# Patient Record
Sex: Male | Born: 1935
Health system: Southern US, Community
[De-identification: ages and names within clinical notes are randomized; demographics above are authoritative.]

## PROBLEM LIST (undated history)

## (undated) ENCOUNTER — Emergency Department (HOSPITAL_BASED_OUTPATIENT_CLINIC_OR_DEPARTMENT_OTHER)

## (undated) DIAGNOSIS — I10 Essential (primary) hypertension: Secondary | ICD-10-CM

## (undated) DIAGNOSIS — I700 Atherosclerosis of aorta: Secondary | ICD-10-CM

## (undated) DIAGNOSIS — E785 Hyperlipidemia, unspecified: Secondary | ICD-10-CM

## (undated) DIAGNOSIS — Z8673 Personal history of transient ischemic attack (TIA), and cerebral infarction without residual deficits: Secondary | ICD-10-CM

## (undated) DIAGNOSIS — H269 Unspecified cataract: Secondary | ICD-10-CM

## (undated) DIAGNOSIS — D510 Vitamin B12 deficiency anemia due to intrinsic factor deficiency: Secondary | ICD-10-CM

## (undated) DIAGNOSIS — I739 Peripheral vascular disease, unspecified: Secondary | ICD-10-CM

## (undated) DIAGNOSIS — Z87442 Personal history of urinary calculi: Secondary | ICD-10-CM

## (undated) DIAGNOSIS — N400 Enlarged prostate without lower urinary tract symptoms: Secondary | ICD-10-CM

## (undated) DIAGNOSIS — I2510 Atherosclerotic heart disease of native coronary artery without angina pectoris: Secondary | ICD-10-CM

## (undated) HISTORY — DX: Hyperlipidemia, unspecified: E78.5

## (undated) HISTORY — PX: CATARACT EXTRACTION W/ INTRAOCULAR LENS IMPLANT: SHX1309

## (undated) HISTORY — DX: Atherosclerosis of aorta: I70.0

## (undated) HISTORY — DX: Unspecified cataract: H26.9

## (undated) HISTORY — DX: Personal history of transient ischemic attack (TIA), and cerebral infarction without residual deficits: Z86.73

## (undated) HISTORY — PX: MELANOMA EXCISION: SHX5266

## (undated) HISTORY — DX: Essential (primary) hypertension: I10

## (undated) HISTORY — PX: CHOLECYSTECTOMY: SHX55

## (undated) HISTORY — DX: Atherosclerotic heart disease of native coronary artery without angina pectoris: I25.10

## (undated) HISTORY — DX: Peripheral vascular disease, unspecified: I73.9

## (undated) HISTORY — DX: Vitamin B12 deficiency anemia due to intrinsic factor deficiency: D51.0

## (undated) HISTORY — PX: CAROTID ARTERY ANGIOPLASTY: SHX1300

## (undated) HISTORY — DX: Benign prostatic hyperplasia without lower urinary tract symptoms: N40.0

---

## 1992-08-23 DIAGNOSIS — H269 Unspecified cataract: Secondary | ICD-10-CM | POA: Insufficient documentation

## 1992-08-23 HISTORY — DX: Unspecified cataract: H26.9

## 2009-08-23 DIAGNOSIS — Z8673 Personal history of transient ischemic attack (TIA), and cerebral infarction without residual deficits: Secondary | ICD-10-CM

## 2009-08-23 HISTORY — DX: Personal history of transient ischemic attack (TIA), and cerebral infarction without residual deficits: Z86.73

## 2010-02-09 ENCOUNTER — Ambulatory Visit: Payer: Self-pay | Admitting: Surgery

## 2010-02-16 ENCOUNTER — Ambulatory Visit: Payer: Self-pay | Admitting: Surgery

## 2010-02-16 ENCOUNTER — Encounter: Admission: RE | Admit: 2010-02-16 | Discharge: 2010-02-16 | Payer: Self-pay | Admitting: Surgery

## 2010-02-20 DIAGNOSIS — I779 Disorder of arteries and arterioles, unspecified: Secondary | ICD-10-CM

## 2010-02-20 HISTORY — DX: Disorder of arteries and arterioles, unspecified: I77.9

## 2010-03-06 ENCOUNTER — Ambulatory Visit: Payer: Self-pay | Admitting: Surgery

## 2010-03-06 ENCOUNTER — Inpatient Hospital Stay (HOSPITAL_COMMUNITY): Admission: RE | Admit: 2010-03-06 | Discharge: 2010-03-07 | Payer: Self-pay | Admitting: Surgery

## 2010-03-06 ENCOUNTER — Encounter: Payer: Self-pay | Admitting: Surgery

## 2010-03-06 HISTORY — PX: CAROTID ENDARTERECTOMY: SUR193

## 2010-03-23 ENCOUNTER — Ambulatory Visit: Payer: Self-pay | Admitting: Surgery

## 2010-03-30 ENCOUNTER — Encounter: Admission: RE | Admit: 2010-03-30 | Discharge: 2010-03-30 | Payer: Self-pay | Admitting: Surgery

## 2010-03-30 ENCOUNTER — Ambulatory Visit: Payer: Self-pay | Admitting: Surgery

## 2010-04-13 ENCOUNTER — Ambulatory Visit: Payer: Self-pay | Admitting: Surgery

## 2010-10-12 ENCOUNTER — Ambulatory Visit: Payer: Self-pay | Admitting: Surgery

## 2010-10-12 ENCOUNTER — Other Ambulatory Visit: Payer: Self-pay

## 2010-10-19 ENCOUNTER — Other Ambulatory Visit: Payer: Self-pay | Admitting: Surgery

## 2010-10-19 ENCOUNTER — Other Ambulatory Visit (INDEPENDENT_AMBULATORY_CARE_PROVIDER_SITE_OTHER): Payer: Medicare Other

## 2010-10-19 ENCOUNTER — Ambulatory Visit (INDEPENDENT_AMBULATORY_CARE_PROVIDER_SITE_OTHER): Payer: Medicare Other | Admitting: Surgery

## 2010-10-19 DIAGNOSIS — Z48812 Encounter for surgical aftercare following surgery on the circulatory system: Secondary | ICD-10-CM

## 2010-10-19 DIAGNOSIS — I6529 Occlusion and stenosis of unspecified carotid artery: Secondary | ICD-10-CM

## 2010-10-19 DIAGNOSIS — R52 Pain, unspecified: Secondary | ICD-10-CM

## 2010-10-20 ENCOUNTER — Other Ambulatory Visit (HOSPITAL_COMMUNITY): Payer: Medicare Other

## 2010-10-20 ENCOUNTER — Inpatient Hospital Stay (HOSPITAL_COMMUNITY): Admission: RE | Admit: 2010-10-20 | Payer: Medicare Other | Source: Ambulatory Visit

## 2010-10-20 NOTE — Assessment & Plan Note (Signed)
OFFICE VISIT  SAGAN, MASELLI DOB:  1933/09/04                                       10/19/2010 [id number]#:[id number]  REASON FOR VISIT:  Followup carotid.  The patient is a 77 year old gentleman who underwent left carotid endarterectomy for symptomatic stenosis.  His postoperative course was uncomplicated.  He comes back in today for followup.  He states that for the past couple of weeks he has been having some numbness in his right hand as well as some pain in his forearm.  The episodes last for a short duration and go away spontaneously.  There are no aggravating factors. They happen almost on a daily occurrence now.  PHYSICAL EXAM:  Vital signs:  Heart rate is 83, blood pressure 145/76 in the right, 125/71 in the left, O2 sat 97%.  General:  Well-appearing, in no distress.  HEENT:  Within normal limits.  Left carotid incision is well-healed.  Respirations:  Nonlabored.  Neurological:  He is intact.  DIAGNOSTIC STUDIES:  Carotid duplex was performed today which shows normal right and left carotid arteries.  ASSESSMENT AND PLAN:  Status post left carotid endarterectomy for symptomatic disease.  The patient's post duplex is widely patent without stenosis.  I am a little troubled by the symptoms he is complaining of in his right arm.  While these are not classic for TIAs it certainly raises a question that could be a potential explanation.  With his left carotid artery widely patent I am going to order a CT scan of his head and neck to make sure there are no potential causative lesions.  I had to do this several months ago and it was negative.  Should this return negative again I would recommend that he see his primary care physician to discuss possible cervical disk disease as the etiology as he says he does have occasional neck pain and this may fit the description of his complaints.  I will call him with the results of the scan.  He is going to be placed  on our carotid surveillance ultrasound.    Jorge Ny, MD Electronically Signed  VWB/MEDQ  D:  10/19/2010  T:  10/20/2010  Job:  [id number]  cc:   Dr Luna Kitchens

## 2010-10-21 ENCOUNTER — Other Ambulatory Visit: Payer: Self-pay | Admitting: Surgery

## 2010-10-21 DIAGNOSIS — I6529 Occlusion and stenosis of unspecified carotid artery: Secondary | ICD-10-CM

## 2010-10-26 ENCOUNTER — Inpatient Hospital Stay: Admission: RE | Admit: 2010-10-26 | Payer: Medicare Other | Source: Ambulatory Visit

## 2010-10-27 NOTE — Procedures (Unsigned)
CAROTID DUPLEX EXAM  INDICATION:  Followup left CEA.  HISTORY: Diabetes:  No. Cardiac:  No. Hypertension:  Yes. Smoking:  No. Previous Surgery:  Left CEA 03/06/2010. CV History:  TIA. Amaurosis Fugax No, Paresthesias No, Hemiparesis No                                      RIGHT             LEFT Brachial systolic pressure:         152               152 Brachial Doppler waveforms:         WNL               WNL Vertebral direction of flow:        Antegrade         Antegrade DUPLEX VELOCITIES (cm/sec) CCA peak systolic                   78                94 ECA peak systolic                   96                101 ICA peak systolic                   53                95 ICA end diastolic                   16                28 PLAQUE MORPHOLOGY:                  Heterogeneous     N/A PLAQUE AMOUNT:                      Minimal           N/A PLAQUE LOCATION:                    ICA               N/A  IMPRESSION: 1. 1% to 39% right internal carotid artery plaquing. 2. Widely patent left carotid endarterectomy with normal post surgical     changes observed. 3. Bilateral vertebral arteries are within normal limits.  ___________________________________________ V. Charlena Cross, MD  LT/MEDQ  D:  10/19/2010  T:  10/19/2010  Job:  [id number]

## 2010-11-07 LAB — CBC
Hemoglobin: 11.4 g/dL — ABNORMAL LOW (ref 13.0–17.0)
MCH: 32 pg (ref 26.0–34.0)
MCHC: 34.3 g/dL (ref 30.0–36.0)
RBC: 3.54 MIL/uL — ABNORMAL LOW (ref 4.22–5.81)
RDW: 12.7 % (ref 11.5–15.5)

## 2010-11-07 LAB — BASIC METABOLIC PANEL
BUN: 10 mg/dL (ref 6–23)
CO2: 29 mEq/L (ref 19–32)
Calcium: 8.3 mg/dL — ABNORMAL LOW (ref 8.4–10.5)
Chloride: 101 mEq/L (ref 96–112)
GFR calc Af Amer: >60 mL/min (ref >60)
Glucose, Bld: 142 mg/dL — ABNORMAL HIGH (ref 70–99)

## 2010-11-08 LAB — CBC
HCT: 42.8 % (ref 39.0–52.0)
MCHC: 34.5 g/dL (ref 30.0–36.0)
MCV: 92.9 fL (ref 78.0–100.0)
RBC: 4.61 MIL/uL (ref 4.22–5.81)
RDW: 13.3 % (ref 11.5–15.5)
WBC: 9.3 10*3/uL (ref 4.0–10.5)

## 2010-11-08 LAB — URINALYSIS, ROUTINE W REFLEX MICROSCOPIC
Glucose, UA: NEGATIVE mg/dL
Nitrite: NEGATIVE
Protein, ur: NEGATIVE mg/dL
Specific Gravity, Urine: 1.007 (ref 1.005–1.030)
Urobilinogen, UA: 0.2 mg/dL (ref 0.0–1.0)

## 2010-11-08 LAB — SURGICAL PCR SCREEN: MRSA, PCR: NEGATIVE

## 2010-11-08 LAB — COMPREHENSIVE METABOLIC PANEL
ALT: 20 U/L (ref 0–53)
Albumin: 4.3 g/dL (ref 3.5–5.2)
CO2: 30 mEq/L (ref 19–32)
Calcium: 9.1 mg/dL (ref 8.4–10.5)
Chloride: 98 mEq/L (ref 96–112)
Creatinine, Ser: 0.91 mg/dL (ref 0.4–1.5)
GFR calc non Af Amer: >60 mL/min (ref >60)
Total Bilirubin: 0.7 mg/dL (ref 0.3–1.2)

## 2010-11-08 LAB — APTT: aPTT: 28 seconds (ref 24–37)

## 2010-11-08 LAB — PROTIME-INR: INR: 0.88 (ref 0.00–1.49)

## 2010-11-08 LAB — ABO/RH: ABO/RH(D): O POS

## 2011-01-05 NOTE — Assessment & Plan Note (Signed)
OFFICE VISIT   Joseph Mcmillan, Joseph Mcmillan  DOB:  1934-06-02                                       02/09/2010  EAVWU#:[id number]   HISTORY:  This is a 77 year old gentleman I am seeing at the request Dr.  Welton Flakes for evaluation of TIA/carotid stenosis.  The patient's history  dates back to 2007 where he had an  episode of right arm weakness.  This  resolved spontaneously.  More recently, however, in May 2011, he  presented to emergency department with acute onset of right hand  numbness and right-sided facial weakness.  A CT scan performed which was  within normal limits.  His symptoms resolved spontaneously but then  recurred.  He had a carotid duplex performed which revealed 60-70% left  carotid stenosis.  There was less than 50% right carotid stenosis.  He  was sent to me for further evaluation.  He denies having any symptoms  since he has been discharged.  No amaurosis, no numbness or weakness.  No slurring of speech.   REVIEW OF SYSTEMS:  Only positive for mini stroke.  All other review  systems are negative.  No chest pain or shortness of breath.   PAST MEDICAL HISTORY:  Hypercholesterolemia, hypertension and benign  prostatic hypertrophy.   FAMILY HISTORY:  Negative for cardiovascular disease at an early age.   DATA REVIEWED:  He has 2 children.  Retired.  Does not smoke or drink.   MEDICATIONS:  Include aspirin 81 mg per day, Diovan HCT 160/12.5 once  daily, Plavix 75 mg per day, simvastatin daily, fish oil 1000 mg daily.   PHYSICAL EXAMINATION:  Heart rate 59, blood pressure 146/71, temperature  is 99.0.  GENERAL:  He is well-appearing, in no distress.  HEENT:  Within normal limits.  LUNGS:  Clear bilaterally.  CARDIOVASCULAR:  Regular rate and rhythm.  No murmur.  No carotid bruit.  ABDOMEN:  Soft, nontender.  MUSCULOSKELETAL:  Without major deformity.  NEURO:  He has no focal deficits.  Cranial nerves II-XII are grossly  intact.  SKIN:  Without rash.   ASSESSMENT/PLAN:  Moderate left carotid stenosis in the setting of left  brain stroke.   PLAN:  The patient has undergone a thorough stroke workup.  The only  thing positive that I can identify this time his carotid stenosis in the  50-70% range.  He certainly falls in the category where I would  recommend carotid endarterectomy.  However, I would also like to fully  evaluate his aortic arch to make sure this is not an aortic plaque that  is embolizing.  I plan on getting a CT angiogram of the chest and neck  to evaluate this and having the patient come back see me next week for  further discussions and plans for operative intervention.     Jorge Ny, MD  Electronically Signed   VWB/MEDQ  D:  02/09/2010  T:  02/10/2010  Job:  [id number]

## 2011-01-05 NOTE — Assessment & Plan Note (Signed)
OFFICE VISIT   EDI, GORNIAK  DOB:  1934/01/13                                       02/16/2010  AVWUJ#:[id number]   Patient comes back in today for further discussions of carotid stenosis.  Briefly he has had a history of right arm weakness back in 2007; however  more recently, he had an episode of right hand numbness and right facial  weakness with slurring of his speech in May 2011.  A head CT was  negative, but carotid Dopplers revealed 60% to 70% left carotid  stenosis.  Diagnosis having had a left brain stroke.  With his Doppler  findings I wanted to get further information about his stenosis;  therefore, I ordered a CT angiogram.  He comes back in today to discuss  the results.  He has not had any symptoms since his visit.   Review of systems are negative as documented in the encounter form  except with a history of mini stroke in May.  He is an avid exercise  person.   PAST MEDICAL HISTORY:  Hypercholesterolemia, hypertension, BPH.   FAMILY HISTORY:  Negative for cardiovascular disease at an early age.   SOCIAL HISTORY:  Negative for tobacco or alcohol.   PHYSICAL EXAMINATION:  Vital signs:  Heart rate 80, blood pressure  116/74, O2 sats 98%.  General:  He is well-appearing in no distress.  HEENT:  Within normal limits.  Lungs are clear bilaterally.  Cardiovascular:  Regular rate and rhythm.  Abdomen:  Soft.  Skin without  rash.  Neuro:  Nonfocal.   DIAGNOSTIC STUDIES:  I have independently reviewed his CT scan.  This  reveals 70% to 75% stenosis in the proximal left internal carotid  artery.  There is mild to minimal plaque in the aortic arch.  The right  side has a stenosis of approximately 30%.   ASSESSMENT:  Symptomatic left carotid stenosis.   PLAN:  I spent an excess of 35 minutes discussing the risks and pro's  and con's of proceeding with the operation.  We discussed the risk of  stroke, nerve injury, cardiopulmonary risks.  The  patient, despite  lengthy conversation, is yet to make a decision.  I have tentatively put  him on the OR schedule for Friday, July 8th, for left carotid  endarterectomy.  He will continue his aspirin and Plavix.  I am going to  get a stress test in [place] for preoperative clearance.     Jorge Ny, MD  Electronically Signed   VWB/MEDQ  D:  02/16/2010  T:  02/17/2010  Job:  [id number]   cc:   Dr. Welton Flakes

## 2011-01-05 NOTE — Assessment & Plan Note (Signed)
OFFICE VISIT   SHAW, Joseph Mcmillan  DOB:  [birth year]                                       04/13/2010  AVWUJ#:[id number]   The patient comes back in today.  He said that he had some drainage from  the distal aspect of his left carotid incision.  It had developed some  redness as well.  He comes in today.  I was able to express a mild  amount of serosanguineous fluid.  The wound was opened approximately 2  mm.  It did not tunnel anywhere.  I evacuated a small amount of old  hematoma.  I placed a piece of gauze into the wound and I am going to  give him a 10 day course of antibiotics.  He will come back to see me in  2 weeks.     Jorge Ny, MD  Electronically Signed   VWB/MEDQ  D:  04/13/2010  T:  04/14/2010  Job:  [phone]

## 2011-01-05 NOTE — Assessment & Plan Note (Signed)
OFFICE VISIT   FRANKEY, BOTTING  DOB:  1934-06-30                                       03/30/2010  EAVWU#:[id number]   This is a 77 year old gentleman status post left carotid endarterectomy  for symptomatic stenosis.  Operative findings include an 80% stenosis  with significant ulcerated plaque.  Patient's postoperative course was  uncomplicated.  He comes back today for followup.   When I saw him last week, he had 1 episode of trouble getting his words  out, which lasted for 1 minute.  He had no other neurologic symptoms.  I  was concerned that this could represent a TIA; therefore, I ordered a  CTA of the head and neck to exclude intracranial disease and aortic arch  disease.  He has not had any further symptoms.  His CTA was negative for  any concerning lesions.  At this point, I am recommending he stay on his  aspirin and Plavix.  I will see him back in 6 months for followup.     Jorge Ny, MD  Electronically Signed   VWB/MEDQ  D:  03/30/2010  T:  03/31/2010  Job:  [id number]   cc:   Dr. Luna Kitchens

## 2011-01-05 NOTE — Assessment & Plan Note (Signed)
OFFICE VISIT   CRUZE, ZINGARO  DOB:  1934/01/01                                       03/23/2010  WJXBJ#:[id number]   The patient comes back today.  He underwent left carotid endarterectomy  on 07/15 for symptomatic stenosis.  Operative findings included an  approximately 80% stenosis with significant ulcerated plaque.  The  patient's postoperative course was relatively uncomplicated, comes back  in today for followup.  He does tell me that he had 1 episode which last  approximately 1 minute where he had trouble getting his words out.  He  has had no other neurologic symptoms.   On physical exam he is neurologically intact.  His incision is well-  healed.   Based on the patient's symptomatology, I feel we need to proceed with  further diagnostic testing.  I am ordering a CT angiogram of his head  and neck mainly to look for intracranial disease.  I am going to have  this done this coming Monday and I will see him afterward.  He will  continue his Plavix.     Jorge Ny, MD  Electronically Signed   VWB/MEDQ  D:  03/23/2010  T:  03/24/2010  Job:  [id number]

## 2011-08-24 DIAGNOSIS — H268 Other specified cataract: Secondary | ICD-10-CM

## 2011-08-24 DIAGNOSIS — Z961 Presence of intraocular lens: Secondary | ICD-10-CM | POA: Insufficient documentation

## 2011-08-24 DIAGNOSIS — H53019 Deprivation amblyopia, unspecified eye: Secondary | ICD-10-CM | POA: Insufficient documentation

## 2011-08-24 DIAGNOSIS — H26491 Other secondary cataract, right eye: Secondary | ICD-10-CM | POA: Insufficient documentation

## 2011-08-24 DIAGNOSIS — H43819 Vitreous degeneration, unspecified eye: Secondary | ICD-10-CM | POA: Insufficient documentation

## 2011-08-24 DIAGNOSIS — H02839 Dermatochalasis of unspecified eye, unspecified eyelid: Secondary | ICD-10-CM | POA: Insufficient documentation

## 2011-08-24 HISTORY — DX: Deprivation amblyopia, unspecified eye: H53.019

## 2011-08-24 HISTORY — DX: Vitreous degeneration, unspecified eye: H43.819

## 2011-08-24 HISTORY — DX: Other secondary cataract, right eye: H26.491

## 2011-08-24 HISTORY — DX: Presence of intraocular lens: Z96.1

## 2011-08-24 HISTORY — DX: Dermatochalasis of unspecified eye, unspecified eyelid: H02.839

## 2011-08-24 HISTORY — DX: Other specified cataract: H26.8

## 2011-10-21 ENCOUNTER — Encounter: Payer: Self-pay | Admitting: Surgery

## 2011-10-22 ENCOUNTER — Encounter: Payer: Self-pay | Admitting: Surgery

## 2011-10-25 ENCOUNTER — Ambulatory Visit (INDEPENDENT_AMBULATORY_CARE_PROVIDER_SITE_OTHER): Payer: Medicare Other | Admitting: Surgery

## 2011-10-25 ENCOUNTER — Other Ambulatory Visit (INDEPENDENT_AMBULATORY_CARE_PROVIDER_SITE_OTHER): Payer: Medicare Other | Admitting: *Deleted

## 2011-10-25 DIAGNOSIS — I6529 Occlusion and stenosis of unspecified carotid artery: Secondary | ICD-10-CM

## 2011-10-25 DIAGNOSIS — Z48812 Encounter for surgical aftercare following surgery on the circulatory system: Secondary | ICD-10-CM

## 2011-11-01 ENCOUNTER — Other Ambulatory Visit: Payer: Self-pay | Admitting: *Deleted

## 2011-11-01 ENCOUNTER — Encounter: Payer: Self-pay | Admitting: Surgery

## 2011-11-01 DIAGNOSIS — Z48812 Encounter for surgical aftercare following surgery on the circulatory system: Secondary | ICD-10-CM

## 2011-11-01 DIAGNOSIS — I6529 Occlusion and stenosis of unspecified carotid artery: Secondary | ICD-10-CM

## 2011-11-01 NOTE — Procedures (Unsigned)
CAROTID DUPLEX EXAM  INDICATION:  Followup left carotid endarterectomy.  HISTORY: Diabetes:  No Cardiac:  No Hypertension:  Yes Smoking:  No Previous Surgery:  Left carotid endarterectomy 03/06/2010 CV History:  TIA, current asymptomatic. Amaurosis Fugax: No. Paresthesias: No. Hemiparesis: No.                                      RIGHT             LEFT Brachial systolic pressure:         120               119 Brachial Doppler waveforms:         Normal            Normal Vertebral direction of flow:        Antegrade         Antegrade DUPLEX VELOCITIES (cm/sec) CCA peak systolic                   68                82 ECA peak systolic                   105               90 ICA peak systolic                   83                61 ICA end diastolic                   36                25 PLAQUE MORPHOLOGY:                  Heterogenous PLAQUE AMOUNT:                      Minimal           None PLAQUE LOCATION:                    ICA  IMPRESSION: 1. Right ICA velocity suggests 1-39% stenosis. 2. Patent left CEA site with no evidence of restenosis of the ICA. 3. Antegrade vertebral arteries bilaterally.  ___________________________________________ V. Charlena Cross, MD  EM/MEDQ  D:  10/25/2011  T:  10/25/2011  Job:  [id number]

## 2011-11-01 NOTE — Procedures (Unsigned)
CAROTID DUPLEX EXAM  INDICATION:  Followup left CEA.  HISTORY: Diabetes:  no Cardiac:  no Hypertension:  yes Smoking:  no Previous Surgery:  Left CEA 03/06/2010 CV History:  History of TIA, currently asymptomatic Amaurosis Fugax No, Paresthesias No, Hemiparesis No                                      RIGHT             LEFT Brachial systolic pressure:         120               119 Brachial Doppler waveforms:         Normal            Normal Vertebral direction of flow:        Antegrade         Antegrade DUPLEX VELOCITIES (cm/sec) CCA peak systolic                   68                82 ECA peak systolic                   105               90 ICA peak systolic                   83                61 ICA end diastolic                   36                25 PLAQUE MORPHOLOGY:                  Heterogenous PLAQUE AMOUNT:                      Minimal           None PLAQUE LOCATION:                    ICA  IMPRESSION:  Right ICA velocity suggests 1% to 39% stenosis.  Patent left carotid endarterectomy site with no evidence of restenosis of the ICA.  Antegrade vertebral arteries bilaterally.  ___________________________________________ V. Charlena Cross, MD  EM/MEDQ  D:  10/29/2011  T:  10/29/2011  Job:  [id number]

## 2012-10-30 ENCOUNTER — Encounter: Payer: Self-pay | Admitting: Surgery

## 2012-10-30 ENCOUNTER — Other Ambulatory Visit (INDEPENDENT_AMBULATORY_CARE_PROVIDER_SITE_OTHER): Payer: Medicare Other | Admitting: *Deleted

## 2012-10-30 ENCOUNTER — Ambulatory Visit (INDEPENDENT_AMBULATORY_CARE_PROVIDER_SITE_OTHER): Payer: Medicare Other | Admitting: Surgery

## 2012-10-30 DIAGNOSIS — I6529 Occlusion and stenosis of unspecified carotid artery: Secondary | ICD-10-CM

## 2012-10-30 DIAGNOSIS — Z48812 Encounter for surgical aftercare following surgery on the circulatory system: Secondary | ICD-10-CM

## 2012-10-30 HISTORY — DX: Occlusion and stenosis of unspecified carotid artery: I65.29

## 2012-10-30 NOTE — Progress Notes (Signed)
[facility]   Patient name: Joseph Mcmillan MRN: [id number] DOB: 1934/05/30 Sex: male     Chief Complaint  Patient presents with  . Carotid    1 year f/u     HISTORY OF PRESENT ILLNESS: The patient is back today for followup. He is status post left carotid endarterectomy on 03/06/2010. This was done in the setting of symptomatic left carotid stenosis. Prior to his surgery, he had had several episodes consistent with a TIA. These included right facial weakness and slurred speech. Since his operation, he has not had any further neurologic issues. He denies numbness or weakness in either extremity. He denies slurred speech. He denies amaurosis fugax. He continues to work part-time.  Past Medical History  Diagnosis Date  . Hypertension   . Hyperlipidemia   . Stroke     History of TIA  . CAD (coronary artery disease)   . BPH (benign prostatic hypertrophy)     Past Surgical History  Procedure Laterality Date  . Carotid endarterectomy  03-06-2010    Left CEA    History   Social History  . Marital Status: Divorced    Spouse Name: N/A    Number of Children: N/A  . Years of Education: N/A   Occupational History  . Not on file.   Social History Main Topics  . Smoking status: Never Smoker   . Smokeless tobacco: Never Used  . Alcohol Use: No  . Drug Use: No  . Sexually Active: Not on file   Other Topics Concern  . Not on file   Social History Narrative  . No narrative on file    Family History  Problem Relation Age of Onset  . Cancer Mother     Allergies as of 10/30/2012 - Review Complete 10/30/2012  Allergen Reaction Noted  . Codeine  10/21/2011    Current Outpatient Prescriptions on File Prior to Visit  Medication Sig Dispense Refill  . aspirin 81 MG tablet Take 81 mg by mouth daily.      . clopidogrel (PLAVIX) 75 MG tablet Take 75 mg by mouth daily.      . simvastatin (ZOCOR) 40 MG tablet Take 40 mg by mouth every evening.      .  valsartan-hydrochlorothiazide (DIOVAN-HCT) 160-12.5 MG per tablet Take 1 tablet by mouth daily.      . fish oil-omega-3 fatty acids 1000 MG capsule Take 1 g by mouth daily.       No current facility-administered medications on file prior to visit.     REVIEW OF SYSTEMS: Please see history of present illness, otherwise all systems are negative  PHYSICAL EXAMINATION:   Vital signs are BP 143/79  Pulse 72  Ht 5\' 11"  (1.803 m)  Wt 156 lb (70.761 kg)  BMI 21.77 kg/m2  SpO2 100% General: The patient appears their stated age. HEENT:  No gross abnormalities Pulmonary:  Non labored breathing Abdomen: Soft and non-tender. No pulsatile mass appreciated Musculoskeletal: There are no major deformities. Neurologic: No focal weakness or paresthesias are detected, Skin: There are no ulcer or rashes noted. Psychiatric: The patient has normal affect. Cardiovascular: There is a regular rate and rhythm without significant murmur appreciated. No carotid bruit   Diagnostic Studies  duplex ultrasound was ordered and reviewed. This shows less than 40% stenosis of the right carotid artery a widely patent left carotid endarterectomy site without stenosis  Assessment: Status post left carotid endarterectomy for symptomatic stenosis Plan: The patient is doing  very well at this time. He remained asymptomatic. Ultrasound shows a widely patent endarterectomy site. He will continue with routine ultrasound surveillance. His next study will be in one year  V. Charlena Cross, M.D. [facility] [employer] Office: [phone] Pager:  [phone]

## 2012-11-21 ENCOUNTER — Other Ambulatory Visit: Payer: Self-pay | Admitting: *Deleted

## 2013-10-29 ENCOUNTER — Other Ambulatory Visit (HOSPITAL_COMMUNITY): Payer: Medicare Other

## 2013-10-29 ENCOUNTER — Ambulatory Visit: Payer: Medicare Other | Admitting: Family

## 2013-10-30 ENCOUNTER — Other Ambulatory Visit: Payer: Medicare Other

## 2013-10-30 ENCOUNTER — Ambulatory Visit: Payer: Medicare Other | Admitting: Neurosurgery

## 2014-04-17 ENCOUNTER — Encounter: Payer: Self-pay | Admitting: Gastroenterology

## 2014-04-17 HISTORY — PX: COLONOSCOPY: SHX174

## 2014-09-24 DIAGNOSIS — L853 Xerosis cutis: Secondary | ICD-10-CM | POA: Diagnosis not present

## 2014-09-24 DIAGNOSIS — B079 Viral wart, unspecified: Secondary | ICD-10-CM | POA: Diagnosis not present

## 2014-09-24 DIAGNOSIS — L570 Actinic keratosis: Secondary | ICD-10-CM | POA: Diagnosis not present

## 2014-09-24 DIAGNOSIS — L578 Other skin changes due to chronic exposure to nonionizing radiation: Secondary | ICD-10-CM | POA: Diagnosis not present

## 2014-09-24 DIAGNOSIS — C4339 Malignant melanoma of other parts of face: Secondary | ICD-10-CM | POA: Diagnosis not present

## 2014-10-11 DIAGNOSIS — I10 Essential (primary) hypertension: Secondary | ICD-10-CM | POA: Diagnosis not present

## 2014-10-11 DIAGNOSIS — E119 Type 2 diabetes mellitus without complications: Secondary | ICD-10-CM | POA: Diagnosis not present

## 2015-04-03 DIAGNOSIS — M5413 Radiculopathy, cervicothoracic region: Secondary | ICD-10-CM | POA: Diagnosis not present

## 2015-04-03 DIAGNOSIS — M9902 Segmental and somatic dysfunction of thoracic region: Secondary | ICD-10-CM | POA: Diagnosis not present

## 2015-04-03 DIAGNOSIS — M9901 Segmental and somatic dysfunction of cervical region: Secondary | ICD-10-CM | POA: Diagnosis not present

## 2015-04-03 DIAGNOSIS — M9903 Segmental and somatic dysfunction of lumbar region: Secondary | ICD-10-CM | POA: Diagnosis not present

## 2015-04-08 DIAGNOSIS — M9901 Segmental and somatic dysfunction of cervical region: Secondary | ICD-10-CM | POA: Diagnosis not present

## 2015-04-08 DIAGNOSIS — M9902 Segmental and somatic dysfunction of thoracic region: Secondary | ICD-10-CM | POA: Diagnosis not present

## 2015-04-08 DIAGNOSIS — M9903 Segmental and somatic dysfunction of lumbar region: Secondary | ICD-10-CM | POA: Diagnosis not present

## 2015-04-08 DIAGNOSIS — M5413 Radiculopathy, cervicothoracic region: Secondary | ICD-10-CM | POA: Diagnosis not present

## 2015-04-16 DIAGNOSIS — R5383 Other fatigue: Secondary | ICD-10-CM | POA: Diagnosis not present

## 2015-04-22 DIAGNOSIS — M9903 Segmental and somatic dysfunction of lumbar region: Secondary | ICD-10-CM | POA: Diagnosis not present

## 2015-04-22 DIAGNOSIS — M9902 Segmental and somatic dysfunction of thoracic region: Secondary | ICD-10-CM | POA: Diagnosis not present

## 2015-04-22 DIAGNOSIS — M9901 Segmental and somatic dysfunction of cervical region: Secondary | ICD-10-CM | POA: Diagnosis not present

## 2015-04-22 DIAGNOSIS — M5413 Radiculopathy, cervicothoracic region: Secondary | ICD-10-CM | POA: Diagnosis not present

## 2015-05-20 DIAGNOSIS — M5413 Radiculopathy, cervicothoracic region: Secondary | ICD-10-CM | POA: Diagnosis not present

## 2015-05-20 DIAGNOSIS — M9902 Segmental and somatic dysfunction of thoracic region: Secondary | ICD-10-CM | POA: Diagnosis not present

## 2015-05-20 DIAGNOSIS — M9903 Segmental and somatic dysfunction of lumbar region: Secondary | ICD-10-CM | POA: Diagnosis not present

## 2015-05-20 DIAGNOSIS — M9901 Segmental and somatic dysfunction of cervical region: Secondary | ICD-10-CM | POA: Diagnosis not present

## 2015-05-26 DIAGNOSIS — B079 Viral wart, unspecified: Secondary | ICD-10-CM | POA: Diagnosis not present

## 2015-05-26 DIAGNOSIS — L570 Actinic keratosis: Secondary | ICD-10-CM | POA: Diagnosis not present

## 2015-05-26 DIAGNOSIS — L578 Other skin changes due to chronic exposure to nonionizing radiation: Secondary | ICD-10-CM | POA: Diagnosis not present

## 2015-05-26 DIAGNOSIS — L821 Other seborrheic keratosis: Secondary | ICD-10-CM | POA: Diagnosis not present

## 2015-05-28 DIAGNOSIS — Z23 Encounter for immunization: Secondary | ICD-10-CM | POA: Diagnosis not present

## 2015-09-17 DIAGNOSIS — R202 Paresthesia of skin: Secondary | ICD-10-CM | POA: Diagnosis not present

## 2015-09-17 DIAGNOSIS — R112 Nausea with vomiting, unspecified: Secondary | ICD-10-CM | POA: Diagnosis not present

## 2015-10-10 DIAGNOSIS — H527 Unspecified disorder of refraction: Secondary | ICD-10-CM | POA: Diagnosis not present

## 2015-10-10 DIAGNOSIS — Z961 Presence of intraocular lens: Secondary | ICD-10-CM | POA: Diagnosis not present

## 2015-10-10 DIAGNOSIS — H02836 Dermatochalasis of left eye, unspecified eyelid: Secondary | ICD-10-CM | POA: Diagnosis not present

## 2015-10-10 DIAGNOSIS — Z885 Allergy status to narcotic agent status: Secondary | ICD-10-CM | POA: Diagnosis not present

## 2015-10-10 DIAGNOSIS — Z7982 Long term (current) use of aspirin: Secondary | ICD-10-CM | POA: Diagnosis not present

## 2015-10-10 DIAGNOSIS — H5442 Blindness, left eye, normal vision right eye: Secondary | ICD-10-CM | POA: Diagnosis not present

## 2015-10-10 DIAGNOSIS — H509 Unspecified strabismus: Secondary | ICD-10-CM | POA: Diagnosis not present

## 2015-10-10 DIAGNOSIS — H43811 Vitreous degeneration, right eye: Secondary | ICD-10-CM | POA: Diagnosis not present

## 2015-10-10 DIAGNOSIS — H269 Unspecified cataract: Secondary | ICD-10-CM | POA: Diagnosis not present

## 2015-10-10 DIAGNOSIS — H26491 Other secondary cataract, right eye: Secondary | ICD-10-CM | POA: Diagnosis not present

## 2015-10-10 DIAGNOSIS — H53012 Deprivation amblyopia, left eye: Secondary | ICD-10-CM | POA: Diagnosis not present

## 2015-10-10 DIAGNOSIS — H02833 Dermatochalasis of right eye, unspecified eyelid: Secondary | ICD-10-CM | POA: Diagnosis not present

## 2015-10-10 DIAGNOSIS — H43813 Vitreous degeneration, bilateral: Secondary | ICD-10-CM | POA: Diagnosis not present

## 2015-10-10 DIAGNOSIS — Z79899 Other long term (current) drug therapy: Secondary | ICD-10-CM | POA: Diagnosis not present

## 2015-10-10 DIAGNOSIS — I10 Essential (primary) hypertension: Secondary | ICD-10-CM | POA: Diagnosis not present

## 2015-11-06 DIAGNOSIS — Z79899 Other long term (current) drug therapy: Secondary | ICD-10-CM | POA: Diagnosis not present

## 2015-11-06 DIAGNOSIS — E782 Mixed hyperlipidemia: Secondary | ICD-10-CM | POA: Diagnosis not present

## 2015-11-06 DIAGNOSIS — E1165 Type 2 diabetes mellitus with hyperglycemia: Secondary | ICD-10-CM | POA: Diagnosis not present

## 2015-11-14 DIAGNOSIS — E1165 Type 2 diabetes mellitus with hyperglycemia: Secondary | ICD-10-CM | POA: Diagnosis not present

## 2015-11-14 DIAGNOSIS — I10 Essential (primary) hypertension: Secondary | ICD-10-CM | POA: Diagnosis not present

## 2015-11-14 DIAGNOSIS — Z0000 Encounter for general adult medical examination without abnormal findings: Secondary | ICD-10-CM | POA: Diagnosis not present

## 2015-11-14 DIAGNOSIS — Z8673 Personal history of transient ischemic attack (TIA), and cerebral infarction without residual deficits: Secondary | ICD-10-CM | POA: Diagnosis not present

## 2015-11-14 DIAGNOSIS — E782 Mixed hyperlipidemia: Secondary | ICD-10-CM | POA: Diagnosis not present

## 2015-12-09 DIAGNOSIS — L570 Actinic keratosis: Secondary | ICD-10-CM | POA: Diagnosis not present

## 2016-06-07 DIAGNOSIS — Z23 Encounter for immunization: Secondary | ICD-10-CM | POA: Diagnosis not present

## 2016-06-09 DIAGNOSIS — L821 Other seborrheic keratosis: Secondary | ICD-10-CM | POA: Diagnosis not present

## 2016-06-09 DIAGNOSIS — C4339 Malignant melanoma of other parts of face: Secondary | ICD-10-CM | POA: Diagnosis not present

## 2016-06-09 DIAGNOSIS — L570 Actinic keratosis: Secondary | ICD-10-CM | POA: Diagnosis not present

## 2016-06-09 DIAGNOSIS — L578 Other skin changes due to chronic exposure to nonionizing radiation: Secondary | ICD-10-CM | POA: Diagnosis not present

## 2016-06-15 DIAGNOSIS — I739 Peripheral vascular disease, unspecified: Secondary | ICD-10-CM | POA: Diagnosis not present

## 2016-06-15 DIAGNOSIS — H02836 Dermatochalasis of left eye, unspecified eyelid: Secondary | ICD-10-CM | POA: Diagnosis not present

## 2016-06-15 DIAGNOSIS — H269 Unspecified cataract: Secondary | ICD-10-CM | POA: Diagnosis not present

## 2016-06-15 DIAGNOSIS — H02833 Dermatochalasis of right eye, unspecified eyelid: Secondary | ICD-10-CM | POA: Diagnosis not present

## 2016-06-15 DIAGNOSIS — H53012 Deprivation amblyopia, left eye: Secondary | ICD-10-CM | POA: Diagnosis not present

## 2016-06-15 DIAGNOSIS — Z961 Presence of intraocular lens: Secondary | ICD-10-CM | POA: Diagnosis not present

## 2016-06-15 DIAGNOSIS — H26491 Other secondary cataract, right eye: Secondary | ICD-10-CM | POA: Diagnosis not present

## 2016-06-15 DIAGNOSIS — H43813 Vitreous degeneration, bilateral: Secondary | ICD-10-CM | POA: Diagnosis not present

## 2016-06-15 DIAGNOSIS — H527 Unspecified disorder of refraction: Secondary | ICD-10-CM | POA: Diagnosis not present

## 2016-11-10 DIAGNOSIS — D539 Nutritional anemia, unspecified: Secondary | ICD-10-CM | POA: Diagnosis not present

## 2016-11-10 DIAGNOSIS — Z85820 Personal history of malignant melanoma of skin: Secondary | ICD-10-CM | POA: Diagnosis not present

## 2016-11-10 DIAGNOSIS — I10 Essential (primary) hypertension: Secondary | ICD-10-CM | POA: Diagnosis not present

## 2016-11-10 DIAGNOSIS — R5383 Other fatigue: Secondary | ICD-10-CM | POA: Diagnosis not present

## 2016-11-10 DIAGNOSIS — E119 Type 2 diabetes mellitus without complications: Secondary | ICD-10-CM | POA: Diagnosis not present

## 2016-11-10 DIAGNOSIS — L570 Actinic keratosis: Secondary | ICD-10-CM | POA: Diagnosis not present

## 2016-11-10 DIAGNOSIS — L578 Other skin changes due to chronic exposure to nonionizing radiation: Secondary | ICD-10-CM | POA: Diagnosis not present

## 2016-11-10 DIAGNOSIS — Z8673 Personal history of transient ischemic attack (TIA), and cerebral infarction without residual deficits: Secondary | ICD-10-CM | POA: Diagnosis not present

## 2016-11-10 DIAGNOSIS — C4441 Basal cell carcinoma of skin of scalp and neck: Secondary | ICD-10-CM | POA: Diagnosis not present

## 2016-11-10 DIAGNOSIS — L821 Other seborrheic keratosis: Secondary | ICD-10-CM | POA: Diagnosis not present

## 2016-11-22 DIAGNOSIS — D539 Nutritional anemia, unspecified: Secondary | ICD-10-CM | POA: Diagnosis not present

## 2016-11-22 DIAGNOSIS — Z131 Encounter for screening for diabetes mellitus: Secondary | ICD-10-CM | POA: Diagnosis not present

## 2016-11-22 DIAGNOSIS — E782 Mixed hyperlipidemia: Secondary | ICD-10-CM | POA: Diagnosis not present

## 2016-11-22 DIAGNOSIS — Z79899 Other long term (current) drug therapy: Secondary | ICD-10-CM | POA: Diagnosis not present

## 2016-12-01 DIAGNOSIS — Z6823 Body mass index (BMI) 23.0-23.9, adult: Secondary | ICD-10-CM | POA: Diagnosis not present

## 2016-12-01 DIAGNOSIS — Z9181 History of falling: Secondary | ICD-10-CM | POA: Diagnosis not present

## 2016-12-01 DIAGNOSIS — E538 Deficiency of other specified B group vitamins: Secondary | ICD-10-CM | POA: Diagnosis not present

## 2016-12-01 DIAGNOSIS — Z1389 Encounter for screening for other disorder: Secondary | ICD-10-CM | POA: Diagnosis not present

## 2016-12-08 DIAGNOSIS — D510 Vitamin B12 deficiency anemia due to intrinsic factor deficiency: Secondary | ICD-10-CM | POA: Diagnosis not present

## 2016-12-15 DIAGNOSIS — D510 Vitamin B12 deficiency anemia due to intrinsic factor deficiency: Secondary | ICD-10-CM | POA: Diagnosis not present

## 2016-12-24 DIAGNOSIS — D510 Vitamin B12 deficiency anemia due to intrinsic factor deficiency: Secondary | ICD-10-CM | POA: Diagnosis not present

## 2017-01-19 DIAGNOSIS — Z85820 Personal history of malignant melanoma of skin: Secondary | ICD-10-CM | POA: Diagnosis not present

## 2017-01-19 DIAGNOSIS — B079 Viral wart, unspecified: Secondary | ICD-10-CM | POA: Diagnosis not present

## 2017-01-19 DIAGNOSIS — L570 Actinic keratosis: Secondary | ICD-10-CM | POA: Diagnosis not present

## 2017-01-19 DIAGNOSIS — C4441 Basal cell carcinoma of skin of scalp and neck: Secondary | ICD-10-CM | POA: Diagnosis not present

## 2017-01-19 DIAGNOSIS — L578 Other skin changes due to chronic exposure to nonionizing radiation: Secondary | ICD-10-CM | POA: Diagnosis not present

## 2017-01-25 DIAGNOSIS — E538 Deficiency of other specified B group vitamins: Secondary | ICD-10-CM | POA: Diagnosis not present

## 2017-03-01 DIAGNOSIS — H53012 Deprivation amblyopia, left eye: Secondary | ICD-10-CM | POA: Diagnosis not present

## 2017-03-01 DIAGNOSIS — H26491 Other secondary cataract, right eye: Secondary | ICD-10-CM | POA: Diagnosis not present

## 2017-03-01 DIAGNOSIS — H02833 Dermatochalasis of right eye, unspecified eyelid: Secondary | ICD-10-CM | POA: Diagnosis not present

## 2017-03-01 DIAGNOSIS — H524 Presbyopia: Secondary | ICD-10-CM

## 2017-03-01 DIAGNOSIS — H269 Unspecified cataract: Secondary | ICD-10-CM | POA: Diagnosis not present

## 2017-03-01 HISTORY — DX: Presbyopia: H52.4

## 2017-03-09 DIAGNOSIS — E538 Deficiency of other specified B group vitamins: Secondary | ICD-10-CM | POA: Diagnosis not present

## 2017-03-23 DIAGNOSIS — Z6823 Body mass index (BMI) 23.0-23.9, adult: Secondary | ICD-10-CM | POA: Diagnosis not present

## 2017-03-23 DIAGNOSIS — E538 Deficiency of other specified B group vitamins: Secondary | ICD-10-CM | POA: Diagnosis not present

## 2017-03-23 DIAGNOSIS — R5383 Other fatigue: Secondary | ICD-10-CM | POA: Diagnosis not present

## 2017-03-23 DIAGNOSIS — E119 Type 2 diabetes mellitus without complications: Secondary | ICD-10-CM | POA: Diagnosis not present

## 2017-03-23 DIAGNOSIS — I10 Essential (primary) hypertension: Secondary | ICD-10-CM | POA: Diagnosis not present

## 2017-04-19 DIAGNOSIS — L820 Inflamed seborrheic keratosis: Secondary | ICD-10-CM | POA: Diagnosis not present

## 2017-04-19 DIAGNOSIS — L578 Other skin changes due to chronic exposure to nonionizing radiation: Secondary | ICD-10-CM | POA: Diagnosis not present

## 2017-04-19 DIAGNOSIS — L570 Actinic keratosis: Secondary | ICD-10-CM | POA: Diagnosis not present

## 2017-04-19 DIAGNOSIS — L821 Other seborrheic keratosis: Secondary | ICD-10-CM | POA: Diagnosis not present

## 2017-04-19 DIAGNOSIS — C4441 Basal cell carcinoma of skin of scalp and neck: Secondary | ICD-10-CM | POA: Diagnosis not present

## 2017-05-23 DIAGNOSIS — H01001 Unspecified blepharitis right upper eyelid: Secondary | ICD-10-CM | POA: Diagnosis not present

## 2017-05-23 DIAGNOSIS — H01004 Unspecified blepharitis left upper eyelid: Secondary | ICD-10-CM | POA: Diagnosis not present

## 2017-05-23 DIAGNOSIS — H16223 Keratoconjunctivitis sicca, not specified as Sjogren's, bilateral: Secondary | ICD-10-CM | POA: Diagnosis not present

## 2017-05-23 DIAGNOSIS — H11823 Conjunctivochalasis, bilateral: Secondary | ICD-10-CM | POA: Diagnosis not present

## 2017-05-25 DIAGNOSIS — Z23 Encounter for immunization: Secondary | ICD-10-CM | POA: Diagnosis not present

## 2017-05-25 DIAGNOSIS — R0609 Other forms of dyspnea: Secondary | ICD-10-CM | POA: Diagnosis not present

## 2017-05-25 DIAGNOSIS — I10 Essential (primary) hypertension: Secondary | ICD-10-CM | POA: Diagnosis not present

## 2017-05-25 DIAGNOSIS — Z6823 Body mass index (BMI) 23.0-23.9, adult: Secondary | ICD-10-CM | POA: Diagnosis not present

## 2017-05-25 DIAGNOSIS — D510 Vitamin B12 deficiency anemia due to intrinsic factor deficiency: Secondary | ICD-10-CM | POA: Diagnosis not present

## 2017-06-16 DIAGNOSIS — W57XXXA Bitten or stung by nonvenomous insect and other nonvenomous arthropods, initial encounter: Secondary | ICD-10-CM | POA: Diagnosis not present

## 2017-06-16 DIAGNOSIS — J0100 Acute maxillary sinusitis, unspecified: Secondary | ICD-10-CM | POA: Diagnosis not present

## 2017-06-16 DIAGNOSIS — Z6823 Body mass index (BMI) 23.0-23.9, adult: Secondary | ICD-10-CM | POA: Diagnosis not present

## 2017-07-04 DIAGNOSIS — I10 Essential (primary) hypertension: Secondary | ICD-10-CM | POA: Diagnosis not present

## 2017-07-04 DIAGNOSIS — R0609 Other forms of dyspnea: Secondary | ICD-10-CM | POA: Diagnosis not present

## 2017-07-04 DIAGNOSIS — Z6824 Body mass index (BMI) 24.0-24.9, adult: Secondary | ICD-10-CM | POA: Diagnosis not present

## 2017-07-21 DIAGNOSIS — Z85820 Personal history of malignant melanoma of skin: Secondary | ICD-10-CM | POA: Diagnosis not present

## 2017-07-21 DIAGNOSIS — L570 Actinic keratosis: Secondary | ICD-10-CM | POA: Diagnosis not present

## 2017-07-21 DIAGNOSIS — C4441 Basal cell carcinoma of skin of scalp and neck: Secondary | ICD-10-CM | POA: Diagnosis not present

## 2017-07-22 ENCOUNTER — Other Ambulatory Visit: Payer: Self-pay

## 2017-07-26 ENCOUNTER — Encounter: Payer: Self-pay | Admitting: Cardiology

## 2017-07-26 ENCOUNTER — Ambulatory Visit: Payer: Medicare Other | Admitting: Cardiology

## 2017-07-26 VITALS — BP 136/62 | HR 69 | Ht 70.0 in | Wt 158.0 lb

## 2017-07-26 DIAGNOSIS — Z01818 Encounter for other preprocedural examination: Secondary | ICD-10-CM | POA: Diagnosis not present

## 2017-07-26 DIAGNOSIS — Z9889 Other specified postprocedural states: Secondary | ICD-10-CM | POA: Diagnosis not present

## 2017-07-26 DIAGNOSIS — R29898 Other symptoms and signs involving the musculoskeletal system: Secondary | ICD-10-CM | POA: Diagnosis not present

## 2017-07-26 DIAGNOSIS — I10 Essential (primary) hypertension: Secondary | ICD-10-CM | POA: Diagnosis not present

## 2017-07-26 DIAGNOSIS — T733XXA Exhaustion due to excessive exertion, initial encounter: Secondary | ICD-10-CM | POA: Diagnosis not present

## 2017-07-26 DIAGNOSIS — E785 Hyperlipidemia, unspecified: Secondary | ICD-10-CM | POA: Diagnosis not present

## 2017-07-26 DIAGNOSIS — I6522 Occlusion and stenosis of left carotid artery: Secondary | ICD-10-CM | POA: Diagnosis not present

## 2017-07-26 DIAGNOSIS — I2510 Atherosclerotic heart disease of native coronary artery without angina pectoris: Secondary | ICD-10-CM | POA: Diagnosis not present

## 2017-07-26 HISTORY — DX: Hyperlipidemia, unspecified: E78.5

## 2017-07-26 HISTORY — DX: Other symptoms and signs involving the musculoskeletal system: R29.898

## 2017-07-26 HISTORY — DX: Exhaustion due to excessive exertion, initial encounter: T73.3XXA

## 2017-07-26 HISTORY — DX: Essential (primary) hypertension: I10

## 2017-07-26 HISTORY — DX: Other specified postprocedural states: Z98.890

## 2017-07-26 HISTORY — DX: Occlusion and stenosis of left carotid artery: I65.22

## 2017-07-26 MED ORDER — METOPROLOL TARTRATE 50 MG PO TABS: 50.0000 mg | ORAL_TABLET | Freq: Once | ORAL | 0 refills | Status: DC

## 2017-07-26 NOTE — Progress Notes (Signed)
PCP: Mateo Flow, MD  Clinic Note: Chief Complaint  Patient presents with  . New Patient (Initial Visit)    Fatigue  . Carotid  . Extremity Weakness    HPI: Joseph Mcmillan is a 83 y.o. male who is being seen today for the evaluation of worsening fatigue/exertional fatigue at the request of Mateo Flow, MD   He has a history of left carotid artery disease status post carotid endarterectomy in July 2011.  This was done in the setting of symptomatic left carotid stenosis --notably sinceseveral episodes of TIA with right-sided facial weakness and slurred speech.  Last seen by Dr. Trula Slade in March 2014. His chart indicates that he has coronary artery disease, however that I cannot find any documentation of this - just coronary calcification on CT chest.    KHALEB BROZ was last seen on November 12 by Dr. Humphrey Rolls --he noted fatigue and weakness is gradually been getting worse over the last month or so.  --He was referred for cardiology evaluation thinking that this could be ischemic related.  He was given a prescription for nitroglycerin when she has not used.  Recent Hospitalizations: None  Studies Personally Reviewed - (if available, images/films reviewed: From Epic Chart or Care Everywhere)  April 2014: Last carotid Doppler showed less than 40% right carotid stenosis.  No stenosis of the left posterior carotid endarterectomy  Interval History: [name] indicates that when he comes in today he is feeling notably better than he did when he was seeing his PCP.  He still does not feel quite right now.  He describes the symptoms  noted back when he was seeing his PCP as follows:It usually is more exertional, and he knows.  He he says his driveway is about 1/4 mile and he usually is able to walk back and forth, but now he gets part of the way back in his legs just feel as though they can give out on him he then starts getting a little bit short of breath.  He has not noted any chest tightness or  pressure however.  He does get short of breath.  He tells me that his legs ache a little bit and walking but do not necessarily hurt.  He does tell me he has been feeling a little bit better since he saw his PCP.  Apparently he has been treated for low vitamin B12, and notes that his energy level has improved.  He does in general says he has not felt like himself though. He had one episode just after seeing his PCP where he felt like he suddenly felt flushed and hot.  He felt like he was going to pass out but never actually did.  He denies feeling any sensation of chest tightness and pressure but has an uneasy sensation when he feels the fatigue.  He denies any PND, orthopnea or edema. With exception of the one near syncopal episode, he said any syncope or near syncope.  No TIA or amaurosis fugax. He denies any rapid irregular heartbeats or palpitations.  No bleeding issues of melena, hematochezia or hematuria or epistaxi  ROS: A comprehensive was performed. Review of Systems  Constitutional: Positive for malaise/fatigue (As noted in HPI).  HENT: Negative for congestion and sinus pain.   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  Respiratory: Negative for cough and wheezing.   Gastrointestinal: Positive for nausea and  vomiting. Negative for constipation.       He has several episodes maybe once or twice a week for he will all of a sudden get nauseated and vomit.  This lasts a for short while, and then he feels better after he vomits.  Musculoskeletal: Positive for joint pain (Arthritis pains).  Neurological: Negative for loss of consciousness (One particular episode where he felt like he had near syncope).  Psychiatric/Behavioral: Negative for memory loss. The patient is not nervous/anxious and does not have insomnia.   All other systems reviewed and are negative.  I have reviewed and (if needed) personally updated the patient's problem  list, medications, allergies, past medical and surgical history, social and family history.   Past Medical History:  Diagnosis Date  . Aortic calcification (HCC)    Seen on CT scan  . BPH (benign prostatic hypertrophy)   . CAD (coronary artery disease)    Coronary artery calcification noted on CT scan  . Cataract, right eye 1994   Extracted with lens implant  . History of TIAs 2011   Status post left carotid endarterectomy  . Hyperlipidemia   . Hypertension   . Left-sided carotid artery disease ([place]) 02/2010   Left carotid endarterectomy  . Pernicious anemia     Past Surgical History:  Procedure Laterality Date  . CAROTID ARTERY ANGIOPLASTY Left   . CAROTID ENDARTERECTOMY  03-06-2010   Left CEA  . CATARACT EXTRACTION W/ INTRAOCULAR LENS IMPLANT Right   . CHOLECYSTECTOMY    . MELANOMA EXCISION      No outpatient medications have been marked as taking for the 07/26/17 encounter (Office Visit) with Leonie Man, MD.    Allergies  Allergen Reactions  . Codeine     Social History   Socioeconomic History  . Marital status: Divorced    Spouse name: None  . Number of children: None  . Years of education: None  . Highest education level: None  Social Needs  . Financial resource strain: None  . Food insecurity - worry: None  . Food insecurity - inability: None  . Transportation needs - medical: None  . Transportation needs - non-medical: None  Occupational History  . Occupation: Retired  Tobacco Use  . Smoking status: Never Smoker  . Smokeless tobacco: Never Used  Substance and Sexual Activity  . Alcohol use: No  . Drug use: No  . Sexual activity: None  Other Topics Concern  . None  Social History Narrative  . None    family history includes Cancer in his mother; Stroke in his father.  Wt Readings from Last 3 Encounters:  07/26/17 158 lb (71.7 kg)  10/30/12 156 lb (70.8 kg)    PHYSICAL EXAM BP 136/62   Pulse 69   Ht 5\' 10"  (1.778 m)   Wt 158 lb  (71.7 kg)   BMI 22.67 kg/m  Physical Exam  Constitutional: He appears well-developed and well-nourished. No distress.  Appears healthy for stated age.  Well-groomed  HENT:  Head: Normocephalic.  Mouth/Throat: No oropharyngeal exudate.  Eyes: Conjunctivae and EOM are normal. No scleral icterus.  Neck: Normal range of motion. Neck supple. No hepatojugular reflux and no JVD present. Carotid bruit is not present. No tracheal deviation present.  Cardiovascular: Normal rate, regular rhythm and normal heart sounds.  No extrasystoles are present. PMI is not displaced. Exam reveals decreased pulses (Both posterior tibial and dorsalis pedis pulses somewhat diminished). Exam reveals no gallop.  No murmur heard. Abdominal: Soft. Bowel sounds  are normal. He exhibits no distension. There is no tenderness. There is no rebound and no guarding.  Lymphadenopathy:    He has no cervical adenopathy.  Skin: Skin is warm and dry. No rash noted. No erythema.  Psychiatric: He has a normal mood and affect. His behavior is normal. Judgment and thought content normal.  Nursing note and vitals reviewed.    Adult ECG Report  Rate: 69;  Rhythm: normal sinus rhythm and Normal axis, intervals and durations;   Narrative Interpretation: Normal EKG   Other studies Reviewed: Additional studies/ records that were reviewed today include:  Recent Labs: Labs available from October with CBC: W8.6H/H 12.5/34.6, platelet 256.   ASSESSMENT / PLAN: Exertional fatigue and some mom with coronary artery calcification: Check coronary CT angiogram-possible FFR.   History of occlusive left carotid artery disease, and moderate right carotid disease: We will follow carotid Dopplers. Leg weakness with exertion, cannot exclude PAD/claudication: Lower extremity ABI/arterial Dopplers.  Continue blood pressure control.  Also continue statin.  Need to adjust LDL goal to less than 70.  If I do not have labs from PCP for follow-up visit, I  will order repeat labs  Problem List Items Addressed This Visit    Carotid artery stenosis, symptomatic, left - s/p CEA (Chronic)    Due for follow-up carotid Dopplers.  He is on  Aspirin and Plavix along with high-dose statin. Also has noted pressure on ARB and HCTZ.      Relevant Orders   VAS US CAROTID   Coronary artery calcification seen on CAT scan (Chronic)    Coronary calcification on CT scan in a gentleman who is now presenting with exertional fatigue.  He has clear-cut evidence of atherosclerotic disease systemically. Plan: Coronary CT angiogram with possible CT FFR. Risk factor modification for atherosclerotic disease with statin.  I do not have labs available. -Should target LDL less than 70. Continue medical blood pressure control. Already on aspirin plus Plavix.      Relevant Orders   EKG 12-Lead (Completed)   Basic metabolic panel   CT CORONARY MORPH W/CTA COR W/SCORE W/CA W/CM &/OR WO/CM   CT CORONARY FRACTIONAL FLOW RESERVE DATA PREP   CT CORONARY FRACTIONAL FLOW RESERVE FLUID ANALYSIS   Essential hypertension (Chronic)    Well-controlled on current meds      Fatigue due to excessive exertion - Primary    Unusual type of symptoms to consider cardiac ischemia.  However he does have carotid disease as well as aortic and coronary artery calcification noted on CT scan. Plan: Coronary CT angiogram with possible CT FFR. He is not showing any signs of heart failure, therefore we will hold off on checking an echocardiogram.      Relevant Orders   EKG 12-Lead (Completed)   CT CORONARY MORPH W/CTA COR W/SCORE W/CA W/CM &/OR WO/CM   CT CORONARY FRACTIONAL FLOW RESERVE DATA PREP   CT CORONARY FRACTIONAL FLOW RESERVE FLUID ANALYSIS   VAS [place] ABI WITH/WO TBI   VAS [place] LOWER EXTREMITY ARTERIAL DUPLEX   History of left-sided carotid endarterectomy (Chronic)    Has not had a carotid Doppler since 2015 demonstrating close to 50% right carotid and no notable left carotid  disease. ->  Has not had follow-up since then.  Will order carotid Doppler      Relevant Orders   VAS US CAROTID   Hyperlipidemia with target LDL less than 70 (Chronic)    On moderate dose simvastatin.  Tolerating relatively well.  I  do not have recent labs.  But with his atherosclerotic disease, target LDL should be at least less than 70 if not closer to 50.      Relevant Orders   CT CORONARY MORPH W/CTA COR W/SCORE W/CA W/CM &/OR WO/CM   CT CORONARY FRACTIONAL FLOW RESERVE DATA PREP   CT CORONARY FRACTIONAL FLOW RESERVE FLUID ANALYSIS   VAS US CAROTID   VAS [place] ABI WITH/WO TBI   VAS [place] LOWER EXTREMITY ARTERIAL DUPLEX   Weakness of both lower extremities    This occurs with walking.  Hard to tell what it truly is.  Does not sound like claudication, however with carotid disease and other evidence of atherosclerotic disease, will check ABIs and lower from the arterial Dopplers to determine if this could be potentially peripheral vascular disease related.  He does have diminished pedal pulses but notes that his feet were cold because of the weather being cold outside.      Relevant Orders   VAS [place] ABI WITH/WO TBI   VAS [place] LOWER EXTREMITY ARTERIAL DUPLEX    Other Visit Diagnoses    Pre-op testing       Relevant Orders   Basic metabolic panel      Current medicines are reviewed at length with the patient today. (+/- concerns) n/a The following changes have been made: n/a  Patient Instructions  NO MEDICATION CHANGES   SCHEDULE AT [facility] has requested that you have a carotid duplex. This test is an ultrasound of the carotid arteries in your neck. It looks at blood flow through these arteries that supply the brain with blood. Allow one hour for this exam. There are no restrictions or special instructions. AND Your physician has requested that you have an ankle brachial index (ABI). During this test an ultrasound and blood pressure cuff are used  to evaluate the arteries that supply the arms and legs with blood. Allow thirty minutes for this exam. There are no restrictions or special instructions. AND Your physician has requested that you have a lower extremity arterial duplex. This test is an ultrasound of the arteries in the legs. It looks at arterial blood flow in the legs. Allow one hour for Lower  Arterial scans. There are no restrictions or special instructions   SCHEDULE AT HOSPITAL  Your physician has requested that you have cardiac CT. Cardiac computed tomography (CT) is a painless test that uses an x-ray machine to take clear, detailed pictures of your heart. For further information please visit [facility]. Please follow instruction sheet as given. PLEASE HAVE LAB WORK DONE AT LEAST ONE WEEK PRIOR TO THE TEST AND PICK UP LOPRESSOR FROM THE PHARMACY.     Your physician recommends that you schedule a follow-up appointment in 2 [place].   Studies Ordered:   Orders Placed This Encounter  Procedures  . CT CORONARY MORPH W/CTA COR W/SCORE W/CA W/CM &/OR WO/CM  . CT CORONARY FRACTIONAL FLOW RESERVE DATA PREP  . CT CORONARY FRACTIONAL FLOW RESERVE FLUID ANALYSIS  . Basic metabolic panel  . EKG 12-Lead      Glenetta Hew, M.D., M.S. Interventional Cardiologist   Pager # [phone] Phone # [phone] [address] [place], [place] [address]

## 2017-07-26 NOTE — Patient Instructions (Addendum)
NO MEDICATION CHANGES   SCHEDULE AT [facility] Your physician has requested that you have a carotid duplex. This test is an ultrasound of the carotid arteries in your neck. It looks at blood flow through these arteries that supply the brain with blood. Allow one hour for this exam. There are no restrictions or special instructions. AND Your physician has requested that you have an ankle brachial index (ABI). During this test an ultrasound and blood pressure cuff are used to evaluate the arteries that supply the arms and legs with blood. Allow thirty minutes for this exam. There are no restrictions or special instructions. AND Your physician has requested that you have a lower extremity arterial duplex. This test is an ultrasound of the arteries in the legs. It looks at arterial blood flow in the legs. Allow one hour for Lower  Arterial scans. There are no restrictions or special instructions   SCHEDULE AT HOSPITAL  Your physician has requested that you have cardiac CT. Cardiac computed tomography (CT) is a painless test that uses an x-ray machine to take clear, detailed pictures of your heart. For further information please visit [facility]. Please follow instruction sheet as given. PLEASE HAVE LAB WORK DONE AT LEAST ONE WEEK PRIOR TO THE TEST AND PICK UP LOPRESSOR FROM THE PHARMACY.      Your physician recommends that you schedule a follow-up appointment in 2 [place].        INSTRUCTION FOR CORONARY CTA   Please arrive at the [facility] main entrance of [facility] at (30-45 minutes prior to test start time)  [facility] [place], [place] [address] [phone]  Proceed to the [facility] Radiology Department (First Floor).  Please follow these instructions carefully (unless otherwise directed):  Hold all erectile dysfunction medications at least 48 hours prior to test.  On the Night Before the  Test: . Drink plenty of water. . Do not consume any caffeinated/decaffeinated beverages or chocolate 12 hours prior to your test. . Do not take any antihistamines 12 hours prior to your test.   On the Day of the Test: . Drink plenty of water. Do not drink any water within one hour of the test. . Do not eat any food 4 hours prior to the test. . You may take your regular medications prior to the test. . IF NOT ON A BETA BLOCKER - Take 50 mg of lopressor (metoprolol) one hour before the test.  After the Test: . Drink plenty of water. . After receiving IV contrast, you may experience a mild flushed feeling. This is normal. . On occasion, you may experience a mild rash up to 24 hours after the test. This is not dangerous. If this occurs, you can take Benadryl 25 mg and increase your fluid intake. . If you experience trouble breathing, this can be serious. If it is severe call 911 IMMEDIATELY. If it is mild, please call our office.

## 2017-07-28 ENCOUNTER — Encounter: Payer: Self-pay | Admitting: Cardiology

## 2017-07-28 NOTE — Assessment & Plan Note (Signed)
On moderate dose simvastatin.  Tolerating relatively well.  I do not have recent labs.  But with his atherosclerotic disease, target LDL should be at least less than 70 if not closer to 50.

## 2017-07-28 NOTE — Assessment & Plan Note (Signed)
Well-controlled on current meds 

## 2017-07-28 NOTE — Assessment & Plan Note (Signed)
Coronary calcification on CT scan in a gentleman who is now presenting with exertional fatigue.  He has clear-cut evidence of atherosclerotic disease systemically. Plan: Coronary CT angiogram with possible CT FFR. Risk factor modification for atherosclerotic disease with statin.  I do not have labs available. -Should target LDL less than 70. Continue medical blood pressure control. Already on aspirin plus Plavix.

## 2017-07-28 NOTE — Assessment & Plan Note (Signed)
This occurs with walking.  Hard to tell what it truly is.  Does not sound like claudication, however with carotid disease and other evidence of atherosclerotic disease, will check ABIs and lower from the arterial Dopplers to determine if this could be potentially peripheral vascular disease related.  He does have diminished pedal pulses but notes that his feet were cold because of the weather being cold outside.

## 2017-07-28 NOTE — Assessment & Plan Note (Signed)
Unusual type of symptoms to consider cardiac ischemia.  However he does have carotid disease as well as aortic and coronary artery calcification noted on CT scan. Plan: Coronary CT angiogram with possible CT FFR. He is not showing any signs of heart failure, therefore we will hold off on checking an echocardiogram.

## 2017-07-28 NOTE — Assessment & Plan Note (Signed)
Has not had a carotid Doppler since 2015 demonstrating close to 50% right carotid and no notable left carotid disease. ->  Has not had follow-up since then.  Will order carotid Doppler

## 2017-07-28 NOTE — Assessment & Plan Note (Signed)
Due for follow-up carotid Dopplers.  He is on  Aspirin and Plavix along with high-dose statin. Also has noted pressure on ARB and HCTZ.

## 2017-08-15 ENCOUNTER — Other Ambulatory Visit: Payer: Self-pay | Admitting: Cardiology

## 2017-08-15 DIAGNOSIS — I739 Peripheral vascular disease, unspecified: Secondary | ICD-10-CM

## 2017-08-25 ENCOUNTER — Ambulatory Visit (HOSPITAL_COMMUNITY)
Admission: RE | Admit: 2017-08-25 | Discharge: 2017-08-25 | Disposition: A | Discharge status: Home / Self Care | Payer: Medicare Other | Source: Ambulatory Visit | Attending: Cardiovascular Disease | Admitting: Cardiovascular Disease

## 2017-08-25 ENCOUNTER — Ambulatory Visit (HOSPITAL_BASED_OUTPATIENT_CLINIC_OR_DEPARTMENT_OTHER)
Admission: RE | Admit: 2017-08-25 | Discharge: 2017-08-25 | Disposition: A | Discharge status: Home / Self Care | Payer: Medicare Other | Source: Ambulatory Visit | Attending: Cardiology | Admitting: Cardiology

## 2017-08-25 DIAGNOSIS — R29898 Other symptoms and signs involving the musculoskeletal system: Secondary | ICD-10-CM | POA: Diagnosis not present

## 2017-08-25 DIAGNOSIS — I739 Peripheral vascular disease, unspecified: Secondary | ICD-10-CM

## 2017-08-25 DIAGNOSIS — E785 Hyperlipidemia, unspecified: Secondary | ICD-10-CM | POA: Diagnosis not present

## 2017-08-25 DIAGNOSIS — I6522 Occlusion and stenosis of left carotid artery: Secondary | ICD-10-CM

## 2017-08-25 DIAGNOSIS — Z9889 Other specified postprocedural states: Secondary | ICD-10-CM | POA: Diagnosis not present

## 2017-08-25 DIAGNOSIS — T733XXA Exhaustion due to excessive exertion, initial encounter: Secondary | ICD-10-CM | POA: Diagnosis not present

## 2017-08-25 DIAGNOSIS — X58XXXA Exposure to other specified factors, initial encounter: Secondary | ICD-10-CM | POA: Insufficient documentation

## 2017-09-09 ENCOUNTER — Telehealth: Payer: Self-pay | Admitting: *Deleted

## 2017-09-09 DIAGNOSIS — Z9889 Other specified postprocedural states: Secondary | ICD-10-CM

## 2017-09-09 DIAGNOSIS — I6522 Occlusion and stenosis of left carotid artery: Secondary | ICD-10-CM

## 2017-09-09 NOTE — Telephone Encounter (Signed)
Spoke to patient results of  Lower extrem. Dopplers, carotid. Patient verbalized understanding  Patient aware f/u of carotid in 2 years. Patient states he wants to cancel coronary CTA FOR 09/13/17. Patient states he is not having in chest discomfort or symptoms at present time  And he doe not see the need to have test done at present time also due the possible cost to him for having test completed.  Per patient cancelled test as well as follow up appointment 09/2016. - done  Reschedule a 6 month follow up patient is in agreement

## 2017-09-09 NOTE — Telephone Encounter (Signed)
-----   Message from Leonie Man, MD sent at 08/28/2017 10:42 PM EST ----- Carotid Duplex Study:  Bilateral Vertebral & Subclavian Arteries are normal.  Normal L Carotid with only minimal disease in R carotid. -- f/u 2 yrs.  Glenetta Hew, MD  Pls fwd to PCP>: Mateo Flow, MD

## 2017-09-09 NOTE — Telephone Encounter (Signed)
-----   Message from Leonie Man, MD sent at 08/28/2017 10:40 PM EST ----- Normal bilateral ABIs (roughly 1.0).  TBI's are somewhat reduced at 0.48 and 0.60. --Follow depending on symptoms.   Glenetta Hew, MD   pls fwd to PCP: Mateo Flow, MD

## 2017-09-13 ENCOUNTER — Ambulatory Visit (HOSPITAL_COMMUNITY): Payer: Medicare Other

## 2017-09-19 DIAGNOSIS — E119 Type 2 diabetes mellitus without complications: Secondary | ICD-10-CM | POA: Diagnosis not present

## 2017-09-19 DIAGNOSIS — M791 Myalgia, unspecified site: Secondary | ICD-10-CM | POA: Diagnosis not present

## 2017-09-19 DIAGNOSIS — R531 Weakness: Secondary | ICD-10-CM | POA: Diagnosis not present

## 2017-09-19 DIAGNOSIS — Z6823 Body mass index (BMI) 23.0-23.9, adult: Secondary | ICD-10-CM | POA: Diagnosis not present

## 2017-09-29 ENCOUNTER — Ambulatory Visit: Payer: Medicare Other | Admitting: Cardiology

## 2017-10-17 DIAGNOSIS — I10 Essential (primary) hypertension: Secondary | ICD-10-CM | POA: Diagnosis not present

## 2017-10-17 DIAGNOSIS — E119 Type 2 diabetes mellitus without complications: Secondary | ICD-10-CM | POA: Diagnosis not present

## 2017-10-17 DIAGNOSIS — E538 Deficiency of other specified B group vitamins: Secondary | ICD-10-CM | POA: Diagnosis not present

## 2017-10-17 DIAGNOSIS — Z6822 Body mass index (BMI) 22.0-22.9, adult: Secondary | ICD-10-CM | POA: Diagnosis not present

## 2017-10-31 ENCOUNTER — Encounter: Payer: Self-pay | Admitting: Gastroenterology

## 2017-11-16 DIAGNOSIS — D1801 Hemangioma of skin and subcutaneous tissue: Secondary | ICD-10-CM | POA: Diagnosis not present

## 2017-11-16 DIAGNOSIS — L570 Actinic keratosis: Secondary | ICD-10-CM | POA: Diagnosis not present

## 2017-11-16 DIAGNOSIS — L578 Other skin changes due to chronic exposure to nonionizing radiation: Secondary | ICD-10-CM | POA: Diagnosis not present

## 2017-11-16 DIAGNOSIS — C4441 Basal cell carcinoma of skin of scalp and neck: Secondary | ICD-10-CM | POA: Diagnosis not present

## 2017-11-16 DIAGNOSIS — L821 Other seborrheic keratosis: Secondary | ICD-10-CM | POA: Diagnosis not present

## 2017-11-25 DIAGNOSIS — I10 Essential (primary) hypertension: Secondary | ICD-10-CM | POA: Diagnosis not present

## 2017-11-25 DIAGNOSIS — E871 Hypo-osmolality and hyponatremia: Secondary | ICD-10-CM | POA: Diagnosis not present

## 2017-11-25 DIAGNOSIS — S20212A Contusion of left front wall of thorax, initial encounter: Secondary | ICD-10-CM | POA: Diagnosis not present

## 2017-11-25 DIAGNOSIS — R112 Nausea with vomiting, unspecified: Secondary | ICD-10-CM | POA: Diagnosis not present

## 2017-11-25 DIAGNOSIS — E538 Deficiency of other specified B group vitamins: Secondary | ICD-10-CM | POA: Diagnosis not present

## 2017-11-29 DIAGNOSIS — H16223 Keratoconjunctivitis sicca, not specified as Sjogren's, bilateral: Secondary | ICD-10-CM | POA: Insufficient documentation

## 2017-11-29 DIAGNOSIS — H0100B Unspecified blepharitis left eye, upper and lower eyelids: Secondary | ICD-10-CM | POA: Diagnosis not present

## 2017-11-29 DIAGNOSIS — H04123 Dry eye syndrome of bilateral lacrimal glands: Secondary | ICD-10-CM | POA: Diagnosis not present

## 2017-11-29 DIAGNOSIS — H0100A Unspecified blepharitis right eye, upper and lower eyelids: Secondary | ICD-10-CM | POA: Insufficient documentation

## 2017-11-29 HISTORY — DX: Keratoconjunctivitis sicca, not specified as Sjogren's, bilateral: H16.223

## 2017-11-29 HISTORY — DX: Unspecified blepharitis right eye, upper and lower eyelids: H01.00A

## 2017-12-08 ENCOUNTER — Encounter: Payer: Self-pay | Admitting: Gastroenterology

## 2017-12-08 ENCOUNTER — Ambulatory Visit (INDEPENDENT_AMBULATORY_CARE_PROVIDER_SITE_OTHER): Payer: Medicare Other | Admitting: Gastroenterology

## 2017-12-08 VITALS — BP 160/74 | HR 78 | Wt 155.4 lb

## 2017-12-08 DIAGNOSIS — K5909 Other constipation: Secondary | ICD-10-CM | POA: Diagnosis not present

## 2017-12-08 MED ORDER — LINACLOTIDE 72 MCG PO CAPS: 72.0000 ug | ORAL_CAPSULE | Freq: Every day | ORAL | 0 refills | Status: DC

## 2017-12-08 NOTE — Patient Instructions (Signed)
If you are age 82 or older, your body mass index should be between 23-30. Your Body mass index is 22.29 kg/m. If this is out of the aforementioned range listed, please consider follow up with your Primary Care Provider.  If you are age 27 or younger, your body mass index should be between 19-25. Your Body mass index is 22.29 kg/m. If this is out of the aformentioned range listed, please consider follow up with your Primary Care Provider.   Please purchase the following medications over the counter and take as directed: Benefiber 1 tablespoon daily in coffee.  We have given you samples of the following medication to take: Linzess 72 mcg take one capsule on Monday, Wednesday and Friday.    Please call Dr. Steve Rattler nurse Leeanne Rio) in 2 weeks and let her know how you are.   Thank you,  Dr. Jackquline Denmark

## 2017-12-08 NOTE — Progress Notes (Signed)
Chief Complaint: Constipation  Referring Provider: Dr Bertram Millard       ASSESSMENT AND PLAN;  Assessment:  chronic constipation, negative colonoscopy 03/2014 except for moderate sigmoid diverticulosis.  Plan: Benefiber 1 TBS po qd. Stop alleve Linzess 72 mcg qd (samples MWF) If any further N/V, proceed with CAT followed by EGD. Increase water intake. Call in 2 weeks. Follow-up if still with problems.     HPI:   Joseph Mcmillan is a very pleasant white male with history of long-standing constipation which got worse lately over the last 3 months.  He has been having bowel movements at the frequency of once in 3 days which has been hard and associated with abdominal bloating.  He had an episode of nausea/vomiting which is better now.  He denies having any melena or hematochezia.  There is been no weight loss or loss of appetite.  He does admit that he has been using more Aleve.  He has not been drinking enough water.  His most recent colonoscopy was on 04/17/2014 when he had moderate sigmoid diverticulosis.  It was otherwise unremarkable.  We decided to repeat colonoscopy only if he starts having any new problems.  He denies having any diarrhea.  No upper GI symptoms currently including nausea/vomiting, heartburn, regurgitation.   Past Medical History:  Diagnosis Date  . Aortic calcification (HCC)    Seen on CT scan  . BPH (benign prostatic hypertrophy)   . CAD (coronary artery disease)    Coronary artery calcification noted on CT scan  . Cataract, right eye 1994   Extracted with lens implant  . History of TIAs 2011   Status post left carotid endarterectomy  . Hyperlipidemia   . Hypertension   . Kidney stones   . Left-sided carotid artery disease ([place]) 02/2010   Left carotid endarterectomy  . Pernicious anemia      Past Surgical History:  Procedure Laterality Date  . CAROTID ARTERY ANGIOPLASTY Left   . CAROTID ENDARTERECTOMY  03-06-2010   Left CEA  . CATARACT EXTRACTION W/  INTRAOCULAR LENS IMPLANT Right   . CHOLECYSTECTOMY    . MELANOMA EXCISION     Family History  Problem Relation Age of Onset  . Cancer Mother   . Stroke Father    Social History   Tobacco Use  . Smoking status: Never Smoker  . Smokeless tobacco: Never Used  Substance Use Topics  . Alcohol use: No  . Drug use: No   Current Outpatient Medications  Medication Sig Dispense Refill  . clopidogrel (PLAVIX) 75 MG tablet Take 75 mg by mouth daily.    . pravastatin (PRAVACHOL) 10 MG tablet Take 10 mg by mouth daily.    . simvastatin (ZOCOR) 40 MG tablet Take 40 mg by mouth every evening.    . vitamin B-12 (CYANOCOBALAMIN) 1000 MCG tablet Take 1,000 mcg by mouth daily.    . metoprolol tartrate (LOPRESSOR) 50 MG tablet Take 1 tablet (50 mg total) by mouth once for 1 dose. TAKE ONE HOUR PRIOR TO  SCHEDULE CARDAIC TEST 1 tablet 0  . sildenafil (VIAGRA) 100 MG tablet Take 100 mg by mouth as needed.     No current facility-administered medications for this visit.    Allergies  Allergen Reactions  . Codeine     Review of Systems:  Constitutional: Denies fever, chills, diaphoresis, appetite change and fatigue.  HEENT: Denies photophobia, eye pain, redness, hearing loss, ear pain, congestion, sore throat, rhinorrhea, sneezing, mouth sores, neck pain, neck  stiffness and tinnitus.   Respiratory: Denies SOB, DOE, cough, chest tightness,  and wheezing.   Cardiovascular: Denies chest pain, palpitations and leg swelling.  Genitourinary: Denies dysuria, urgency, frequency, hematuria, flank pain and difficulty urinating.  Musculoskeletal: Denies myalgias, back pain, joint swelling, arthralgias and gait problem.  Skin: No rash. .  Neurological: Denies dizziness, seizures, syncope, weakness, light-headedness, numbness and headaches.  Hematological: Denies adenopathy. Easy bruising, personal or family bleeding history  Psychiatric/Behavioral: No anxiety or depression     Physical Exam:    BP (!)  160/74   Pulse 78   Wt 155 lb 6 oz (70.5 kg)   BMI 22.29 kg/m  Constitutional:  Well-developed, in no acute distress. Psychiatric: Normal mood and affect. Behavior is normal. HEENT: Pupils normal.  Conjunctivae are normal. No scleral icterus. Neck supple.  Cardiovascular: Normal rate, regular rhythm. No edema Pulmonary/chest: Effort normal and breath sounds normal. No wheezing, rales or rhonchi. Abdominal: Soft, nondistended. Nontender. Bowel sounds active throughout. There are no masses palpable. No hepatomegaly. Rectal:    Neurological: Alert and oriented to person place and time. Skin: Skin is warm and dry. No rashes noted. CMP Latest Ref Rng & Units 03/07/2010 03/05/2010  Glucose 70 - 99 mg/dL 142(H) 90  BUN 6 - 23 mg/dL 10 15  Creatinine 0.4 - 1.5 mg/dL 0.98 0.91  Sodium 135 - 145 mEq/L 136 137  Potassium 3.5 - 5.1 mEq/L 3.9 3.8  Chloride 96 - 112 mEq/L 101 98  CO2 19 - 32 mEq/L 29 30  Calcium 8.4 - 10.5 mg/dL 8.3(L) 9.1  Total Protein 6.0 - 8.3 g/dL - 7.3  Total Bilirubin 0.3 - 1.2 mg/dL - 0.7  Alkaline Phos 39 - 117 U/L - 77  AST 0 - 37 U/L - 26  ALT 0 - 53 U/L - 20     Jackquline Denmark, MD   Mateo Flow, MD

## 2017-12-23 ENCOUNTER — Telehealth: Payer: Self-pay | Admitting: Gastroenterology

## 2017-12-23 NOTE — Telephone Encounter (Signed)
[email or web address]

## 2017-12-23 NOTE — Telephone Encounter (Signed)
Patient calling to check in per ov note on 4.18.19. Patient states he is doing better but still does not feel that his bowels are moving the way they should. Pt also states medication linzess is seeming to help. Pt wants to know what next steps are.

## 2017-12-26 ENCOUNTER — Telehealth: Payer: Self-pay

## 2017-12-26 NOTE — Telephone Encounter (Signed)
[email or web address]

## 2017-12-27 ENCOUNTER — Telehealth: Payer: Self-pay

## 2017-12-27 NOTE — Telephone Encounter (Signed)
I spoke with him today and he still reports that he does not feel that he is having complete BM's.  He does not want to use the Benefiber as it has too much sugar.  He is going to try Miralax and see if this gives him a more complete BM.  He is on his third box of Linzess samples and is wondering about a prescription.  Please advise.  Thank you.

## 2017-12-27 NOTE — Telephone Encounter (Signed)
[email or web address]

## 2017-12-27 NOTE — Telephone Encounter (Signed)
Patient states he is returning phone call to [place] call back # [phone].

## 2017-12-28 NOTE — Telephone Encounter (Signed)
Sure let us try that Also please call in Linzess 72 mcg p.o. once a day, 30 with 6 refills Given cards and coupons

## 2017-12-29 ENCOUNTER — Telehealth: Payer: Self-pay

## 2017-12-29 ENCOUNTER — Other Ambulatory Visit: Payer: Self-pay

## 2017-12-29 DIAGNOSIS — K5909 Other constipation: Secondary | ICD-10-CM

## 2017-12-29 MED ORDER — LINACLOTIDE 72 MCG PO CAPS: 72.0000 ug | ORAL_CAPSULE | Freq: Every day | ORAL | 6 refills | Status: DC

## 2017-12-29 NOTE — Telephone Encounter (Signed)
Patient notified by phone, prescription sent to his pharmacy.

## 2018-01-31 DIAGNOSIS — L700 Acne vulgaris: Secondary | ICD-10-CM | POA: Diagnosis not present

## 2018-01-31 DIAGNOSIS — L570 Actinic keratosis: Secondary | ICD-10-CM | POA: Diagnosis not present

## 2018-01-31 DIAGNOSIS — L578 Other skin changes due to chronic exposure to nonionizing radiation: Secondary | ICD-10-CM | POA: Diagnosis not present

## 2018-01-31 DIAGNOSIS — L821 Other seborrheic keratosis: Secondary | ICD-10-CM | POA: Diagnosis not present

## 2018-01-31 DIAGNOSIS — L820 Inflamed seborrheic keratosis: Secondary | ICD-10-CM | POA: Diagnosis not present

## 2018-02-17 ENCOUNTER — Telehealth: Payer: Self-pay | Admitting: Gastroenterology

## 2018-02-17 DIAGNOSIS — W57XXXS Bitten or stung by nonvenomous insect and other nonvenomous arthropods, sequela: Secondary | ICD-10-CM | POA: Diagnosis not present

## 2018-02-17 DIAGNOSIS — Z6822 Body mass index (BMI) 22.0-22.9, adult: Secondary | ICD-10-CM | POA: Diagnosis not present

## 2018-02-17 DIAGNOSIS — W57XXXA Bitten or stung by nonvenomous insect and other nonvenomous arthropods, initial encounter: Secondary | ICD-10-CM | POA: Diagnosis not present

## 2018-02-17 DIAGNOSIS — L989 Disorder of the skin and subcutaneous tissue, unspecified: Secondary | ICD-10-CM | POA: Diagnosis not present

## 2018-02-20 NOTE — Telephone Encounter (Signed)
Left message for patient to call back  

## 2018-03-02 DIAGNOSIS — H16223 Keratoconjunctivitis sicca, not specified as Sjogren's, bilateral: Secondary | ICD-10-CM | POA: Diagnosis not present

## 2018-03-02 DIAGNOSIS — H0100B Unspecified blepharitis left eye, upper and lower eyelids: Secondary | ICD-10-CM | POA: Diagnosis not present

## 2018-03-02 DIAGNOSIS — H0100A Unspecified blepharitis right eye, upper and lower eyelids: Secondary | ICD-10-CM | POA: Diagnosis not present

## 2018-03-02 DIAGNOSIS — H268 Other specified cataract: Secondary | ICD-10-CM | POA: Diagnosis not present

## 2018-03-02 DIAGNOSIS — H26491 Other secondary cataract, right eye: Secondary | ICD-10-CM | POA: Diagnosis not present

## 2018-03-15 DIAGNOSIS — Z7689 Persons encountering health services in other specified circumstances: Secondary | ICD-10-CM | POA: Diagnosis not present

## 2018-03-15 DIAGNOSIS — W57XXXA Bitten or stung by nonvenomous insect and other nonvenomous arthropods, initial encounter: Secondary | ICD-10-CM | POA: Diagnosis not present

## 2018-03-15 DIAGNOSIS — Z0000 Encounter for general adult medical examination without abnormal findings: Secondary | ICD-10-CM | POA: Diagnosis not present

## 2018-03-15 DIAGNOSIS — R21 Rash and other nonspecific skin eruption: Secondary | ICD-10-CM | POA: Diagnosis not present

## 2018-03-15 DIAGNOSIS — Z1331 Encounter for screening for depression: Secondary | ICD-10-CM | POA: Diagnosis not present

## 2018-03-15 DIAGNOSIS — Z9181 History of falling: Secondary | ICD-10-CM | POA: Diagnosis not present

## 2018-03-27 DIAGNOSIS — I209 Angina pectoris, unspecified: Secondary | ICD-10-CM | POA: Diagnosis not present

## 2018-03-27 DIAGNOSIS — Z6822 Body mass index (BMI) 22.0-22.9, adult: Secondary | ICD-10-CM | POA: Diagnosis not present

## 2018-03-28 ENCOUNTER — Ambulatory Visit: Payer: Medicare Other | Admitting: Cardiology

## 2018-03-28 ENCOUNTER — Encounter: Payer: Self-pay | Admitting: Cardiology

## 2018-03-28 VITALS — BP 142/76 | HR 82 | Ht 70.0 in | Wt 150.6 lb

## 2018-03-28 DIAGNOSIS — I6522 Occlusion and stenosis of left carotid artery: Secondary | ICD-10-CM | POA: Diagnosis not present

## 2018-03-28 DIAGNOSIS — I2510 Atherosclerotic heart disease of native coronary artery without angina pectoris: Secondary | ICD-10-CM | POA: Diagnosis not present

## 2018-03-28 DIAGNOSIS — I209 Angina pectoris, unspecified: Secondary | ICD-10-CM | POA: Diagnosis not present

## 2018-03-28 DIAGNOSIS — I25119 Atherosclerotic heart disease of native coronary artery with unspecified angina pectoris: Secondary | ICD-10-CM

## 2018-03-28 DIAGNOSIS — Z01810 Encounter for preprocedural cardiovascular examination: Secondary | ICD-10-CM

## 2018-03-28 DIAGNOSIS — R918 Other nonspecific abnormal finding of lung field: Secondary | ICD-10-CM | POA: Diagnosis not present

## 2018-03-28 DIAGNOSIS — Z9889 Other specified postprocedural states: Secondary | ICD-10-CM

## 2018-03-28 DIAGNOSIS — E785 Hyperlipidemia, unspecified: Secondary | ICD-10-CM

## 2018-03-28 NOTE — Progress Notes (Signed)
Cardiology Office Note:    Date:  03/28/2018   ID:  Joseph Mcmillan, DOB 07-16-1934, MRN [id number]  PCP:  Mateo Flow, MD  Cardiologist:  Jenean Lindau, MD   Referring MD: Mateo Flow, MD    ASSESSMENT:    1. Angina pectoris ([place])   2. Coronary artery calcification seen on CAT scan   3. Carotid artery stenosis, symptomatic, left - s/p CEA   4. History of left-sided carotid endarterectomy   5. Hyperlipidemia with target LDL less than 70    PLAN:    In order of problems listed above:  1. Secondary prevention stressed with the patient.  Importance of compliance with diet and medication stressed and he vocalized understanding.  His blood pressure is stable.  Diet was explained for dyslipidemia. 2. His symptoms are very concerning and typical for angina pectoris.  In view of the patient's symptoms, I discussed with the patient options for evaluation. Invasive and noninvasive options were given to the patient. I discussed stress testing and coronary angiography and left heart catheterization at length. Benefits, pros and cons of each approach were discussed at length. Patient had multiple questions which were answered to the patient's satisfaction. Patient opted for invasive evaluation and we will set up for coronary angiography and left heart catheterization. Further recommendations will be made based on the findings with coronary angiography. In the interim if the patient has any significant symptoms in hospital to the nearest emergency room. 3. Sublingual nitroglycerin prescription was sent, its protocol and 911 protocol explained and the patient vocalized understanding questions were answered to the patient's satisfaction   Medication Adjustments/Labs and Tests Ordered: Current medicines are reviewed at length with the patient today.  Concerns regarding medicines are outlined above.  No orders of the defined types were placed in this encounter.  No orders of the defined types were  placed in this encounter.    History of Present Illness:    Joseph Mcmillan is a 84 y.o. male who is being seen today for the evaluation of angina pectoris at the request of Mateo Flow, MD.  Patient is a pleasant 84 year old male.  He has past medical history of atherosclerotic vascular disease and post carotid endarterectomy in the remote past.  Patient mentions to me that he has been experiencing chest tightness when he tries to exert himself and it goes to the left arm.  No orthopnea or PND.  This has been affecting his quality of life and effort tolerance.  He does use nitroglycerin once with complete relief of the symptoms.  At the time of my evaluation, the patient is alert awake oriented and in no distress.  He does give a history of dyslipidemia.  Past Medical History:  Diagnosis Date  . Aortic calcification (HCC)    Seen on CT scan  . BPH (benign prostatic hypertrophy)   . CAD (coronary artery disease)    Coronary artery calcification noted on CT scan  . Cataract, right eye 1994   Extracted with lens implant  . History of TIAs 2011   Status post left carotid endarterectomy  . Hyperlipidemia   . Hypertension   . Kidney stones   . Left-sided carotid artery disease ([place]) 02/2010   Left carotid endarterectomy  . Pernicious anemia     Past Surgical History:  Procedure Laterality Date  . CAROTID ARTERY ANGIOPLASTY Left   . CAROTID ENDARTERECTOMY  03-06-2010   Left CEA  . CATARACT EXTRACTION W/ INTRAOCULAR LENS  IMPLANT Right   . CHOLECYSTECTOMY    . MELANOMA EXCISION      Current Medications: Current Meds  Medication Sig  . clopidogrel (PLAVIX) 75 MG tablet Take 75 mg by mouth daily.  Marland Kitchen linaclotide (LINZESS) 72 MCG capsule Take 1 capsule (72 mcg total) by mouth daily before breakfast.  . pravastatin (PRAVACHOL) 10 MG tablet Take 10 mg by mouth daily.  . simvastatin (ZOCOR) 40 MG tablet Take 40 mg by mouth every evening.  . vitamin B-12 (CYANOCOBALAMIN) 1000 MCG tablet  Take 1,000 mcg by mouth daily.     Allergies:   Codeine   Social History   Socioeconomic History  . Marital status: Divorced    Spouse name: Not on file  . Number of children: Not on file  . Years of education: Not on file  . Highest education level: Not on file  Occupational History  . Occupation: Retired  Scientific laboratory technician  . Financial resource strain: Not on file  . Food insecurity:    Worry: Not on file    Inability: Not on file  . Transportation needs:    Medical: Not on file    Non-medical: Not on file  Tobacco Use  . Smoking status: Never Smoker  . Smokeless tobacco: Never Used  Substance and Sexual Activity  . Alcohol use: No  . Drug use: No  . Sexual activity: Not on file  Lifestyle  . Physical activity:    Days per week: Not on file    Minutes per session: Not on file  . Stress: Not on file  Relationships  . Social connections:    Talks on phone: Not on file    Gets together: Not on file    Attends religious service: Not on file    Active member of club or organization: Not on file    Attends meetings of clubs or organizations: Not on file    Relationship status: Not on file  Other Topics Concern  . Not on file  Social History Narrative  . Not on file     Family History: The patient's family history includes Cancer in his mother; Stroke in his father.  ROS:   Please see the history of present illness.    All other systems reviewed and are negative.  EKGs/Labs/Other Studies Reviewed:    The following studies were reviewed today: I reviewed records from primary care physician's office.  I do not have a recent renal function assessment.   Recent Labs: No results found for requested labs within last 8760 hours.  Recent Lipid Panel No results found for: CHOL, TRIG, HDL, CHOLHDL, VLDL, LDLCALC, LDLDIRECT  Physical Exam:    VS:  BP (!) 142/76 (BP Location: Right Arm, Patient Position: Sitting, Cuff Size: Normal)   Pulse 82   Ht 5\' 10"  (1.778 m)    Wt 150 lb 9.6 oz (68.3 kg)   SpO2 96%   BMI 21.61 kg/m     Wt Readings from Last 3 Encounters:  03/28/18 150 lb 9.6 oz (68.3 kg)  12/08/17 155 lb 6 oz (70.5 kg)  07/26/17 158 lb (71.7 kg)     GEN: Patient is in no acute distress HEENT: Normal NECK: No JVD; No carotid bruits LYMPHATICS: No lymphadenopathy CARDIAC: S1 S2 regular, 2/6 systolic murmur at the apex. RESPIRATORY:  Clear to auscultation without rales, wheezing or rhonchi  ABDOMEN: Soft, non-tender, non-distended MUSCULOSKELETAL:  No edema; No deformity  SKIN: Warm and dry NEUROLOGIC:  Alert and oriented x  3 PSYCHIATRIC:  Normal affect    Signed, Jenean Lindau, MD  03/28/2018 11:55 AM    [facility]

## 2018-03-28 NOTE — Patient Instructions (Addendum)
Medication Instructions:  Your physician recommends that you continue on your current medications as directed. Please refer to the Current Medication list given to you today.   Labwork: You will have labs today  Testing/Procedures: A chest x-ray takes a picture of the organs and structures inside the chest, including the heart, lungs, and blood vessels. This test can show several things, including, whether the heart is enlarges; whether fluid is building up in the lungs; and whether pacemaker / defibrillator leads are still in place.  Your physician has requested that you have a cardiac catheterization. Cardiac catheterization is used to diagnose and/or treat various heart conditions. Doctors may recommend this procedure for a number of different reasons. The most common reason is to evaluate chest pain. Chest pain can be a symptom of coronary artery disease (CAD), and cardiac catheterization can show whether plaque is narrowing or blocking your heart's arteries. This procedure is also used to evaluate the valves, as well as measure the blood flow and oxygen levels in different parts of your heart. For further information please visit [facility]tn. Please follow instruction sheet, as given.     [address] [facility] [place] [address] Dept: [phone] Loc: [phone]  JOSHIA KITCHINGS  03/28/2018  You are scheduled for a Cardiac Catheterization on Thursday, August 8 with Dr. Larae Grooms.  1. Please arrive at the [facility] (Main Entrance A) at [facility]: [address] [place], [place] [address] at 7:30 AM (This time is two hours before your procedure to ensure your preparation). Free valet parking service is available.   Special note: Every effort is made to have your procedure done on time. Please understand that emergencies sometimes delay scheduled procedures.  2. Diet: Do not  eat solid foods after midnight.  The patient may have clear liquids until 5am upon the day of the procedure.   3. Labs: You will need to have blood drawn today.  4. Medication instructions in preparation for your procedure:  On the morning of your procedure, take your Plavix and  Aspirin and any morning medicines NOT listed above.  You may use sips of water.  5. Plan for one night stay--bring personal belongings. 6. Bring a current list of your medications and current insurance cards. 7. You MUST have a responsible person to drive you home. 8. Someone MUST be with you the first 24 hours after you arrive home or your discharge will be delayed. 9. Please wear clothes that are easy to get on and off and wear slip-on shoes.  Thank you for allowing [place] to care for you!   -- [facility]   Follow-Up: Your physician wants you to follow-up in:2 months.  You will receive a reminder letter in the mail two months in advance. If you don't receive a letter, please call our office to schedule the follow-up appointment.   Any Other Special Instructions Will Be Listed Below (If Applicable).     If you need a refill on your cardiac medications before your next appointment, please call your pharmacy.

## 2018-03-28 NOTE — H&P (View-Only) (Signed)
Cardiology Office Note:    Date:  03/28/2018   ID:  Joseph Mcmillan, DOB September 16, 1933, MRN [id number]  PCP:  Mateo Flow, MD  Cardiologist:  Jenean Lindau, MD   Referring MD: Mateo Flow, MD    ASSESSMENT:    1. Angina pectoris ([place])   2. Coronary artery calcification seen on CAT scan   3. Carotid artery stenosis, symptomatic, left - s/p CEA   4. History of left-sided carotid endarterectomy   5. Hyperlipidemia with target LDL less than 70    PLAN:    In order of problems listed above:  1. Secondary prevention stressed with the patient.  Importance of compliance with diet and medication stressed and he vocalized understanding.  His blood pressure is stable.  Diet was explained for dyslipidemia. 2. His symptoms are very concerning and typical for angina pectoris.  In view of the patient's symptoms, I discussed with the patient options for evaluation. Invasive and noninvasive options were given to the patient. I discussed stress testing and coronary angiography and left heart catheterization at length. Benefits, pros and cons of each approach were discussed at length. Patient had multiple questions which were answered to the patient's satisfaction. Patient opted for invasive evaluation and we will set up for coronary angiography and left heart catheterization. Further recommendations will be made based on the findings with coronary angiography. In the interim if the patient has any significant symptoms in hospital to the nearest emergency room. 3. Sublingual nitroglycerin prescription was sent, its protocol and 911 protocol explained and the patient vocalized understanding questions were answered to the patient's satisfaction   Medication Adjustments/Labs and Tests Ordered: Current medicines are reviewed at length with the patient today.  Concerns regarding medicines are outlined above.  No orders of the defined types were placed in this encounter.  No orders of the defined types were  placed in this encounter.    History of Present Illness:    Joseph Mcmillan is a 84 y.o. male who is being seen today for the evaluation of angina pectoris at the request of Mateo Flow, MD.  Patient is a pleasant 84 year old male.  He has past medical history of atherosclerotic vascular disease and post carotid endarterectomy in the remote past.  Patient mentions to me that he has been experiencing chest tightness when he tries to exert himself and it goes to the left arm.  No orthopnea or PND.  This has been affecting his quality of life and effort tolerance.  He does use nitroglycerin once with complete relief of the symptoms.  At the time of my evaluation, the patient is alert awake oriented and in no distress.  He does give a history of dyslipidemia.  Past Medical History:  Diagnosis Date  . Aortic calcification (HCC)    Seen on CT scan  . BPH (benign prostatic hypertrophy)   . CAD (coronary artery disease)    Coronary artery calcification noted on CT scan  . Cataract, right eye 1994   Extracted with lens implant  . History of TIAs 2011   Status post left carotid endarterectomy  . Hyperlipidemia   . Hypertension   . Kidney stones   . Left-sided carotid artery disease ([place]) 02/2010   Left carotid endarterectomy  . Pernicious anemia     Past Surgical History:  Procedure Laterality Date  . CAROTID ARTERY ANGIOPLASTY Left   . CAROTID ENDARTERECTOMY  03-06-2010   Left CEA  . CATARACT EXTRACTION W/ INTRAOCULAR LENS  IMPLANT Right   . CHOLECYSTECTOMY    . MELANOMA EXCISION      Current Medications: Current Meds  Medication Sig  . clopidogrel (PLAVIX) 75 MG tablet Take 75 mg by mouth daily.  Marland Kitchen linaclotide (LINZESS) 72 MCG capsule Take 1 capsule (72 mcg total) by mouth daily before breakfast.  . pravastatin (PRAVACHOL) 10 MG tablet Take 10 mg by mouth daily.  . simvastatin (ZOCOR) 40 MG tablet Take 40 mg by mouth every evening.  . vitamin B-12 (CYANOCOBALAMIN) 1000 MCG tablet  Take 1,000 mcg by mouth daily.     Allergies:   Codeine   Social History   Socioeconomic History  . Marital status: Divorced    Spouse name: Not on file  . Number of children: Not on file  . Years of education: Not on file  . Highest education level: Not on file  Occupational History  . Occupation: Retired  Scientific laboratory technician  . Financial resource strain: Not on file  . Food insecurity:    Worry: Not on file    Inability: Not on file  . Transportation needs:    Medical: Not on file    Non-medical: Not on file  Tobacco Use  . Smoking status: Never Smoker  . Smokeless tobacco: Never Used  Substance and Sexual Activity  . Alcohol use: No  . Drug use: No  . Sexual activity: Not on file  Lifestyle  . Physical activity:    Days per week: Not on file    Minutes per session: Not on file  . Stress: Not on file  Relationships  . Social connections:    Talks on phone: Not on file    Gets together: Not on file    Attends religious service: Not on file    Active member of club or organization: Not on file    Attends meetings of clubs or organizations: Not on file    Relationship status: Not on file  Other Topics Concern  . Not on file  Social History Narrative  . Not on file     Family History: The patient's family history includes Cancer in his mother; Stroke in his father.  ROS:   Please see the history of present illness.    All other systems reviewed and are negative.  EKGs/Labs/Other Studies Reviewed:    The following studies were reviewed today: I reviewed records from primary care physician's office.  I do not have a recent renal function assessment.   Recent Labs: No results found for requested labs within last 8760 hours.  Recent Lipid Panel No results found for: CHOL, TRIG, HDL, CHOLHDL, VLDL, LDLCALC, LDLDIRECT  Physical Exam:    VS:  BP (!) 142/76 (BP Location: Right Arm, Patient Position: Sitting, Cuff Size: Normal)   Pulse 82   Ht 5\' 10"  (1.778 m)    Wt 150 lb 9.6 oz (68.3 kg)   SpO2 96%   BMI 21.61 kg/m     Wt Readings from Last 3 Encounters:  03/28/18 150 lb 9.6 oz (68.3 kg)  12/08/17 155 lb 6 oz (70.5 kg)  07/26/17 158 lb (71.7 kg)     GEN: Patient is in no acute distress HEENT: Normal NECK: No JVD; No carotid bruits LYMPHATICS: No lymphadenopathy CARDIAC: S1 S2 regular, 2/6 systolic murmur at the apex. RESPIRATORY:  Clear to auscultation without rales, wheezing or rhonchi  ABDOMEN: Soft, non-tender, non-distended MUSCULOSKELETAL:  No edema; No deformity  SKIN: Warm and dry NEUROLOGIC:  Alert and oriented x  3 PSYCHIATRIC:  Normal affect    Signed, Jenean Lindau, MD  03/28/2018 11:55 AM    [facility]

## 2018-03-29 ENCOUNTER — Telehealth: Payer: Self-pay

## 2018-03-29 ENCOUNTER — Telehealth: Payer: Self-pay | Admitting: *Deleted

## 2018-03-29 LAB — CBC
HEMATOCRIT: 39.8 % (ref 37.5–51.0)
HEMOGLOBIN: 13.6 g/dL (ref 13.0–17.7)
MCH: 31.4 pg (ref 26.6–33.0)
MCHC: 34.2 g/dL (ref 31.5–35.7)
MCV: 92 fL (ref 79–97)
Platelets: 257 10*3/uL (ref 150–450)
RBC: 4.33 x10E6/uL (ref 4.14–5.80)
RDW: 13.9 % (ref 12.3–15.4)
WBC: 7.2 10*3/uL (ref 3.4–10.8)

## 2018-03-29 LAB — BASIC METABOLIC PANEL
BUN / CREAT RATIO: 14 (ref 10–24)
BUN: 14 mg/dL (ref 8–27)
CALCIUM: 9.4 mg/dL (ref 8.6–10.2)
CO2: 25 mmol/L (ref 20–29)
CREATININE: 1 mg/dL (ref 0.76–1.27)
Chloride: 98 mmol/L (ref 96–106)
GFR, EST AFRICAN AMERICAN: 81 mL/min/{1.73_m2} (ref >59)
GFR, EST NON AFRICAN AMERICAN: 70 mL/min/{1.73_m2} (ref >59)
Glucose: 158 mg/dL — ABNORMAL HIGH (ref 65–99)
Potassium: 4.4 mmol/L (ref 3.5–5.2)
Sodium: 139 mmol/L (ref 134–144)

## 2018-03-29 NOTE — Telephone Encounter (Signed)
Pt contacted pre-catheterization scheduled at [facility] for: Thursday March 30, 2018 9 AM (time change-pt request) Verified arrival time and place: [facility] Main Entrance A at: 7 AM  No solid food after midnight prior to cath, clear liquids until 5 AM day of procedure.  AM meds can be  taken pre-cath with sip of water including: ASA 81 mg Clopidogrel 75 mg  Confirmed patient has responsible person to drive home post procedure and for 24 hours after you arrive home.: yes

## 2018-03-29 NOTE — Telephone Encounter (Signed)
Patients wife Melissa notified of normal or stable CXR per Dr Geraldo Pitter. Patients wife verbalized understanding.

## 2018-03-30 ENCOUNTER — Other Ambulatory Visit: Payer: Self-pay

## 2018-03-30 ENCOUNTER — Encounter (HOSPITAL_COMMUNITY): Payer: Self-pay | Admitting: Interventional Cardiology

## 2018-03-30 ENCOUNTER — Encounter (HOSPITAL_COMMUNITY)
Admission: AD | Disposition: A | Discharge status: Home / Self Care | Payer: Self-pay | Source: Ambulatory Visit | Attending: Surgery

## 2018-03-30 ENCOUNTER — Other Ambulatory Visit: Payer: Self-pay | Admitting: *Deleted

## 2018-03-30 ENCOUNTER — Inpatient Hospital Stay (HOSPITAL_COMMUNITY)
Admission: AD | Admit: 2018-03-30 | Discharge: 2018-04-13 | DRG: 234 | Disposition: A | Discharge status: Home / Self Care | Payer: Medicare Other | Source: Ambulatory Visit | Attending: Surgery | Admitting: Surgery

## 2018-03-30 DIAGNOSIS — I6529 Occlusion and stenosis of unspecified carotid artery: Secondary | ICD-10-CM | POA: Diagnosis present

## 2018-03-30 DIAGNOSIS — Z6822 Body mass index (BMI) 22.0-22.9, adult: Secondary | ICD-10-CM | POA: Diagnosis not present

## 2018-03-30 DIAGNOSIS — I200 Unstable angina: Secondary | ICD-10-CM | POA: Diagnosis not present

## 2018-03-30 DIAGNOSIS — I25119 Atherosclerotic heart disease of native coronary artery with unspecified angina pectoris: Principal | ICD-10-CM | POA: Diagnosis present

## 2018-03-30 DIAGNOSIS — Z951 Presence of aortocoronary bypass graft: Secondary | ICD-10-CM | POA: Diagnosis not present

## 2018-03-30 DIAGNOSIS — I209 Angina pectoris, unspecified: Secondary | ICD-10-CM

## 2018-03-30 DIAGNOSIS — Z9889 Other specified postprocedural states: Secondary | ICD-10-CM

## 2018-03-30 DIAGNOSIS — Z8673 Personal history of transient ischemic attack (TIA), and cerebral infarction without residual deficits: Secondary | ICD-10-CM | POA: Diagnosis not present

## 2018-03-30 DIAGNOSIS — Z87442 Personal history of urinary calculi: Secondary | ICD-10-CM

## 2018-03-30 DIAGNOSIS — I25110 Atherosclerotic heart disease of native coronary artery with unstable angina pectoris: Secondary | ICD-10-CM

## 2018-03-30 DIAGNOSIS — Z7902 Long term (current) use of antithrombotics/antiplatelets: Secondary | ICD-10-CM

## 2018-03-30 DIAGNOSIS — I2510 Atherosclerotic heart disease of native coronary artery without angina pectoris: Secondary | ICD-10-CM

## 2018-03-30 DIAGNOSIS — N400 Enlarged prostate without lower urinary tract symptoms: Secondary | ICD-10-CM | POA: Diagnosis present

## 2018-03-30 DIAGNOSIS — I4891 Unspecified atrial fibrillation: Secondary | ICD-10-CM | POA: Diagnosis not present

## 2018-03-30 DIAGNOSIS — Z01810 Encounter for preprocedural cardiovascular examination: Secondary | ICD-10-CM | POA: Diagnosis not present

## 2018-03-30 DIAGNOSIS — Z961 Presence of intraocular lens: Secondary | ICD-10-CM | POA: Diagnosis not present

## 2018-03-30 DIAGNOSIS — E785 Hyperlipidemia, unspecified: Secondary | ICD-10-CM | POA: Diagnosis not present

## 2018-03-30 DIAGNOSIS — I739 Peripheral vascular disease, unspecified: Secondary | ICD-10-CM | POA: Diagnosis not present

## 2018-03-30 DIAGNOSIS — I088 Other rheumatic multiple valve diseases: Secondary | ICD-10-CM | POA: Diagnosis not present

## 2018-03-30 DIAGNOSIS — J90 Pleural effusion, not elsewhere classified: Secondary | ICD-10-CM | POA: Diagnosis not present

## 2018-03-30 DIAGNOSIS — I959 Hypotension, unspecified: Secondary | ICD-10-CM | POA: Diagnosis not present

## 2018-03-30 DIAGNOSIS — Z9841 Cataract extraction status, right eye: Secondary | ICD-10-CM | POA: Diagnosis not present

## 2018-03-30 DIAGNOSIS — I6522 Occlusion and stenosis of left carotid artery: Secondary | ICD-10-CM

## 2018-03-30 DIAGNOSIS — Z09 Encounter for follow-up examination after completed treatment for conditions other than malignant neoplasm: Secondary | ICD-10-CM

## 2018-03-30 DIAGNOSIS — K589 Irritable bowel syndrome without diarrhea: Secondary | ICD-10-CM | POA: Diagnosis present

## 2018-03-30 DIAGNOSIS — J9811 Atelectasis: Secondary | ICD-10-CM | POA: Diagnosis not present

## 2018-03-30 DIAGNOSIS — I10 Essential (primary) hypertension: Secondary | ICD-10-CM | POA: Diagnosis not present

## 2018-03-30 DIAGNOSIS — E440 Moderate protein-calorie malnutrition: Secondary | ICD-10-CM | POA: Diagnosis not present

## 2018-03-30 DIAGNOSIS — Z79899 Other long term (current) drug therapy: Secondary | ICD-10-CM

## 2018-03-30 DIAGNOSIS — Z885 Allergy status to narcotic agent status: Secondary | ICD-10-CM | POA: Diagnosis not present

## 2018-03-30 DIAGNOSIS — E1151 Type 2 diabetes mellitus with diabetic peripheral angiopathy without gangrene: Secondary | ICD-10-CM | POA: Diagnosis present

## 2018-03-30 DIAGNOSIS — I480 Paroxysmal atrial fibrillation: Secondary | ICD-10-CM | POA: Diagnosis not present

## 2018-03-30 DIAGNOSIS — D62 Acute posthemorrhagic anemia: Secondary | ICD-10-CM | POA: Diagnosis not present

## 2018-03-30 DIAGNOSIS — Z452 Encounter for adjustment and management of vascular access device: Secondary | ICD-10-CM | POA: Diagnosis not present

## 2018-03-30 HISTORY — PX: LEFT HEART CATH AND CORONARY ANGIOGRAPHY: CATH118249

## 2018-03-30 HISTORY — DX: Personal history of urinary calculi: Z87.442

## 2018-03-30 HISTORY — PX: CARDIAC CATHETERIZATION: SHX172

## 2018-03-30 LAB — GLUCOSE, CAPILLARY
Glucose-Capillary: 144 mg/dL — ABNORMAL HIGH (ref 70–99)
Glucose-Capillary: 140 mg/dL — ABNORMAL HIGH (ref 70–99)

## 2018-03-30 SURGERY — LEFT HEART CATH AND CORONARY ANGIOGRAPHY
Anesthesia: LOCAL

## 2018-03-30 MED ORDER — PROSTATE SUPPORT 300-15 MG PO TABS: 1.0000 | ORAL_TABLET | Freq: Every day | ORAL | Status: DC

## 2018-03-30 MED ORDER — SODIUM CHLORIDE 0.9% FLUSH
3.0000 mL | Freq: Two times a day (BID) | INTRAVENOUS | Status: DC
  Administered 2018-03-30 – 2018-04-03 (×9): 3 mL via INTRAVENOUS

## 2018-03-30 MED ORDER — ASPIRIN 81 MG PO CHEW: 81.0000 mg | CHEWABLE_TABLET | ORAL | Status: DC

## 2018-03-30 MED ORDER — IOPAMIDOL (ISOVUE-370) INJECTION 76%
INTRAVENOUS | Status: AC
  Filled 2018-03-30: qty 100

## 2018-03-30 MED ORDER — MIDAZOLAM HCL 2 MG/2ML IJ SOLN
INTRAMUSCULAR | Status: AC
  Filled 2018-03-30: qty 2

## 2018-03-30 MED ORDER — IOPAMIDOL (ISOVUE-370) INJECTION 76%
INTRAVENOUS | Status: DC | PRN
  Administered 2018-03-30: 70 mL via INTRA_ARTERIAL

## 2018-03-30 MED ORDER — NITROGLYCERIN 0.4 MG SL SUBL: 0.4000 mg | SUBLINGUAL_TABLET | SUBLINGUAL | Status: DC | PRN

## 2018-03-30 MED ORDER — SODIUM CHLORIDE 0.9 % IV SOLN: 250.0000 mL | INTRAVENOUS | Status: DC | PRN

## 2018-03-30 MED ORDER — ACETAMINOPHEN 325 MG PO TABS: 650.0000 mg | ORAL_TABLET | ORAL | Status: DC | PRN

## 2018-03-30 MED ORDER — SODIUM CHLORIDE 0.9% FLUSH: 3.0000 mL | INTRAVENOUS | Status: DC | PRN

## 2018-03-30 MED ORDER — RAMELTEON 8 MG PO TABS
8.0000 mg | ORAL_TABLET | Freq: Every evening | ORAL | Status: DC | PRN
  Administered 2018-03-30: 8 mg via ORAL
  Filled 2018-03-30 (×2): qty 1

## 2018-03-30 MED ORDER — LIDOCAINE HCL (PF) 1 % IJ SOLN
INTRAMUSCULAR | Status: DC | PRN
  Administered 2018-03-30: 2 mL

## 2018-03-30 MED ORDER — SODIUM CHLORIDE 0.9 % WEIGHT BASED INFUSION: 1.0000 mL/kg/h | INTRAVENOUS | Status: DC

## 2018-03-30 MED ORDER — ASPIRIN EC 81 MG PO TBEC
81.0000 mg | DELAYED_RELEASE_TABLET | Freq: Every day | ORAL | Status: DC
  Administered 2018-03-31 – 2018-04-03 (×4): 81 mg via ORAL
  Filled 2018-03-30 (×4): qty 1

## 2018-03-30 MED ORDER — HEPARIN (PORCINE) IN NACL 1000-0.9 UT/500ML-% IV SOLN
INTRAVENOUS | Status: AC
  Filled 2018-03-30: qty 500

## 2018-03-30 MED ORDER — HEPARIN (PORCINE) IN NACL 100-0.45 UNIT/ML-% IJ SOLN
850.0000 [IU]/h | INTRAMUSCULAR | Status: DC
  Administered 2018-03-30: 1100 [IU]/h via INTRAVENOUS
  Administered 2018-03-31: 1000 [IU]/h via INTRAVENOUS
  Filled 2018-03-30 (×4): qty 250

## 2018-03-30 MED ORDER — ENSURE ENLIVE PO LIQD
237.0000 mL | Freq: Two times a day (BID) | ORAL | Status: DC
  Administered 2018-03-30 – 2018-04-03 (×9): 237 mL via ORAL

## 2018-03-30 MED ORDER — HEPARIN SODIUM (PORCINE) 1000 UNIT/ML IJ SOLN
INTRAMUSCULAR | Status: DC | PRN
  Administered 2018-03-30: 3500 [IU] via INTRAVENOUS

## 2018-03-30 MED ORDER — SODIUM CHLORIDE 0.9 % WEIGHT BASED INFUSION
3.0000 mL/kg/h | INTRAVENOUS | Status: DC
  Administered 2018-03-30: 3 mL/kg/h via INTRAVENOUS

## 2018-03-30 MED ORDER — HEPARIN (PORCINE) IN NACL 1000-0.9 UT/500ML-% IV SOLN
INTRAVENOUS | Status: DC | PRN
  Administered 2018-03-30 (×2): 500 mL

## 2018-03-30 MED ORDER — SIMVASTATIN 40 MG PO TABS
40.0000 mg | ORAL_TABLET | Freq: Every day | ORAL | Status: DC
  Administered 2018-03-30 – 2018-04-09 (×11): 40 mg via ORAL
  Filled 2018-03-30 (×11): qty 1

## 2018-03-30 MED ORDER — ASPIRIN 81 MG PO CHEW: 81.0000 mg | CHEWABLE_TABLET | Freq: Every day | ORAL | Status: DC

## 2018-03-30 MED ORDER — SODIUM CHLORIDE 0.9 % IV SOLN: INTRAVENOUS | Status: AC

## 2018-03-30 MED ORDER — HEPARIN (PORCINE) IN NACL 2-0.9 UNITS/ML
INTRAMUSCULAR | Status: DC | PRN
  Administered 2018-03-30: 10 mL via INTRA_ARTERIAL

## 2018-03-30 MED ORDER — ONDANSETRON HCL 4 MG/2ML IJ SOLN: 4.0000 mg | Freq: Four times a day (QID) | INTRAMUSCULAR | Status: DC | PRN

## 2018-03-30 MED ORDER — SODIUM CHLORIDE 0.9% FLUSH: 3.0000 mL | Freq: Two times a day (BID) | INTRAVENOUS | Status: DC

## 2018-03-30 MED ORDER — LIDOCAINE HCL (PF) 1 % IJ SOLN
INTRAMUSCULAR | Status: AC
  Filled 2018-03-30: qty 30

## 2018-03-30 MED ORDER — LINACLOTIDE 72 MCG PO CAPS
72.0000 ug | ORAL_CAPSULE | Freq: Every day | ORAL | Status: DC
  Administered 2018-03-31 – 2018-04-13 (×11): 72 ug via ORAL
  Filled 2018-03-30 (×15): qty 1

## 2018-03-30 MED ORDER — FENTANYL CITRATE (PF) 100 MCG/2ML IJ SOLN
INTRAMUSCULAR | Status: AC
  Filled 2018-03-30: qty 2

## 2018-03-30 MED ORDER — MIDAZOLAM HCL 2 MG/2ML IJ SOLN
INTRAMUSCULAR | Status: DC | PRN
  Administered 2018-03-30 (×2): 1 mg via INTRAVENOUS

## 2018-03-30 MED ORDER — FENTANYL CITRATE (PF) 100 MCG/2ML IJ SOLN
INTRAMUSCULAR | Status: DC | PRN
  Administered 2018-03-30: 25 ug via INTRAVENOUS

## 2018-03-30 SURGICAL SUPPLY — 10 items
CATH 5FR JL3.5 JR4 ANG PIG MP (CATHETERS) ×1 IMPLANT
DEVICE RAD COMP TR BAND LRG (VASCULAR PRODUCTS) ×1 IMPLANT
GLIDESHEATH SLEND SS 6F .021 (SHEATH) ×1 IMPLANT
GUIDEWIRE INQWIRE 1.5J.035X260 (WIRE) IMPLANT
INQWIRE 1.5J .035X260CM (WIRE) ×2
KIT HEART LEFT (KITS) ×2 IMPLANT
PACK CARDIAC CATHETERIZATION (CUSTOM PROCEDURE TRAY) ×2 IMPLANT
TRANSDUCER W/STOPCOCK (MISCELLANEOUS) ×2 IMPLANT
TUBING CIL FLEX 10 FLL-RA (TUBING) ×2 IMPLANT
WIRE HI TORQ VERSACORE-J 145CM (WIRE) ×1 IMPLANT

## 2018-03-30 NOTE — Progress Notes (Signed)
ANTICOAGULATION CONSULT NOTE - Follow Up Consult  Pharmacy Consult for Heparin Indication: Severe 3 vessel CAD  Allergies  Allergen Reactions  . Codeine Nausea And Vomiting    Patient Measurements: Height: 5\' 9"  (175.3 cm) Weight: 151 lb (68.5 kg) IBW/kg (Calculated) : 70.7  Vital Signs: Temp: 98.1 F (36.7 C) (08/08 0716) Temp Source: Temporal (08/08 0716) BP: 165/71 (08/08 1130) Pulse Rate: 65 (08/08 1130)  Labs: Recent Labs    03/28/18 1222  HGB 13.6  HCT 39.8  PLT 257  CREATININE 1.00    Estimated Creatinine Clearance: 56.1 mL/min (by C-G formula based on SCr of 1 mg/dL).   Assessment: 84 year old male s/p cath found to have severe 3 vessel disease to begin heparin tonight at 6 pm  Goal of Therapy:  Heparin level 0.3-0.7 units/ml Monitor platelets by anticoagulation protocol: Yes   Plan:  Heparin at 1100 units / hr starting at 6 pm Daily heparin level, CBC  Thank you Anette Guarneri, PharmD [phone] 03/30/2018,12:45 PM

## 2018-03-30 NOTE — Consult Note (Addendum)
[address]       [place] [address]             [phone]        Caedon D Sauerwein [place] Medical Record #[id number] Date of Birth: Aug 25, 1933  Referring: No ref. provider found Primary Care: Mateo Flow, MD Primary Cardiologist:No primary care provider on file.  Chief Complaint:   Unstable angina  History of Present Illness:    The patient is an 84 year old male who is recently been evaluated by cardiology for progressive symptoms of angina.  He has a past medical history of atherosclerotic vascular disease including history of previous carotid endarterectomy.  Most recently he has been experiencing episodes of chest tightness with exertion which radiation into the left arm.  He does get relief with nitroglycerin from these episodes.  Other cardiac risk factors include dyslipidemia, history of TIAs that is post left carotid endarterectomy, and hypertension, to the progressive nature of the symptoms as well as significant risk factors it was felt that he should undergo cardiac catheterization which was done on today's date.  Please see the results described below.  He has multivessel coronary artery disease and we are requested to see in consultation for consideration of coronary artery surgical revascularization.    Current Activity/ Functional Status: Patient is independent with mobility/ambulation, transfers, ADL's, IADL's.   Zubrod Score: At the time of surgery this patient's most appropriate activity status/level should be described as: []     0    Normal activity, no symptoms [x]     1    Restricted in physical strenuous activity but ambulatory, able to do out light work []     2    Ambulatory and capable of self care, unable to do work activities, up and about                 more than 50%  Of the time                            []     3    Only limited self care, in bed greater than 50% of waking hours []     4    Completely disabled, no self care, confined  to bed or chair []     5    Moribund  Past Medical History:  Diagnosis Date  . Aortic calcification (HCC)    Seen on CT scan  . BPH (benign prostatic hypertrophy)   . CAD (coronary artery disease)    Coronary artery calcification noted on CT scan  . Cataract, right eye 1994   Extracted with lens implant  . History of TIAs 2011   Status post left carotid endarterectomy  . Hyperlipidemia   . Hypertension   . Kidney stones   . Left-sided carotid artery disease ([place]) 02/2010   Left carotid endarterectomy  . Pernicious anemia     Past Surgical History:  Procedure Laterality Date  . CAROTID ARTERY ANGIOPLASTY Left   . CAROTID ENDARTERECTOMY  03-06-2010   Left CEA  . CATARACT EXTRACTION W/ INTRAOCULAR LENS IMPLANT Right   . CHOLECYSTECTOMY    . LEFT HEART CATH AND CORONARY ANGIOGRAPHY N/A 03/30/2018   Procedure: LEFT HEART CATH AND CORONARY ANGIOGRAPHY;  Surgeon: Jettie Booze, MD;  Location: [facility];  Service: Cardiovascular;  Laterality: N/A;  . MELANOMA EXCISION      Social History   Tobacco  Use  Smoking Status Never Smoker  Smokeless Tobacco Never Used    Social History   Substance and Sexual Activity  Alcohol Use No     Allergies  Allergen Reactions  . Codeine Nausea And Vomiting    Current Facility-Administered Medications  Medication Dose Route Frequency Provider Last Rate Last Dose  . 0.9 %  sodium chloride infusion  250 mL Intravenous PRN Jettie Booze, MD      . acetaminophen (TYLENOL) tablet 650 mg  650 mg Oral Q4H PRN Jettie Booze, MD      . Derrill Memo ON 03/31/2018] aspirin EC tablet 81 mg  81 mg Oral Daily Jettie Booze, MD      . heparin ADULT infusion 100 units/mL (25000 units/275mL sodium chloride 0.45%)  1,100 Units/hr Intravenous Continuous Jettie Booze, MD      . Derrill Memo ON 03/31/2018] linaclotide (LINZESS) capsule 72 mcg  72 mcg Oral QAC breakfast Jettie Booze, MD      . nitroGLYCERIN (NITROSTAT) SL  tablet 0.4 mg  0.4 mg Sublingual Q5 min PRN Jettie Booze, MD      . ondansetron [facility]) injection 4 mg  4 mg Intravenous Q6H PRN Jettie Booze, MD      . simvastatin (ZOCOR) tablet 40 mg  40 mg Oral QHS Larae Grooms S, MD      . sodium chloride flush (NS) 0.9 % injection 3 mL  3 mL Intravenous Q12H Larae Grooms S, MD      . sodium chloride flush (NS) 0.9 % injection 3 mL  3 mL Intravenous PRN Jettie Booze, MD        Medications Prior to Admission  Medication Sig Dispense Refill Last Dose  . aspirin EC 81 MG tablet Take 81 mg by mouth daily.   03/30/2018 at 0530  . clopidogrel (PLAVIX) 75 MG tablet Take 75 mg by mouth daily.   03/30/2018 at 0530  . linaclotide (LINZESS) 72 MCG capsule Take 1 capsule (72 mcg total) by mouth daily before breakfast. 30 capsule 6 03/29/2018 at Unknown time  . Misc Natural Products (PROSTATE SUPPORT) 300-15 MG TABS Take 1 capsule by mouth daily.   03/29/2018 at Unknown time  . nitroGLYCERIN (NITROSTAT) 0.4 MG SL tablet Place 0.4 mg under the tongue every 5 (five) minutes as needed for chest pain.   03/29/2018 at Unknown time  . simvastatin (ZOCOR) 40 MG tablet Take 40 mg by mouth every morning.    03/29/2018 at Unknown time    Family History  Problem Relation Age of Onset  . Cancer Mother   . Stroke Father      Review of Systems:   Review of Systems  Constitutional: Positive for diaphoresis and malaise/fatigue. Negative for chills, fever and weight loss.  HENT: Positive for hearing loss. Negative for congestion, ear discharge, ear pain, nosebleeds, sinus pain, sore throat and tinnitus.   Eyes: Negative for blurred vision, double vision, photophobia, pain, discharge and redness.       + dry eyes  Respiratory: Positive for cough and shortness of breath. Negative for hemoptysis, sputum production, wheezing and stridor.   Cardiovascular: Positive for chest pain. Negative for palpitations, orthopnea, claudication, leg swelling and PND.    Gastrointestinal: Positive for constipation. Negative for abdominal pain, blood in stool, diarrhea, heartburn, melena, nausea and vomiting.  Genitourinary: Positive for frequency and urgency. Negative for dysuria, flank pain and hematuria.  Musculoskeletal: Positive for falls. Negative for back pain, joint pain, myalgias and  neck pain.  Skin: Negative for itching and rash.  Neurological: Positive for dizziness, loss of consciousness and weakness. Negative for tingling, tremors, sensory change, speech change, focal weakness, seizures and headaches.  Endo/Heme/Allergies: Negative for environmental allergies and polydipsia. Bruises/bleeds easily.  Psychiatric/Behavioral: Positive for memory loss. Negative for depression, hallucinations, substance abuse and suicidal ideas. The patient has insomnia. The patient is not nervous/anxious.         Physical Exam: BP (!) 148/69 (BP Location: Left Arm)   Pulse 64   Temp 98.1 F (36.7 C) (Temporal)   Resp 16   Ht 5\' 9"  (1.753 m)   Wt 68.5 kg   SpO2 99%   BMI 22.30 kg/m    Physical Exam  Constitutional: He appears healthy. No distress.  HENT:  Mouth/Throat: Dental caries present. Oropharynx is clear. Pharynx is normal.  Eyes: Pupils are equal, round, and reactive to light. Conjunctivae are normal.  Neck: Normal range of motion and thyroid normal. Neck supple. No JVD present. No neck adenopathy. No thyromegaly present.  Cardiovascular: Normal rate, regular rhythm, S1 normal, S2 normal, intact distal pulses and normal pulses.  Murmur heard.  Systolic murmur is present. Soft systolic murmur Pulmonary/Chest: Effort normal. No stridor. He has no wheezes. He has no rales. He exhibits no tenderness.  Abdominal: Soft. Bowel sounds are normal. He exhibits no distension and no mass. There is no hepatomegaly. There is no tenderness.  Musculoskeletal: He exhibits no edema, tenderness or deformity.  Neurological: He is alert and oriented to person, place,  and time. He has normal motor skills.  Skin: Skin is warm and dry. No rash noted. No cyanosis. There is pallor. No jaundice. Nails show no clubbing.    Diagnostic Studies & Laboratory data:     Recent Radiology Findings:   No results found.   I have independently reviewed the above radiologic studies and discussed with the patient   Conclusion     Mid Cx lesion is 90% stenosed.  Ost Cx to Prox Cx lesion is 75% stenosed.  Ost RCA to Prox RCA lesion is 80% stenosed.  Mid LAD lesion is 95% stenosed.  Prox LAD to Mid LAD lesion is 75% stenosed.  Ost 1st Diag to 1st Diag lesion is 75% stenosed.  Ost RPDA to RPDA lesion is 75% stenosed.  Ost LAD to Prox LAD lesion is 25% stenosed.  The left ventricular systolic function is normal.  LV end diastolic pressure is normal. LVEDP 3 mm Hg.  The left ventricular ejection fraction is 55-65% by visual estimate.  There is no aortic valve stenosis.   Severe three vessel CAD.  Hold Plavix.  Due to his significant accelerating angina and severe disease, will admit to hospital and start IV heparin after sheath pull.    CVTS consult.   Continue aspirin.     Indications   Coronary artery disease involving native coronary artery of native heart with unstable angina pectoris ([place]) [I25.110 (ICD-10-CM)]  Procedural Details/Technique   Technical Details The risks, benefits, and details of the procedure were explained to the patient. The patient verbalized understanding and wanted to proceed. Informed written consent was obtained.  PROCEDURE TECHNIQUE: After Xylocaine anesthesia a 28F slender sheath was placed in the right radial artery with a single anterior needle wall stick. IV Heparin was given. Right radial artery angio was done with the JR4 due to tortousity. Right coronary angiography was done using a Judkins R4 guide catheter. Left coronary angiography was done using a Judkins L3.5  guide catheter. Left ventriculography was done  using a JR4 catheter. A TR band was used for hemostasis.  Contrast: 70 cc     Estimated blood loss <50 mL.  During this procedure the patient was administered the following to achieve and maintain moderate conscious sedation: Versed 2 mg, Fentanyl 25 mcg, while the patient's heart rate, blood pressure, and oxygen saturation were continuously monitored. The period of conscious sedation was 25 minutes, of which I was present face-to-face 100% of this time.  Complications   Complications documented before study signed (03/30/2018 9:49 AM EDT)    No complications were associated with this study.  Documented by Jettie Booze, MD - 03/30/2018 9:46 AM EDT    Coronary Findings   Diagnostic  Dominance: Right  Left Anterior Descending  Ost LAD to Prox LAD lesion 25% stenosed  Ost LAD to Prox LAD lesion is 25% stenosed. The lesion is calcified.  Prox LAD to Mid LAD lesion 75% stenosed  Prox LAD to Mid LAD lesion is 75% stenosed.  Mid LAD lesion 95% stenosed  Mid LAD lesion is 95% stenosed.  First Diagonal Branch  Ost 1st Diag to 1st Diag lesion 75% stenosed  Ost 1st Diag to 1st Diag lesion is 75% stenosed.  Left Circumflex  Ost Cx to Prox Cx lesion 75% stenosed  Ost Cx to Prox Cx lesion is 75% stenosed.  Mid Cx lesion 90% stenosed  Mid Cx lesion is 90% stenosed.  Right Coronary Artery  Ost RCA to Prox RCA lesion 80% stenosed  Ost RCA to Prox RCA lesion is 80% stenosed. The lesion is calcified.  Right Posterior Descending Artery  Ost RPDA to RPDA lesion 75% stenosed  Ost RPDA to RPDA lesion is 75% stenosed.  Intervention   No interventions have been documented.  Wall Motion   Resting               Left Heart   Left Ventricle The left ventricular size is normal. The left ventricular systolic function is normal. LV end diastolic pressure is normal. The left ventricular ejection fraction is 55-65% by visual estimate. No regional wall motion abnormalities.  Aortic Valve  There is no aortic valve stenosis.  Coronary Diagrams   Diagnostic Diagram            Recent Lab Findings: Lab Results  Component Value Date   WBC 7.2 03/28/2018   HGB 13.6 03/28/2018   HCT 39.8 03/28/2018   PLT 257 03/28/2018   GLUCOSE 158 (H) 03/28/2018   ALT 20 03/05/2010   AST 26 03/05/2010   NA 139 03/28/2018   K 4.4 03/28/2018   CL 98 03/28/2018   CREATININE 1.00 03/28/2018   BUN 14 03/28/2018   CO2 25 03/28/2018   INR 0.88 03/05/2010      Assessment / Plan: The patient is a 84 year old male with unstable angina and severe three-vessel disease as noted by cardiac catheterization on today's date.  He is on aspirin and Plavix.  He will require a Plavix washout prior to proceeding with surgery but appears to be an adequate candidate.  He has required sublingual nitroglycerin on 2 occasions at home with good results of resolution of pain.  Surgery is tentatively scheduled for next Tuesday and he could potentially be discharged home prior to that if all agree.        I  spent 60 minutes counseling the patient face to face.  John Giovanni, PA-C 03/30/2018 3:08 PM Pager [phone]  Medical  record and cath films reviewed, patient interviewed and examined. Agree with the above evaluation by Jadene Pierini, PA. He has severe 3 vessel CAD with tight calcified LAD stenosis. This requires CABG for treatment and I think he should stay in the hospital on heparin until surgery on Tuesday. He lives 40 minutes away in [facility] and that is not safe with this degree of stenosis. I discussed the operative procedure with the patient  including alternatives, benefits and risks; including but not limited to bleeding, blood transfusion, infection, stroke, myocardial infarction, graft failure, heart block requiring a permanent pacemaker, organ dysfunction, and death.  [name] understands and agrees to proceed.

## 2018-03-30 NOTE — Interval H&P Note (Signed)
Cath Lab Visit (complete for each Cath Lab visit)  Clinical Evaluation Leading to the Procedure:   ACS: No.  Non-ACS:    Anginal Classification: CCS III  Anti-ischemic medical therapy: 1 medication  Non-Invasive Test Results: No non-invasive testing performed  Prior CABG: No previous CABG      History and Physical Interval Note:  03/30/2018 9:00 AM  Joseph Mcmillan  has presented today for surgery, with the diagnosis of [place]  The various methods of treatment have been discussed with the patient and family. After consideration of risks, benefits and other options for treatment, the patient has consented to  Procedure(s): LEFT HEART CATH AND CORONARY ANGIOGRAPHY (N/A) as a surgical intervention .  The patient's history has been reviewed, patient examined, no change in status, stable for surgery.  I have reviewed the patient's chart and labs.  Questions were answered to the patient's satisfaction.     Larae Grooms

## 2018-03-31 ENCOUNTER — Inpatient Hospital Stay (HOSPITAL_COMMUNITY): Payer: Medicare Other

## 2018-03-31 DIAGNOSIS — E785 Hyperlipidemia, unspecified: Secondary | ICD-10-CM

## 2018-03-31 DIAGNOSIS — Z01810 Encounter for preprocedural cardiovascular examination: Secondary | ICD-10-CM

## 2018-03-31 DIAGNOSIS — I25110 Atherosclerotic heart disease of native coronary artery with unstable angina pectoris: Secondary | ICD-10-CM

## 2018-03-31 LAB — CBC
HEMATOCRIT: 36.5 % — AB (ref 39.0–52.0)
Hemoglobin: 12.3 g/dL — ABNORMAL LOW (ref 13.0–17.0)
MCH: 30.8 pg (ref 26.0–34.0)
MCHC: 33.7 g/dL (ref 30.0–36.0)
MCV: 91.5 fL (ref 78.0–100.0)
PLATELETS: 213 10*3/uL (ref 150–400)
RBC: 3.99 MIL/uL — ABNORMAL LOW (ref 4.22–5.81)
RDW: 12.2 % (ref 11.5–15.5)
WBC: 7 10*3/uL (ref 4.0–10.5)

## 2018-03-31 LAB — LIPID PANEL
Cholesterol: 166 mg/dL (ref 0–200)
HDL: 63 mg/dL (ref >40)
LDL CALC: 84 mg/dL (ref 0–99)
Total CHOL/HDL Ratio: 2.6 RATIO
Triglycerides: 97 mg/dL (ref <150)
VLDL: 19 mg/dL (ref 0–40)

## 2018-03-31 LAB — GLUCOSE, CAPILLARY
GLUCOSE-CAPILLARY: 122 mg/dL — AB (ref 70–99)
GLUCOSE-CAPILLARY: 124 mg/dL — AB (ref 70–99)
Glucose-Capillary: 185 mg/dL — ABNORMAL HIGH (ref 70–99)
Glucose-Capillary: 161 mg/dL — ABNORMAL HIGH (ref 70–99)

## 2018-03-31 LAB — HEPARIN LEVEL (UNFRACTIONATED)
HEPARIN UNFRACTIONATED: 0.88 [IU]/mL — AB (ref 0.30–0.70)
Heparin Unfractionated: 0.76 IU/mL — ABNORMAL HIGH (ref 0.30–0.70)

## 2018-03-31 LAB — ECHOCARDIOGRAM COMPLETE
Height: 69 in
Weight: 2342.4 oz

## 2018-03-31 NOTE — Progress Notes (Signed)
1 Day Post-Op Procedure(s) (LRB): LEFT HEART CATH AND CORONARY ANGIOGRAPHY (N/A) Subjective:  No chest pain or dyspnea  Objective: Vital signs in last 24 hours: Temp:  [98 F (36.7 C)-98.6 F (37 C)] 98.6 F (37 C) (08/09 1201) Pulse Rate:  [61-71] 65 (08/09 1201) Cardiac Rhythm: Normal sinus rhythm (08/09 1040) Resp:  [16-21] 18 (08/09 1201) BP: (129-163)/(61-87) 153/81 (08/09 1201) SpO2:  [95 %-100 %] 95 % (08/09 1201) Weight:  [66.4 kg] 66.4 kg (08/09 0500)  Hemodynamic parameters for last 24 hours:    Intake/Output from previous day: 08/08 0701 - 08/09 0700 In: 495 [P.O.:360; I.V.:135] Out: 1300 [Urine:1300] Intake/Output this shift: Total I/O In: 292.3 [P.O.:240; I.V.:52.3] Out: 300 [Urine:300]  General appearance: alert and cooperative Heart: regular rate and rhythm, S1, S2 normal, no murmur, click, rub or gallop Lungs: clear to auscultation bilaterally  Lab Results: Recent Labs    03/31/18 0454  WBC 7.0  HGB 12.3*  HCT 36.5*  PLT 213   BMET: No results for input(s): NA, K, CL, CO2, GLUCOSE, BUN, CREATININE, CALCIUM in the last 72 hours.  PT/INR: No results for input(s): LABPROT, INR in the last 72 hours. ABG No results found for: PHART, HCO3, TCO2, ACIDBASEDEF, O2SAT CBG (last 3)  Recent Labs    03/30/18 2154 03/31/18 0749 03/31/18 1101  GLUCAP 140* 122* 124*    Assessment/Plan: S/P Procedure(s) (LRB): LEFT HEART CATH AND CORONARY ANGIOGRAPHY (N/A) Severe multi-vessel CAD Plan CABG Tuesday after Plavix washout. His wife is with him today.  I discussed the operative procedure with the patient and his wife including alternatives, benefits and risks; including but not limited to bleeding, blood transfusion, infection, stroke, myocardial infarction, graft failure, heart block requiring a permanent pacemaker, organ dysfunction, and death.  [name] understands and agrees to proceed.     LOS: 1 day    Gaye Pollack 03/31/2018

## 2018-03-31 NOTE — Plan of Care (Signed)
  Problem: Education: Goal: Knowledge of General Education information will improve Description: Including pain rating scale, medication(s)/side effects and non-pharmacologic comfort measures Outcome: Completed/Met   Problem: Clinical Measurements: Goal: Respiratory complications will improve Outcome: Completed/Met   Problem: Nutrition: Goal: Adequate nutrition will be maintained Outcome: Completed/Met   

## 2018-03-31 NOTE — Progress Notes (Signed)
ANTICOAGULATION CONSULT NOTE - Follow Up Consult  Pharmacy Consult for Heparin Indication: Severe 3 vessel CAD  Allergies  Allergen Reactions  . Codeine Nausea And Vomiting    Patient Measurements: Height: 5\' 9"  (175.3 cm) Weight: 146 lb 6.4 oz (66.4 kg) IBW/kg (Calculated) : 70.7  Vital Signs: Temp: 98 F (36.7 C) (08/09 0526) Temp Source: Oral (08/09 0526) BP: 156/73 (08/09 0526) Pulse Rate: 61 (08/09 0526)  Labs: Recent Labs    03/28/18 1222 03/31/18 0454  HGB 13.6 12.3*  HCT 39.8 36.5*  PLT 257 213  HEPARINUNFRC  --  0.76*  CREATININE 1.00  --     Estimated Creatinine Clearance: 54.4 mL/min (by C-G formula based on SCr of 1 mg/dL).   Assessment: 84 year old male s/p cath found to have severe 3 vessel disease to begin heparin while holding Plavix and awaiting CABG. Initial heparin level slightly supratherapeutic at 0.76, CBC stable.  Goal of Therapy:  Heparin level 0.3-0.7 units/ml Monitor platelets by anticoagulation protocol: Yes   Plan:  Reduce heparin to 1000 units/hr Recheck 8hr heparin level  Arrie Senate, PharmD, BCPS Clinical Pharmacist [phone] Please check AMION for all [place] numbers 03/31/2018

## 2018-03-31 NOTE — Progress Notes (Signed)
Initial Nutrition Assessment  DOCUMENTATION CODES:   Non-severe (moderate) malnutrition in context of acute illness/injury  INTERVENTION:   - Continue Ensure Enlive po BID, each supplement provides 350 kcal and 20 grams of protein (vanilla or strawberry flavor)  NUTRITION DIAGNOSIS:   Moderate Malnutrition related to acute illness, early satiety (progressive angina) as evidenced by mild fat depletion, mild muscle depletion, moderate muscle depletion.  GOAL:   Patient will meet greater than or equal to 90% of their needs  MONITOR:   PO intake, Supplement acceptance, Weight trends, Labs  REASON FOR ASSESSMENT:   Malnutrition Screening Tool    ASSESSMENT:   84 year old male who presented s/p L heart cath and coronary angiography. PMH significant for atherosclerotic vascular disease s/p carotid endarterectomy, hypertension, and hyperlipidemia. Pt has recently been evaluated by cardiology for progressive symptoms of angina. Pt found to have severe 3 vessel CAD with tight calcified LAD stenosis. Pt is awaiting CABG next Tuesday.  Spoke with pt and family member at bedside.  Pt reports that he has recently started experiencing early satiety. Pt states that he does have an appetite but is not able to eat much when he sits down to a meal. Pt's family members reports that pt "does not eat enough protein."  Pt states that he eats 3 meals daily and drinks "lots of water" and coffee.  B: bowl of cereal with banana, blueberries, and strawberries L: leftovers D: fast food hamburger or [employer] roast beef sandwich  Pt endorses recent weight loss, stating that his UBW is 149-152 lbs. Pt believes he has lost about 3 lbs. Per weight history in chart, pt has lost 9 lbs in less than 4 months (5.8% weight loss, not significant for timeframe).  Pt reports that he likes the Ensure Enlive oral nutrition supplements that he has been receiving. Pt also asked RD to order lunch tray: grilled chicken  sandwich, potato chips, coffee, and diet coke. Pt eating a bacon, egg, and cheese biscuit at end of RD visit.  Meal Completion: 100%  Medications reviewed and include: Ensure Enlive BID, IV heparin  Labs reviewed. CBG's: 122-144  UOP: 1300 ml since admission  NUTRITION - FOCUSED PHYSICAL EXAM:    Most Recent Value  Orbital Region  Mild depletion  Upper Arm Region  Mild depletion  Thoracic and Lumbar Region  No depletion  Buccal Region  Mild depletion  Temple Region  Mild depletion  Clavicle Bone Region  Mild depletion  Clavicle and Acromion Bone Region  Mild depletion  Scapular Bone Region  Unable to assess  Dorsal Hand  Mild depletion  Patellar Region  Moderate depletion  Anterior Thigh Region  Moderate depletion  Posterior Calf Region  Mild depletion  Edema (RD Assessment)  None  Hair  Reviewed  Eyes  Reviewed  Mouth  Reviewed  Skin  Reviewed  Nails  Reviewed       Diet Order:   Diet Order            Diet Carb Modified Fluid consistency: Thin; Room service appropriate? Yes  Diet effective now              EDUCATION NEEDS:   No education needs have been identified at this time  Skin:  Skin Assessment: Reviewed RN Assessment(closed incision to R wrist)  Last BM:  unknown/PTA  Height:   Ht Readings from Last 1 Encounters:  03/30/18 5\' 9"  (1.753 m)    Weight:   Wt Readings from Last 1 Encounters:  03/31/18  66.4 kg    Ideal Body Weight:  72.73 kg  BMI:  Body mass index is 21.62 kg/m.  Estimated Nutritional Needs:   Kcal:  1650-1850 kcal  Protein:  80-95 grams  Fluid:  >/= 1.7 L    Gaynell Face, MS, RD, LDN Pager: [phone] Weekend/After Hours: [phone]

## 2018-03-31 NOTE — Progress Notes (Signed)
  Echocardiogram 2D Echocardiogram has been performed.  Joseph Mcmillan M 03/31/2018, 8:28 AM

## 2018-03-31 NOTE — Progress Notes (Signed)
Pre-op Cardiac Surgery  Carotid Findings:  Bilateral - 1%to 39% ICA stenosis  Upper Extremity Right Left  Brachial Pressures 139 Triphasic 148 Triphasic  Radial Waveforms Triphasic Triphasic  Ulnar Waveforms Triphasic Triphasic  Palmar Arch (Allen's Test) Normal Abnormal   Findings:  Palmar arch evaluation - Doppler waveforms remained normal on the right with both radial and ulnar compressions. Left Doppler waveforms remained normal with radial compression and diminished greater than 50% with ulnar compression.    Lower  Extremity Right Left  Dorsalis Pedis 139 Triphasic 149 Triphasic  Posterior Tibial 144 triphasic 165 Triphasic  Ankle/Brachial Indices 0.97 1.11    Findings:  ABIs and Doppler waveforms indicate normal arterial flow bilaterally at rest  [facility], [place] 03/31/2018, 10:42 AM

## 2018-03-31 NOTE — Progress Notes (Signed)
ANTICOAGULATION CONSULT NOTE - Follow Up Consult  Pharmacy Consult for Heparin Indication: severe 3 vessel CAD, awaiting CABG  Allergies  Allergen Reactions  . Codeine Nausea And Vomiting    Patient Measurements: Height: 5\' 9"  (175.3 cm) Weight: 146 lb 6.4 oz (66.4 kg) IBW/kg (Calculated) : 70.7 Heparin Dosing Weight: 66.4 kg  Vital Signs: Temp: 98.4 F (36.9 C) (08/09 1631) Temp Source: Oral (08/09 1631) BP: 125/57 (08/09 1631) Pulse Rate: 68 (08/09 1631)  Labs: Recent Labs    03/31/18 0454 03/31/18 1705  HGB 12.3*  --   HCT 36.5*  --   PLT 213  --   HEPARINUNFRC 0.76* 0.88*    Estimated Creatinine Clearance: 54.4 mL/min (by C-G formula based on SCr of 1 mg/dL).  Assessment:  84 yr old male s/p cardiac cath 8/8, found to have severe 3 vessel CAD. CABG planned 8/13 after Plavix washout.  Continues on IV heparin.     Heparin level supratherapeutic (0.88) on 1000 units/hr.  Level trended up after decrease from 1100 units/hr this am when level as 0.76.  No infusion problems, no bleeding reported.  Goal of Therapy:  Heparin level 0.3-0.7 units/ml Monitor platelets by anticoagulation protocol: Yes   Plan:   Decrease heparin drip to 850 units/hr  Next heparin level and CBC ~8hr after rate change.  CABG scheduled for 04/04/18.  Arty Baumgartner, [place] Pager: [phone] 03/31/2018,7:17 PM

## 2018-03-31 NOTE — Progress Notes (Addendum)
Progress Note  Patient Name: Joseph Mcmillan Date of Encounter: 03/31/2018  Primary Cardiologist: Jenean Lindau, MD   Subjective   Pt denies chest pain. CABG scheduled for next Tues  Inpatient Medications    Scheduled Meds: . aspirin EC  81 mg Oral Daily  . feeding supplement (ENSURE ENLIVE)  237 mL Oral BID BM  . linaclotide  72 mcg Oral QAC breakfast  . simvastatin  40 mg Oral QHS  . sodium chloride flush  3 mL Intravenous Q12H   Continuous Infusions: . sodium chloride    . heparin 1,100 Units/hr (03/30/18 1811)   PRN Meds: sodium chloride, acetaminophen, nitroGLYCERIN, ondansetron (ZOFRAN) IV, ramelteon, sodium chloride flush   Vital Signs    Vitals:   03/30/18 2009 03/31/18 0042 03/31/18 0500 03/31/18 0526  BP: (!) 161/75 129/61  (!) 156/73  Pulse: 63 70  61  Resp: 16 (!) 21  17  Temp: 98.5 F (36.9 C) 98.6 F (37 C)  98 F (36.7 C)  TempSrc: Oral Oral  Oral  SpO2: 98% 96%  98%  Weight:   66.4 kg   Height:        Intake/Output Summary (Last 24 hours) at 03/31/2018 0812 Last data filed at 03/31/2018 0656 Gross per 24 hour  Intake 495 ml  Output 1300 ml  Net -805 ml   Filed Weights   03/30/18 0716 03/31/18 0500  Weight: 68.5 kg 66.4 kg    Telemetry    Sinus with HR in the low 60s - Personally Reviewed  ECG    No new tracings - Personally Reviewed  Physical Exam   GEN: No acute distress.   Neck: No JVD Cardiac: RRR, no murmurs, rubs, or gallops.  Respiratory: Clear to auscultation bilaterally. GI: Soft, nontender, non-distended  MS: No edema; No deformity. Right radial access site C/D/I, no hematoma Neuro:  Nonfocal  Psych: Normal affect   Labs    Chemistry Recent Labs  Lab 03/28/18 1222  NA 139  K 4.4  CL 98  CO2 25  GLUCOSE 158*  BUN 14  CREATININE 1.00  CALCIUM 9.4  GFRNONAA 70  GFRAA 81     Hematology Recent Labs  Lab 03/28/18 1222 03/31/18 0454  WBC 7.2 7.0  RBC 4.33 3.99*  HGB 13.6 12.3*  HCT 39.8 36.5*  MCV 92  91.5  MCH 31.4 30.8  MCHC 34.2 33.7  RDW 13.9 12.2  PLT 257 213    Cardiac EnzymesNo results for input(s): TROPONINI in the last 168 hours. No results for input(s): TROPIPOC in the last 168 hours.   BNPNo results for input(s): BNP, PROBNP in the last 168 hours.   DDimer No results for input(s): DDIMER in the last 168 hours.   Radiology    No results found.  Cardiac Studies   Left heart cath 03/30/18:  Mid Cx lesion is 90% stenosed.  Ost Cx to Prox Cx lesion is 75% stenosed.  Ost RCA to Prox RCA lesion is 80% stenosed.  Mid LAD lesion is 95% stenosed.  Prox LAD to Mid LAD lesion is 75% stenosed.  Ost 1st Diag to 1st Diag lesion is 75% stenosed.  Ost RPDA to RPDA lesion is 75% stenosed.  Ost LAD to Prox LAD lesion is 25% stenosed.  The left ventricular systolic function is normal.  LV end diastolic pressure is normal. LVEDP 3 mm Hg.  The left ventricular ejection fraction is 55-65% by visual estimate.  There is no aortic valve stenosis.  Severe three vessel CAD.  Hold Plavix.  Due to his significant accelerating angina and severe disease, will admit to hospital and start IV heparin after sheath pull.    CVTS consult.   Continue aspirin.    Patient Profile     84 y.o. male with a PMH significant for atherosclerotic vascular disease and is s/p carotid endarterectomy, presenting to clinic with chest tightness concerning for angina. Heart cath with 3 vessel disease, TCTS consulted yesterday.   Assessment & Plan    1. Multivessel CAD, TCTS consult - will need plavix washout - CABG scheduled for next Tues 04/04/18 - he denies chest pain, but states he has felt fatigued since Jan 2019 - heparin drip running - have held beta blocker for heart rate in the low 60s   2. HLD - on zocor 40 mg - will obtain lipid profile this morning - will likely need to switch to high intensity statin   3. S/P CEA - plavix on hold    For questions or updates, please  contact [place] Please consult [email or web address] for contact info under Cardiology/STEMI.      Signed, Tami Lin Arkel Cartwright, PA  03/31/2018, 8:12 AM

## 2018-04-01 LAB — CBC
HEMATOCRIT: 35.6 % — AB (ref 39.0–52.0)
HEMOGLOBIN: 12.3 g/dL — AB (ref 13.0–17.0)
MCH: 31.1 pg (ref 26.0–34.0)
MCHC: 34.6 g/dL (ref 30.0–36.0)
MCV: 90.1 fL (ref 78.0–100.0)
PLATELETS: 212 10*3/uL (ref 150–400)
RBC: 3.95 MIL/uL — AB (ref 4.22–5.81)
RDW: 12.2 % (ref 11.5–15.5)
WBC: 6.8 10*3/uL (ref 4.0–10.5)

## 2018-04-01 LAB — HEPARIN LEVEL (UNFRACTIONATED)
Heparin Unfractionated: 0.49 IU/mL (ref 0.30–0.70)
Heparin Unfractionated: 0.47 IU/mL (ref 0.30–0.70)

## 2018-04-01 LAB — GLUCOSE, CAPILLARY
GLUCOSE-CAPILLARY: 118 mg/dL — AB (ref 70–99)
GLUCOSE-CAPILLARY: 167 mg/dL — AB (ref 70–99)
GLUCOSE-CAPILLARY: 172 mg/dL — AB (ref 70–99)
Glucose-Capillary: 148 mg/dL — ABNORMAL HIGH (ref 70–99)

## 2018-04-01 NOTE — Progress Notes (Signed)
   Progress Note  Patient Name: Joseph Mcmillan Date of Encounter: 04/01/2018  Primary Cardiologist: Jenean Lindau, MD   Subjective   No chest pain or dyspnea  Inpatient Medications    Scheduled Meds: . aspirin EC  81 mg Oral Daily  . feeding supplement (ENSURE ENLIVE)  237 mL Oral BID BM  . linaclotide  72 mcg Oral QAC breakfast  . simvastatin  40 mg Oral QHS  . sodium chloride flush  3 mL Intravenous Q12H   Continuous Infusions: . sodium chloride    . heparin 850 Units/hr (03/31/18 1857)   PRN Meds: sodium chloride, acetaminophen, nitroGLYCERIN, ondansetron (ZOFRAN) IV, ramelteon, sodium chloride flush   Vital Signs    Vitals:   03/31/18 1201 03/31/18 1631 03/31/18 1943 04/01/18 0554  BP: (!) 153/81 (!) 125/57 (!) 138/59 (!) 153/71  Pulse: 65 68 66 62  Resp: 18 16 18 14   Temp: 98.6 F (37 C) 98.4 F (36.9 C) 98.4 F (36.9 C) 98.4 F (36.9 C)  TempSrc: Oral Oral Oral Oral  SpO2: 95% 96% 96% 98%  Weight:    64.9 kg  Height:        Intake/Output Summary (Last 24 hours) at 04/01/2018 1018 Last data filed at 04/01/2018 0759 Gross per 24 hour  Intake 571.17 ml  Output 550 ml  Net 21.17 ml   Filed Weights   03/30/18 0716 03/31/18 0500 04/01/18 0554  Weight: 68.5 kg 66.4 kg 64.9 kg    Telemetry    Sinus with occasional PVC - Personally Reviewed   Physical Exam   GEN: No acute distress.   Neck: No JVD Cardiac: RRR, no murmurs, rubs, or gallops.  Respiratory: Clear to auscultation bilaterally. GI: Soft, nontender, non-distended  MS: No edema Neuro:  Nonfocal  Psych: Normal affect   Labs    Chemistry Recent Labs  Lab 03/28/18 1222  NA 139  K 4.4  CL 98  CO2 25  GLUCOSE 158*  BUN 14  CREATININE 1.00  CALCIUM 9.4  GFRNONAA 70  GFRAA 81     Hematology Recent Labs  Lab 03/28/18 1222 03/31/18 0454 04/01/18 0244  WBC 7.2 7.0 6.8  RBC 4.33 3.99* 3.95*  HGB 13.6 12.3* 12.3*  HCT 39.8 36.5* 35.6*  MCV 92 91.5 90.1  MCH 31.4 30.8 31.1    MCHC 34.2 33.7 34.6  RDW 13.9 12.2 12.2  PLT 257 213 212    Patient Profile     84 y.o. male with a PMH significant for atherosclerotic vascular disease and is s/p carotid endarterectomy, presenting to clinic with chest tightness concerning for angina. Heart cath with 3 vessel disease, TCTS consulted yesterday.   Assessment & Plan    1 coronary artery disease -plan to continue aspirin, heparin and statin.  Add metoprolol 12.5 mg twice daily.  Plan for surgery on Tuesday.  2 possible diabetes mellitus-check hemoglobin A1c.  3 hyperlipidemia-continue statin.  4 hypertension-patient's blood pressure has been elevated.  Add metoprolol and follow.  5 prior carotid artery disease-continue aspirin and statin.  For questions or updates, please contact [place] Please consult [email or web address] for contact info under Cardiology/STEMI.      Signed, Kirk Ruths, MD  04/01/2018, 10:18 AM

## 2018-04-01 NOTE — Plan of Care (Signed)
  Problem: Activity: Goal: Risk for activity intolerance will decrease Outcome: Progressing   Problem: Coping: Goal: Level of anxiety will decrease Outcome: Completed/Met

## 2018-04-01 NOTE — Progress Notes (Signed)
ANTICOAGULATION CONSULT NOTE - Follow Up Consult  Pharmacy Consult for Heparin Indication: CAD, awaiting CABG  Allergies  Allergen Reactions  . Codeine Nausea And Vomiting    Patient Measurements: Height: 5\' 9"  (175.3 cm) Weight: 143 lb 1.6 oz (64.9 kg) IBW/kg (Calculated) : 70.7 Heparin Dosing Weight: 64.9 kg  Vital Signs: Temp: 98.4 F (36.9 C) (08/10 0554) Temp Source: Oral (08/10 0554) BP: 153/71 (08/10 0554) Pulse Rate: 62 (08/10 0554)  Labs: Recent Labs    03/31/18 0454 03/31/18 1705 04/01/18 0244 04/01/18 1044  HGB 12.3*  --  12.3*  --   HCT 36.5*  --  35.6*  --   PLT 213  --  212  --   HEPARINUNFRC 0.76* 0.88* 0.49 0.47    Estimated Creatinine Clearance: 53.2 mL/min (by C-G formula based on SCr of 1 mg/dL).   Medications:  Scheduled:  . aspirin EC  81 mg Oral Daily  . feeding supplement (ENSURE ENLIVE)  237 mL Oral BID BM  . linaclotide  72 mcg Oral QAC breakfast  . simvastatin  40 mg Oral QHS  . sodium chloride flush  3 mL Intravenous Q12H   Infusions:  . sodium chloride    . heparin 850 Units/hr (03/31/18 1857)    Assessment: 84 yo M continues on heparin for CAD.  Pt s/p cath showing mutli vessel disease awaiting CABG.  Heparin therapeutic on current rate.  Goal of Therapy:  Heparin level 0.3-0.7 units/ml Monitor platelets by anticoagulation protocol: Yes   Plan:  Continue heparin at 850 units/hr Daily heparin level and CBC  Leena Tiede, Pharm.D., BCPS Clinical Pharmacist Pager: [phone] Clinical phone for 04/01/2018 from 8:30-4:00 is [phone].  **Pharmacist phone directory can now be found on [email or web address] (PW [email or web address]).  Listed under [place].  04/01/2018 12:55 PM

## 2018-04-01 NOTE — Progress Notes (Signed)
CARDIAC REHAB PHASE I   Completed pre-op education with pt and daughter. Pt given IS and demonstrated 1750. Encouraged use before surgery to prepare lungs. Pt given in-the-tube sheet and explained importance of sternal precautions. Pt understands need for 24/7 care the first week post discharge and states his wife will be with him. Pt given [facility] care guide and cardiac surgery booklet. Will continue to follow throughout stay.  [id number] Rufina Falco, RN BSN 04/01/2018 10:44 AM

## 2018-04-01 NOTE — Progress Notes (Signed)
ANTICOAGULATION CONSULT NOTE - Follow Up Consult  Pharmacy Consult for heparin Indication: CAD awaiting CABG  Labs: Recent Labs    03/31/18 0454 03/31/18 1705 04/01/18 0244  HGB 12.3*  --  12.3*  HCT 36.5*  --  35.6*  PLT 213  --  212  HEPARINUNFRC 0.76* 0.88* 0.49    Assessment/Plan:  83yo male therapeutic on heparin after rate changes. Will continue gtt at current rate and confirm stable with additional level.   Wynona Neat, PharmD, BCPS  04/01/2018,3:22 AM

## 2018-04-02 LAB — CBC
HEMATOCRIT: 37.6 % — AB (ref 39.0–52.0)
Hemoglobin: 12.6 g/dL — ABNORMAL LOW (ref 13.0–17.0)
MCH: 30.7 pg (ref 26.0–34.0)
MCHC: 33.5 g/dL (ref 30.0–36.0)
MCV: 91.5 fL (ref 78.0–100.0)
Platelets: 195 10*3/uL (ref 150–400)
RBC: 4.11 MIL/uL — ABNORMAL LOW (ref 4.22–5.81)
RDW: 12.5 % (ref 11.5–15.5)
WBC: 7.5 10*3/uL (ref 4.0–10.5)

## 2018-04-02 LAB — HEPARIN LEVEL (UNFRACTIONATED): Heparin Unfractionated: 0.44 IU/mL (ref 0.30–0.70)

## 2018-04-02 LAB — GLUCOSE, CAPILLARY
GLUCOSE-CAPILLARY: 158 mg/dL — AB (ref 70–99)
Glucose-Capillary: 124 mg/dL — ABNORMAL HIGH (ref 70–99)
Glucose-Capillary: 148 mg/dL — ABNORMAL HIGH (ref 70–99)
Glucose-Capillary: 176 mg/dL — ABNORMAL HIGH (ref 70–99)

## 2018-04-02 MED ORDER — METOPROLOL TARTRATE 12.5 MG HALF TABLET
12.5000 mg | ORAL_TABLET | Freq: Two times a day (BID) | ORAL | Status: DC
Start: 2018-04-02 — End: 2018-04-04
  Administered 2018-04-02 – 2018-04-03 (×4): 12.5 mg via ORAL
  Filled 2018-04-02 (×4): qty 1

## 2018-04-02 NOTE — Plan of Care (Signed)
  Problem: Pain Managment: Goal: General experience of comfort will improve Outcome: Progressing   Problem: Elimination: Goal: Will not experience complications related to urinary retention Outcome: Completed/Met

## 2018-04-02 NOTE — Progress Notes (Signed)
   Progress Note  Patient Name: Joseph Mcmillan Date of Encounter: 04/02/2018  Primary Cardiologist: Jenean Lindau, MD   Subjective   Pt denies CP or dyspnea  Inpatient Medications    Scheduled Meds: . aspirin EC  81 mg Oral Daily  . feeding supplement (ENSURE ENLIVE)  237 mL Oral BID BM  . linaclotide  72 mcg Oral QAC breakfast  . simvastatin  40 mg Oral QHS  . sodium chloride flush  3 mL Intravenous Q12H   Continuous Infusions: . sodium chloride    . heparin 850 Units/hr (03/31/18 1857)   PRN Meds: sodium chloride, acetaminophen, nitroGLYCERIN, ondansetron (ZOFRAN) IV, ramelteon, sodium chloride flush   Vital Signs    Vitals:   04/01/18 0554 04/01/18 1345 04/01/18 2052 04/02/18 0441  BP: (!) 153/71 139/73 (!) 154/72 (!) 156/77  Pulse: 62 73 70 67  Resp: 14 16 20 18   Temp: 98.4 F (36.9 C) 97.6 F (36.4 C) 97.8 F (36.6 C) 98.2 F (36.8 C)  TempSrc: Oral Oral Oral Oral  SpO2: 98% 98% 97% 97%  Weight: 64.9 kg   66.2 kg  Height:        Intake/Output Summary (Last 24 hours) at 04/02/2018 1053 Last data filed at 04/02/2018 0955 Gross per 24 hour  Intake 486.33 ml  Output 1435 ml  Net -948.67 ml   Filed Weights   03/31/18 0500 04/01/18 0554 04/02/18 0441  Weight: 66.4 kg 64.9 kg 66.2 kg    Telemetry    Sinus with occasional PVC - Personally Reviewed   Physical Exam   GEN: No acute distress.  WD/WN Neck: No JVD, supple Cardiac: RRR Respiratory: Clear to auscultation bilaterally; no wheeze. GI: Soft, NT/ND MS: No edema Neuro:  Grossly intact   Labs    Chemistry Recent Labs  Lab 03/28/18 1222  NA 139  K 4.4  CL 98  CO2 25  GLUCOSE 158*  BUN 14  CREATININE 1.00  CALCIUM 9.4  GFRNONAA 70  GFRAA 81     Hematology Recent Labs  Lab 03/31/18 0454 04/01/18 0244 04/02/18 0427  WBC 7.0 6.8 7.5  RBC 3.99* 3.95* 4.11*  HGB 12.3* 12.3* 12.6*  HCT 36.5* 35.6* 37.6*  MCV 91.5 90.1 91.5  MCH 30.8 31.1 30.7  MCHC 33.7 34.6 33.5  RDW 12.2  12.2 12.5  PLT 213 212 195    Patient Profile     84 y.o. male with a PMH significant for atherosclerotic vascular disease and is s/p carotid endarterectomy, presenting to clinic with chest tightness concerning for angina. Heart cath with 3 vessel disease, TCTS consulted yesterday.   Assessment & Plan    1 coronary artery disease -plan to continue aspirin, metoprolol, heparin and statin. Plan for surgery on Tuesday.  2 possible diabetes mellitus-await hemoglobin A1c.  3 hyperlipidemia-continue zocor.  4 hypertension-patient's blood pressure has been elevated.  Continue metoprolol and follow.  May need additional medications following surgery.  5 prior carotid artery disease-continue aspirin and statin.  For questions or updates, please contact [place] Please consult [email or web address] for contact info under Cardiology/STEMI.      Signed, Kirk Ruths, MD  04/02/2018, 10:53 AM

## 2018-04-02 NOTE — Progress Notes (Signed)
ANTICOAGULATION CONSULT NOTE - Follow Up Consult  Pharmacy Consult for Heparin Indication: CAD, awaiting CABG  Allergies  Allergen Reactions  . Codeine Nausea And Vomiting    Patient Measurements: Height: 5\' 9"  (175.3 cm) Weight: 145 lb 14.4 oz (66.2 kg) IBW/kg (Calculated) : 70.7 Heparin Dosing Weight: 64.9 kg  Vital Signs: Temp: 98.2 F (36.8 C) (08/11 0441) Temp Source: Oral (08/11 0441) BP: 156/77 (08/11 0441) Pulse Rate: 67 (08/11 0441)  Labs: Recent Labs    03/31/18 0454  04/01/18 0244 04/01/18 1044 04/02/18 0427  HGB 12.3*  --  12.3*  --  12.6*  HCT 36.5*  --  35.6*  --  37.6*  PLT 213  --  212  --  195  HEPARINUNFRC 0.76*   < > 0.49 0.47 0.44   < > = values in this interval not displayed.    Estimated Creatinine Clearance: 54.2 mL/min (by C-G formula based on SCr of 1 mg/dL).   Medications:  Scheduled:  . aspirin EC  81 mg Oral Daily  . feeding supplement (ENSURE ENLIVE)  237 mL Oral BID BM  . linaclotide  72 mcg Oral QAC breakfast  . simvastatin  40 mg Oral QHS  . sodium chloride flush  3 mL Intravenous Q12H   Infusions:  . sodium chloride    . heparin 850 Units/hr (03/31/18 1857)    Assessment: 84 yo M continues on heparin for CAD.  Pt s/p cath showing mutli vessel disease awaiting CABG.  Heparin therapeutic on current rate.  Goal of Therapy:  Heparin level 0.3-0.7 units/ml Monitor platelets by anticoagulation protocol: Yes   Plan:  Continue heparin at 850 units/hr Daily heparin level and CBC Will also order BMET for AM (last one 8/6)  [employer], Pharm.D., BCPS Clinical Pharmacist Pager: [phone] Clinical phone for 04/02/2018 from 8:30-4:00 is [phone].  **Pharmacist phone directory can now be found on [email or web address] (PW [email or web address]).  Listed under [place].  04/02/2018 11:34 AM

## 2018-04-03 ENCOUNTER — Encounter (HOSPITAL_COMMUNITY): Payer: Self-pay | Admitting: Anesthesiology

## 2018-04-03 ENCOUNTER — Inpatient Hospital Stay (HOSPITAL_COMMUNITY): Payer: Medicare Other

## 2018-04-03 LAB — PULMONARY FUNCTION TEST
DL/VA % PRED: 77 %
DL/VA: 3.52 ml/min/mmHg/L
DLCO COR % PRED: 59 %
DLCO cor: 18.31 ml/min/mmHg
DLCO unc % pred: 55 %
DLCO unc: 17.06 ml/min/mmHg
FEF 25-75 POST: 2.4 L/s
FEF 25-75 Pre: 1.79 L/sec
FEF2575-%CHANGE-POST: 33 %
FEF2575-%Pred-Post: 134 %
FEF2575-%Pred-Pre: 100 %
FEV1-%CHANGE-POST: 7 %
FEV1-%Pred-Post: 98 %
FEV1-%Pred-Pre: 91 %
FEV1-POST: 2.63 L
FEV1-Pre: 2.44 L
FEV1FVC-%Change-Post: 7 %
FEV1FVC-%PRED-PRE: 104 %
FEV6-%Change-Post: 3 %
FEV6-%PRED-PRE: 90 %
FEV6-%Pred-Post: 93 %
FEV6-POST: 3.3 L
FEV6-Pre: 3.18 L
FEV6FVC-%Change-Post: 3 %
FEV6FVC-%PRED-POST: 107 %
FEV6FVC-%Pred-Pre: 103 %
FVC-%Change-Post: 0 %
FVC-%PRED-POST: 87 %
FVC-%PRED-PRE: 87 %
FVC-POST: 3.3 L
FVC-PRE: 3.29 L
PRE FEV6/FVC RATIO: 97 %
Post FEV1/FVC ratio: 80 %
Post FEV6/FVC ratio: 100 %
Pre FEV1/FVC ratio: 74 %
RV % pred: 108 %
RV: 2.86 L
TLC % PRED: 82 %
TLC: 5.63 L

## 2018-04-03 LAB — BLOOD GAS, ARTERIAL
Acid-Base Excess: 2.6 mmol/L — ABNORMAL HIGH (ref 0.0–2.0)
Bicarbonate: 26.6 mmol/L (ref 20.0–28.0)
Drawn by: 249101
O2 Saturation: 95.1 %
PATIENT TEMPERATURE: 98.6
PO2 ART: 73.8 mmHg — AB (ref 83.0–108.0)
pCO2 arterial: 41.3 mmHg (ref 32.0–48.0)
pH, Arterial: 7.426 (ref 7.350–7.450)

## 2018-04-03 LAB — BASIC METABOLIC PANEL
Anion gap: 9 (ref 5–15)
BUN: 18 mg/dL (ref 8–23)
CHLORIDE: 103 mmol/L (ref 98–111)
CO2: 26 mmol/L (ref 22–32)
CREATININE: 1.03 mg/dL (ref 0.61–1.24)
Calcium: 9.1 mg/dL (ref 8.9–10.3)
GFR calc non Af Amer: >60 mL/min (ref >60)
Glucose, Bld: 132 mg/dL — ABNORMAL HIGH (ref 70–99)
POTASSIUM: 3.9 mmol/L (ref 3.5–5.1)
SODIUM: 138 mmol/L (ref 135–145)

## 2018-04-03 LAB — PROTIME-INR
INR: 0.95
PROTHROMBIN TIME: 12.6 s (ref 11.4–15.2)

## 2018-04-03 LAB — HEMOGLOBIN A1C
Hgb A1c MFr Bld: 6.9 % — ABNORMAL HIGH (ref 4.8–5.6)
MEAN PLASMA GLUCOSE: 151 mg/dL

## 2018-04-03 LAB — URINALYSIS, ROUTINE W REFLEX MICROSCOPIC
Bilirubin Urine: NEGATIVE
GLUCOSE, UA: NEGATIVE mg/dL
HGB URINE DIPSTICK: NEGATIVE
Ketones, ur: NEGATIVE mg/dL
Leukocytes, UA: NEGATIVE
Nitrite: NEGATIVE
Protein, ur: NEGATIVE mg/dL
SPECIFIC GRAVITY, URINE: 1.006 (ref 1.005–1.030)
pH: 7 (ref 5.0–8.0)

## 2018-04-03 LAB — CBC
HEMATOCRIT: 36.7 % — AB (ref 39.0–52.0)
HEMOGLOBIN: 12.4 g/dL — AB (ref 13.0–17.0)
MCH: 30.9 pg (ref 26.0–34.0)
MCHC: 33.8 g/dL (ref 30.0–36.0)
MCV: 91.5 fL (ref 78.0–100.0)
Platelets: 202 10*3/uL (ref 150–400)
RBC: 4.01 MIL/uL — AB (ref 4.22–5.81)
RDW: 12.4 % (ref 11.5–15.5)
WBC: 7.7 10*3/uL (ref 4.0–10.5)

## 2018-04-03 LAB — GLUCOSE, CAPILLARY
Glucose-Capillary: 213 mg/dL — ABNORMAL HIGH (ref 70–99)
Glucose-Capillary: 166 mg/dL — ABNORMAL HIGH (ref 70–99)
Glucose-Capillary: 124 mg/dL — ABNORMAL HIGH (ref 70–99)

## 2018-04-03 LAB — APTT: aPTT: 71 seconds — ABNORMAL HIGH (ref 24–36)

## 2018-04-03 LAB — HEPARIN LEVEL (UNFRACTIONATED): Heparin Unfractionated: 0.4 IU/mL (ref 0.30–0.70)

## 2018-04-03 MED ORDER — CHLORHEXIDINE GLUCONATE CLOTH 2 % EX PADS
6.0000 | MEDICATED_PAD | Freq: Once | CUTANEOUS | Status: AC
  Administered 2018-04-03: 6 via TOPICAL

## 2018-04-03 MED ORDER — TRANEXAMIC ACID (OHS) PUMP PRIME SOLUTION
2.0000 mg/kg | INTRAVENOUS | Status: DC
  Filled 2018-04-03: qty 1.32

## 2018-04-03 MED ORDER — SODIUM CHLORIDE 0.9 % IV SOLN
750.0000 mg | INTRAVENOUS | Status: DC
  Filled 2018-04-03: qty 750

## 2018-04-03 MED ORDER — PHENYLEPHRINE HCL 10 MG/ML IJ SOLN
30.0000 ug/min | INTRAMUSCULAR | Status: AC
  Administered 2018-04-04: 10 ug/min via INTRAVENOUS
  Filled 2018-04-03: qty 2

## 2018-04-03 MED ORDER — EPINEPHRINE PF 1 MG/ML IJ SOLN
0.0000 ug/min | INTRAVENOUS | Status: DC
  Filled 2018-04-03: qty 4

## 2018-04-03 MED ORDER — CHLORHEXIDINE GLUCONATE 0.12 % MT SOLN
15.0000 mL | Freq: Once | OROMUCOSAL | Status: AC
  Administered 2018-04-04: 15 mL via OROMUCOSAL
  Filled 2018-04-03: qty 15

## 2018-04-03 MED ORDER — TRANEXAMIC ACID (OHS) BOLUS VIA INFUSION
15.0000 mg/kg | INTRAVENOUS | Status: AC
  Administered 2018-04-04: 987 mg via INTRAVENOUS
  Filled 2018-04-03: qty 987

## 2018-04-03 MED ORDER — MILRINONE LACTATE IN DEXTROSE 20-5 MG/100ML-% IV SOLN
0.1250 ug/kg/min | INTRAVENOUS | Status: DC
Start: 2018-04-04 — End: 2018-04-04
  Filled 2018-04-03: qty 100

## 2018-04-03 MED ORDER — TEMAZEPAM 15 MG PO CAPS
15.0000 mg | ORAL_CAPSULE | Freq: Once | ORAL | Status: AC | PRN
  Administered 2018-04-03: 15 mg via ORAL
  Filled 2018-04-03: qty 1

## 2018-04-03 MED ORDER — INSULIN ASPART 100 UNIT/ML ~~LOC~~ SOLN
0.0000 [IU] | Freq: Three times a day (TID) | SUBCUTANEOUS | Status: DC
  Administered 2018-04-03: 2 [IU] via SUBCUTANEOUS

## 2018-04-03 MED ORDER — NITROGLYCERIN IN D5W 200-5 MCG/ML-% IV SOLN
2.0000 ug/min | INTRAVENOUS | Status: AC
  Administered 2018-04-04: 10 ug/min via INTRAVENOUS
  Filled 2018-04-03: qty 250

## 2018-04-03 MED ORDER — CHLORHEXIDINE GLUCONATE CLOTH 2 % EX PADS
6.0000 | MEDICATED_PAD | Freq: Once | CUTANEOUS | Status: AC
  Administered 2018-04-04: 6 via TOPICAL

## 2018-04-03 MED ORDER — PAPAVERINE HCL 30 MG/ML IJ SOLN
INTRAMUSCULAR | Status: AC
  Administered 2018-04-04: 09:00:00
  Filled 2018-04-03: qty 2.5

## 2018-04-03 MED ORDER — METOPROLOL TARTRATE 12.5 MG HALF TABLET
12.5000 mg | ORAL_TABLET | Freq: Once | ORAL | Status: AC
  Administered 2018-04-04: 12.5 mg via ORAL
  Filled 2018-04-03: qty 1

## 2018-04-03 MED ORDER — SODIUM CHLORIDE 0.9 % IV SOLN
1.5000 g | INTRAVENOUS | Status: AC
  Administered 2018-04-04: 1.5 g via INTRAVENOUS
  Filled 2018-04-03: qty 1.5

## 2018-04-03 MED ORDER — SODIUM CHLORIDE 0.9 % IV SOLN
1250.0000 mg | INTRAVENOUS | Status: AC
  Administered 2018-04-04: 1250 mg via INTRAVENOUS
  Filled 2018-04-03: qty 1250

## 2018-04-03 MED ORDER — POTASSIUM CHLORIDE 2 MEQ/ML IV SOLN
80.0000 meq | INTRAVENOUS | Status: DC
  Filled 2018-04-03: qty 40

## 2018-04-03 MED ORDER — MAGNESIUM SULFATE 50 % IJ SOLN
40.0000 meq | INTRAMUSCULAR | Status: DC
  Filled 2018-04-03: qty 9.85

## 2018-04-03 MED ORDER — INSULIN REGULAR HUMAN 100 UNIT/ML IJ SOLN
INTRAMUSCULAR | Status: AC
  Administered 2018-04-04: 2 [IU]/h via INTRAVENOUS
  Filled 2018-04-03: qty 1

## 2018-04-03 MED ORDER — DEXMEDETOMIDINE HCL IN NACL 400 MCG/100ML IV SOLN
0.1000 ug/kg/h | INTRAVENOUS | Status: AC
  Administered 2018-04-04: .4 ug/kg/h via INTRAVENOUS
  Filled 2018-04-03: qty 100

## 2018-04-03 MED ORDER — DOPAMINE-DEXTROSE 3.2-5 MG/ML-% IV SOLN
0.0000 ug/kg/min | INTRAVENOUS | Status: DC
  Filled 2018-04-03: qty 250

## 2018-04-03 MED ORDER — BISACODYL 5 MG PO TBEC
5.0000 mg | DELAYED_RELEASE_TABLET | Freq: Once | ORAL | Status: AC
  Administered 2018-04-03: 5 mg via ORAL
  Filled 2018-04-03: qty 1

## 2018-04-03 MED ORDER — SODIUM CHLORIDE 0.9 % IV SOLN
INTRAVENOUS | Status: DC
  Filled 2018-04-03: qty 30

## 2018-04-03 MED ORDER — TRANEXAMIC ACID 1000 MG/10ML IV SOLN
1.5000 mg/kg/h | INTRAVENOUS | Status: AC
  Administered 2018-04-04: 1.5 mg/kg/h via INTRAVENOUS
  Filled 2018-04-03: qty 25

## 2018-04-03 MED ORDER — ALBUTEROL SULFATE (2.5 MG/3ML) 0.083% IN NEBU
2.5000 mg | INHALATION_SOLUTION | Freq: Once | RESPIRATORY_TRACT | Status: AC
  Administered 2018-04-03: 2.5 mg via RESPIRATORY_TRACT

## 2018-04-03 MED ORDER — DIAZEPAM 5 MG PO TABS
5.0000 mg | ORAL_TABLET | Freq: Once | ORAL | Status: AC
  Administered 2018-04-04: 5 mg via ORAL
  Filled 2018-04-03: qty 1

## 2018-04-03 NOTE — Progress Notes (Signed)
Progress Note  Patient Name: Joseph Mcmillan Date of Encounter: 04/03/2018  Primary Cardiologist: Jenean Lindau, MD  Subjective   No chest pain or dypnea  Inpatient Medications    Scheduled Meds: . albuterol  2.5 mg Nebulization Once  . aspirin EC  81 mg Oral Daily  . feeding supplement (ENSURE ENLIVE)  237 mL Oral BID BM  . linaclotide  72 mcg Oral QAC breakfast  . metoprolol tartrate  12.5 mg Oral BID  . simvastatin  40 mg Oral QHS  . sodium chloride flush  3 mL Intravenous Q12H   Continuous Infusions: . sodium chloride    . heparin 850 Units/hr (03/31/18 1857)   PRN Meds: sodium chloride, acetaminophen, nitroGLYCERIN, ondansetron (ZOFRAN) IV, ramelteon, sodium chloride flush   Vital Signs    Vitals:   04/02/18 1225 04/02/18 1503 04/02/18 2057 04/03/18 0612  BP: 134/67 129/68 (!) 157/74 128/69  Pulse:  63 64 (!) 59  Resp:   18 15  Temp:  98 F (36.7 C) 99 F (37.2 C) 97.9 F (36.6 C)  TempSrc:  Oral Oral Oral  SpO2:  97% 95% 98%  Weight:    65.8 kg  Height:        Intake/Output Summary (Last 24 hours) at 04/03/2018 0750 Last data filed at 04/03/2018 0639 Gross per 24 hour  Intake 1083.33 ml  Output 1200 ml  Net -116.67 ml   Filed Weights   04/01/18 0554 04/02/18 0441 04/03/18 0612  Weight: 64.9 kg 66.2 kg 65.8 kg    Telemetry    Sinus - Personally Reviewed  ECG    ECG - Personally Reviewed NSR at 88;  Without ectopy.   Physical Exam   General: Well developed, well nourished, male appearing in no acute distress. Head: Normocephalic, atraumatic.  Neck: Supple without bruits, no JVD Lungs:  Resp regular and unlabored, CTA. Heart: RRR, S1, S2, no S3, S4, or murmur; no rub. Abdomen: Soft, non-tender, non-distended with normoactive bowel sounds. No hepatomegaly. No rebound/guarding. No obvious abdominal masses. Extremities: No clubbing, cyanosis, edema. Distal pedal pulses are 2+ bilaterally. Neuro: Alert and oriented X 3. Moves all extremities  spontaneously. Psych: Normal affect.  Labs    Chemistry Recent Labs  Lab 03/28/18 1222 04/03/18 0314  NA 139 138  K 4.4 3.9  CL 98 103  CO2 25 26  GLUCOSE 158* 132*  BUN 14 18  CREATININE 1.00 1.03  CALCIUM 9.4 9.1  GFRNONAA 70 >60  GFRAA 81 >60  ANIONGAP  --  9     Hematology Recent Labs  Lab 04/01/18 0244 04/02/18 0427 04/03/18 0314  WBC 6.8 7.5 7.7  RBC 3.95* 4.11* 4.01*  HGB 12.3* 12.6* 12.4*  HCT 35.6* 37.6* 36.7*  MCV 90.1 91.5 91.5  MCH 31.1 30.7 30.9  MCHC 34.6 33.5 33.8  RDW 12.2 12.5 12.4  PLT 212 195 202    Cardiac EnzymesNo results for input(s): TROPONINI in the last 168 hours. No results for input(s): TROPIPOC in the last 168 hours.   BNPNo results for input(s): BNP, PROBNP in the last 168 hours.   DDimer No results for input(s): DDIMER in the last 168 hours.    Radiology    No results found.  Cardiac Studies   Cath: 03/30/18   Mid Cx lesion is 90% stenosed.  Ost Cx to Prox Cx lesion is 75% stenosed.  Ost RCA to Prox RCA lesion is 80% stenosed.  Mid LAD lesion is 95% stenosed.  Prox LAD  to Mid LAD lesion is 75% stenosed.  Ost 1st Diag to 1st Diag lesion is 75% stenosed.  Ost RPDA to RPDA lesion is 75% stenosed.  Ost LAD to Prox LAD lesion is 25% stenosed.  The left ventricular systolic function is normal.  LV end diastolic pressure is normal. LVEDP 3 mm Hg.  The left ventricular ejection fraction is 55-65% by visual estimate.  There is no aortic valve stenosis.   Severe three vessel CAD.  Hold Plavix.  Due to his significant accelerating angina and severe disease, will admit to hospital and start IV heparin after sheath pull.    CVTS consult.   Continue aspirin.    TTE: 03/31/18  Study Conclusions  - Left ventricle: The cavity size was normal. There was mild focal   basal hypertrophy of the septum. Systolic function was normal.   The estimated ejection fraction was in the range of 50% to 55%.   Wall motion was  normal; there were no regional wall motion   abnormalities. The study is indeterminate for the evaluation of   LV diastolic function. - Aortic valve: There was no significant regurgitation. - Mitral valve: There was no significant regurgitation. - Right ventricle: Systolic function was normal. - Atrial septum: No defect or patent foramen ovale was identified. - Tricuspid valve: There was trivial regurgitation. - Pulmonic valve: There was trivial regurgitation.  Impressions:  - Normal LV systolic function, no significant valvular disease.  Patient Profile     84 y.o. male a PMH significant for atherosclerotic vascular disease and is s/p carotid endarterectomy, presenting to clinic with chest tightness concerning for angina. Heart cath with 3 vessel disease, TCTS consulted yesterday.  Assessment & Plan    1. CAD: Underwent cath noted above with severe 3 vessel disease. Seen by TCTS with plans for surgery tomorrow. Remains on IV heparin, ASA, statin and BB.  -- Hgb A1c pending  2. HTN: stable with current therapy  3. HL: LDL 84, remains on Simvastatin 40mg   4. Carotid artery disease: on ASA, and statin   Signed, Reino Bellis, NP  04/03/2018, 7:50 AM  Pager # [phone]   For questions or updates, please contact [employer] Please consult [email or web address] for contact info under Cardiology/STEMI.   Patient seen and examined. Agree with assessment and plan. No recurrent chest pain or dyspnea. No JVD, rales, or wheezing. RRR no ectopy. No edema.  Severe MVCAD, plavix washout since 8/8, plan for CABG tomorrow.  Would change to high potency statin post CABG with LDL target < 70.    Troy Sine, MD, [facility] 04/03/2018 4:23 PM

## 2018-04-03 NOTE — Progress Notes (Signed)
ANTICOAGULATION CONSULT NOTE - Follow Up Consult  Pharmacy Consult for Heparin Indication: CAD, awaiting CABG  Allergies  Allergen Reactions  . Codeine Nausea And Vomiting    Patient Measurements: Height: 5\' 9"  (175.3 cm) Weight: 145 lb 1.6 oz (65.8 kg) IBW/kg (Calculated) : 70.7 Heparin Dosing Weight: 64.9 kg  Vital Signs: Temp: 97.9 F (36.6 C) (08/12 0612) Temp Source: Oral (08/12 0612) BP: 128/69 (08/12 0612) Pulse Rate: 59 (08/12 0612)  Labs: Recent Labs    04/01/18 0244 04/01/18 1044 04/02/18 0427 04/03/18 0314  HGB 12.3*  --  12.6* 12.4*  HCT 35.6*  --  37.6* 36.7*  PLT 212  --  195 202  HEPARINUNFRC 0.49 0.47 0.44 0.40  CREATININE  --   --   --  1.03    Estimated Creatinine Clearance: 52.3 mL/min (by C-G formula based on SCr of 1.03 mg/dL).   Medications:  Scheduled:  . albuterol  2.5 mg Nebulization Once  . aspirin EC  81 mg Oral Daily  . feeding supplement (ENSURE ENLIVE)  237 mL Oral BID BM  . linaclotide  72 mcg Oral QAC breakfast  . metoprolol tartrate  12.5 mg Oral BID  . simvastatin  40 mg Oral QHS  . sodium chloride flush  3 mL Intravenous Q12H   Infusions:  . sodium chloride    . heparin 850 Units/hr (03/31/18 1857)   Assessment: 84 yo M continues on heparin for CAD.  Pt s/p cath showing mutli vessel disease awaiting CABG- plan for 8/13.    Heparin level remains therapeutic at 0.4, on 850 units/hr. Hgb 12.4, plt 202. No s/sx of bleeding.   Goal of Therapy:  Heparin level 0.3-0.7 units/ml Monitor platelets by anticoagulation protocol: Yes   Plan:  Continue heparin at 850 units/hr Daily heparin level and CBC Monitor for s/sx of bleeding   Doylene Canard, PharmD Clinical Pharmacist  Pager: [phone] Phone: [phone] Clinical phone for 04/03/2018 from 8:30-4:00 is [phone].  **Pharmacist phone directory can now be found on [email or web address] (PW [email or web address]).  Listed under [place].  04/03/2018 7:50 AM

## 2018-04-03 NOTE — Progress Notes (Signed)
Inpatient Diabetes Program Recommendations  AACE/ADA: New Consensus Statement on Inpatient Glycemic Control (2015)  Target Ranges:  Prepandial:   less than 140 mg/dL      Peak postprandial:   less than 180 mg/dL (1-2 hours)      Critically ill patients:  140 - 180 mg/dL    Review of Glycemic Control  Diabetes history: None noted in chart  Inpatient Diabetes Program Recommendations:    Current A1c 6.9% meeting criteria for new DM diagnosis. Please inform patient if desired.  Thanks,  Tama Headings RN, MSN, BC-ADM Inpatient Diabetes Coordinator Team Pager [phone] (8a-5p)

## 2018-04-03 NOTE — Progress Notes (Signed)
CARDIAC REHAB PHASE I   Checked on pt to see if he had any questions re pre-op education. Pt states he is anxious for surgery. Talked with pt a long time to help ease his fears. Will continue to follow.  [id number] Rufina Falco, RN BSN 04/03/2018 12:12 PM

## 2018-04-04 ENCOUNTER — Inpatient Hospital Stay (HOSPITAL_COMMUNITY): Payer: Medicare Other

## 2018-04-04 ENCOUNTER — Inpatient Hospital Stay (HOSPITAL_COMMUNITY): Payer: Medicare Other | Admitting: Anesthesiology

## 2018-04-04 ENCOUNTER — Encounter (HOSPITAL_COMMUNITY)
Admission: AD | Disposition: A | Discharge status: Home / Self Care | Payer: Self-pay | Source: Ambulatory Visit | Attending: Surgery

## 2018-04-04 DIAGNOSIS — Z951 Presence of aortocoronary bypass graft: Secondary | ICD-10-CM

## 2018-04-04 HISTORY — DX: Presence of aortocoronary bypass graft: Z95.1

## 2018-04-04 HISTORY — PX: TEE WITHOUT CARDIOVERSION: SHX5443

## 2018-04-04 HISTORY — PX: CORONARY ARTERY BYPASS GRAFT: SHX141

## 2018-04-04 HISTORY — PX: ENDOVEIN HARVEST OF GREATER SAPHENOUS VEIN: SHX5059

## 2018-04-04 LAB — GLUCOSE, CAPILLARY
GLUCOSE-CAPILLARY: 118 mg/dL — AB (ref 70–99)
GLUCOSE-CAPILLARY: 110 mg/dL — AB (ref 70–99)
Glucose-Capillary: 126 mg/dL — ABNORMAL HIGH (ref 70–99)
Glucose-Capillary: 120 mg/dL — ABNORMAL HIGH (ref 70–99)
Glucose-Capillary: 123 mg/dL — ABNORMAL HIGH (ref 70–99)
Glucose-Capillary: 116 mg/dL — ABNORMAL HIGH (ref 70–99)
Glucose-Capillary: 120 mg/dL — ABNORMAL HIGH (ref 70–99)
Glucose-Capillary: 147 mg/dL — ABNORMAL HIGH (ref 70–99)

## 2018-04-04 LAB — POCT I-STAT, CHEM 8
BUN: 18 mg/dL (ref 8–23)
BUN: 18 mg/dL (ref 8–23)
BUN: 14 mg/dL (ref 8–23)
BUN: 17 mg/dL (ref 8–23)
BUN: 17 mg/dL (ref 8–23)
BUN: 17 mg/dL (ref 8–23)
BUN: 19 mg/dL (ref 8–23)
CALCIUM ION: 1.18 mmol/L (ref 1.15–1.40)
CALCIUM ION: 0.84 mmol/L — AB (ref 1.15–1.40)
CALCIUM ION: 1.24 mmol/L (ref 1.15–1.40)
CHLORIDE: 99 mmol/L (ref 98–111)
CHLORIDE: 97 mmol/L — AB (ref 98–111)
CHLORIDE: 97 mmol/L — AB (ref 98–111)
CREATININE: 0.8 mg/dL (ref 0.61–1.24)
CREATININE: 0.6 mg/dL — AB (ref 0.61–1.24)
CREATININE: 0.7 mg/dL (ref 0.61–1.24)
Calcium, Ion: 1.24 mmol/L (ref 1.15–1.40)
Calcium, Ion: 1.06 mmol/L — ABNORMAL LOW (ref 1.15–1.40)
Calcium, Ion: 1.24 mmol/L (ref 1.15–1.40)
Calcium, Ion: 1.2 mmol/L (ref 1.15–1.40)
Chloride: 101 mmol/L (ref 98–111)
Chloride: 101 mmol/L (ref 98–111)
Chloride: 96 mmol/L — ABNORMAL LOW (ref 98–111)
Chloride: 104 mmol/L (ref 98–111)
Creatinine, Ser: 0.8 mg/dL (ref 0.61–1.24)
Creatinine, Ser: 0.5 mg/dL — ABNORMAL LOW (ref 0.61–1.24)
Creatinine, Ser: 0.8 mg/dL (ref 0.61–1.24)
Creatinine, Ser: 0.9 mg/dL (ref 0.61–1.24)
GLUCOSE: 158 mg/dL — AB (ref 70–99)
GLUCOSE: 112 mg/dL — AB (ref 70–99)
GLUCOSE: 107 mg/dL — AB (ref 70–99)
GLUCOSE: 137 mg/dL — AB (ref 70–99)
GLUCOSE: 123 mg/dL — AB (ref 70–99)
Glucose, Bld: 84 mg/dL (ref 70–99)
Glucose, Bld: 156 mg/dL — ABNORMAL HIGH (ref 70–99)
HCT: 33 % — ABNORMAL LOW (ref 39.0–52.0)
HCT: 30 % — ABNORMAL LOW (ref 39.0–52.0)
HCT: 27 % — ABNORMAL LOW (ref 39.0–52.0)
HEMATOCRIT: 30 % — AB (ref 39.0–52.0)
HEMATOCRIT: 23 % — AB (ref 39.0–52.0)
HEMATOCRIT: 21 % — AB (ref 39.0–52.0)
HEMATOCRIT: 23 % — AB (ref 39.0–52.0)
HEMOGLOBIN: 10.2 g/dL — AB (ref 13.0–17.0)
HEMOGLOBIN: 10.2 g/dL — AB (ref 13.0–17.0)
HEMOGLOBIN: 7.8 g/dL — AB (ref 13.0–17.0)
HEMOGLOBIN: 7.8 g/dL — AB (ref 13.0–17.0)
HEMOGLOBIN: 9.2 g/dL — AB (ref 13.0–17.0)
Hemoglobin: 11.2 g/dL — ABNORMAL LOW (ref 13.0–17.0)
Hemoglobin: 7.1 g/dL — ABNORMAL LOW (ref 13.0–17.0)
POTASSIUM: 4 mmol/L (ref 3.5–5.1)
POTASSIUM: 5.2 mmol/L — AB (ref 3.5–5.1)
POTASSIUM: 4.2 mmol/L (ref 3.5–5.1)
POTASSIUM: 5.1 mmol/L (ref 3.5–5.1)
Potassium: 4 mmol/L (ref 3.5–5.1)
Potassium: 4 mmol/L (ref 3.5–5.1)
Potassium: 4.1 mmol/L (ref 3.5–5.1)
SODIUM: 137 mmol/L (ref 135–145)
SODIUM: 137 mmol/L (ref 135–145)
SODIUM: 138 mmol/L (ref 135–145)
SODIUM: 130 mmol/L — AB (ref 135–145)
Sodium: 136 mmol/L (ref 135–145)
Sodium: 134 mmol/L — ABNORMAL LOW (ref 135–145)
Sodium: 138 mmol/L (ref 135–145)
TCO2: 29 mmol/L (ref 22–32)
TCO2: 29 mmol/L (ref 22–32)
TCO2: 26 mmol/L (ref 22–32)
TCO2: 27 mmol/L (ref 22–32)
TCO2: 26 mmol/L (ref 22–32)
TCO2: 32 mmol/L (ref 22–32)
TCO2: 26 mmol/L (ref 22–32)

## 2018-04-04 LAB — CBC
HCT: 37.4 % — ABNORMAL LOW (ref 39.0–52.0)
HEMATOCRIT: 28.4 % — AB (ref 39.0–52.0)
HEMATOCRIT: 26.3 % — AB (ref 39.0–52.0)
HEMATOCRIT: 29.8 % — AB (ref 39.0–52.0)
HEMOGLOBIN: 9.5 g/dL — AB (ref 13.0–17.0)
HEMOGLOBIN: 8.9 g/dL — AB (ref 13.0–17.0)
HEMOGLOBIN: 9.9 g/dL — AB (ref 13.0–17.0)
Hemoglobin: 12.6 g/dL — ABNORMAL LOW (ref 13.0–17.0)
MCH: 30.9 pg (ref 26.0–34.0)
MCH: 30.6 pg (ref 26.0–34.0)
MCH: 31.1 pg (ref 26.0–34.0)
MCH: 30.7 pg (ref 26.0–34.0)
MCHC: 33.7 g/dL (ref 30.0–36.0)
MCHC: 33.5 g/dL (ref 30.0–36.0)
MCHC: 33.8 g/dL (ref 30.0–36.0)
MCHC: 33.2 g/dL (ref 30.0–36.0)
MCV: 91.7 fL (ref 78.0–100.0)
MCV: 91.6 fL (ref 78.0–100.0)
MCV: 92 fL (ref 78.0–100.0)
MCV: 92.3 fL (ref 78.0–100.0)
PLATELETS: 201 10*3/uL (ref 150–400)
PLATELETS: 153 10*3/uL (ref 150–400)
Platelets: 158 10*3/uL (ref 150–400)
Platelets: 155 10*3/uL (ref 150–400)
RBC: 4.08 MIL/uL — ABNORMAL LOW (ref 4.22–5.81)
RBC: 3.1 MIL/uL — AB (ref 4.22–5.81)
RBC: 2.86 MIL/uL — AB (ref 4.22–5.81)
RBC: 3.23 MIL/uL — ABNORMAL LOW (ref 4.22–5.81)
RDW: 12.5 % (ref 11.5–15.5)
RDW: 12.6 % (ref 11.5–15.5)
RDW: 12.5 % (ref 11.5–15.5)
RDW: 12.5 % (ref 11.5–15.5)
WBC: 7.6 10*3/uL (ref 4.0–10.5)
WBC: 12.4 10*3/uL — ABNORMAL HIGH (ref 4.0–10.5)
WBC: 11.2 10*3/uL — AB (ref 4.0–10.5)
WBC: 10.5 10*3/uL (ref 4.0–10.5)

## 2018-04-04 LAB — BASIC METABOLIC PANEL
Anion gap: 9 (ref 5–15)
BUN: 16 mg/dL (ref 8–23)
CALCIUM: 9.3 mg/dL (ref 8.9–10.3)
CO2: 30 mmol/L (ref 22–32)
CREATININE: 1.06 mg/dL (ref 0.61–1.24)
Chloride: 99 mmol/L (ref 98–111)
GFR calc non Af Amer: >60 mL/min (ref >60)
Glucose, Bld: 134 mg/dL — ABNORMAL HIGH (ref 70–99)
Potassium: 4.2 mmol/L (ref 3.5–5.1)
SODIUM: 138 mmol/L (ref 135–145)

## 2018-04-04 LAB — POCT I-STAT 3, ART BLOOD GAS (G3+)
ACID-BASE EXCESS: 7 mmol/L — AB (ref 0.0–2.0)
Acid-Base Excess: 3 mmol/L — ABNORMAL HIGH (ref 0.0–2.0)
Acid-base deficit: 1 mmol/L (ref 0.0–2.0)
BICARBONATE: 31.2 mmol/L — AB (ref 20.0–28.0)
Bicarbonate: 28.1 mmol/L — ABNORMAL HIGH (ref 20.0–28.0)
Bicarbonate: 25.6 mmol/L (ref 20.0–28.0)
Bicarbonate: 24 mmol/L (ref 20.0–28.0)
O2 SAT: 100 %
O2 SAT: 99 %
O2 Saturation: 100 %
O2 Saturation: 100 %
PCO2 ART: 43.1 mmHg (ref 32.0–48.0)
PCO2 ART: 36.6 mmHg (ref 32.0–48.0)
PH ART: 7.382 (ref 7.350–7.450)
PH ART: 7.503 — AB (ref 7.350–7.450)
PO2 ART: 390 mmHg — AB (ref 83.0–108.0)
PO2 ART: 356 mmHg — AB (ref 83.0–108.0)
Patient temperature: 35.1
TCO2: 29 mmol/L (ref 22–32)
TCO2: 27 mmol/L (ref 22–32)
TCO2: 32 mmol/L (ref 22–32)
TCO2: 25 mmol/L (ref 22–32)
pCO2 arterial: 44.4 mmHg (ref 32.0–48.0)
pCO2 arterial: 39.8 mmHg (ref 32.0–48.0)
pH, Arterial: 7.409 (ref 7.350–7.450)
pH, Arterial: 7.418 (ref 7.350–7.450)
pO2, Arterial: 470 mmHg — ABNORMAL HIGH (ref 83.0–108.0)
pO2, Arterial: 114 mmHg — ABNORMAL HIGH (ref 83.0–108.0)

## 2018-04-04 LAB — POCT I-STAT 4, (NA,K, GLUC, HGB,HCT)
Glucose, Bld: 125 mg/dL — ABNORMAL HIGH (ref 70–99)
Glucose, Bld: 119 mg/dL — ABNORMAL HIGH (ref 70–99)
HCT: 24 % — ABNORMAL LOW (ref 39.0–52.0)
HEMATOCRIT: 23 % — AB (ref 39.0–52.0)
Hemoglobin: 8.2 g/dL — ABNORMAL LOW (ref 13.0–17.0)
Hemoglobin: 7.8 g/dL — ABNORMAL LOW (ref 13.0–17.0)
Potassium: 3.6 mmol/L (ref 3.5–5.1)
Potassium: 3.8 mmol/L (ref 3.5–5.1)
Sodium: 138 mmol/L (ref 135–145)
Sodium: 139 mmol/L (ref 135–145)

## 2018-04-04 LAB — HEMOGLOBIN AND HEMATOCRIT, BLOOD
HCT: 24.4 % — ABNORMAL LOW (ref 39.0–52.0)
Hemoglobin: 8.2 g/dL — ABNORMAL LOW (ref 13.0–17.0)

## 2018-04-04 LAB — HEPARIN LEVEL (UNFRACTIONATED): Heparin Unfractionated: 0.34 IU/mL (ref 0.30–0.70)

## 2018-04-04 LAB — CREATININE, SERUM
Creatinine, Ser: 0.93 mg/dL (ref 0.61–1.24)
GFR calc non Af Amer: >60 mL/min (ref >60)

## 2018-04-04 LAB — POCT ACTIVATED CLOTTING TIME: Activated Clotting Time: 132 seconds

## 2018-04-04 LAB — PROTIME-INR
INR: 1.4
Prothrombin Time: 17 seconds — ABNORMAL HIGH (ref 11.4–15.2)

## 2018-04-04 LAB — SURGICAL PCR SCREEN
MRSA, PCR: NEGATIVE
STAPHYLOCOCCUS AUREUS: NEGATIVE

## 2018-04-04 LAB — PREPARE RBC (CROSSMATCH)

## 2018-04-04 LAB — APTT: aPTT: 32 seconds (ref 24–36)

## 2018-04-04 LAB — PLATELET COUNT: Platelets: 158 10*3/uL (ref 150–400)

## 2018-04-04 LAB — MAGNESIUM: Magnesium: 3.4 mg/dL — ABNORMAL HIGH (ref 1.7–2.4)

## 2018-04-04 SURGERY — CORONARY ARTERY BYPASS GRAFTING (CABG)
Anesthesia: General | Site: Leg Upper | Laterality: Right

## 2018-04-04 MED ORDER — SODIUM CHLORIDE 0.9 % IV SOLN
INTRAVENOUS | Status: DC
  Administered 2018-04-05: 1.8 [IU]/h via INTRAVENOUS
  Filled 2018-04-04: qty 1

## 2018-04-04 MED ORDER — CHLORHEXIDINE GLUCONATE 0.12% ORAL RINSE (MEDLINE KIT)
15.0000 mL | Freq: Two times a day (BID) | OROMUCOSAL | Status: DC
  Administered 2018-04-04 – 2018-04-06 (×5): 15 mL via OROMUCOSAL

## 2018-04-04 MED ORDER — VANCOMYCIN HCL IN DEXTROSE 1-5 GM/200ML-% IV SOLN
1000.0000 mg | Freq: Once | INTRAVENOUS | Status: AC
  Administered 2018-04-04: 1000 mg via INTRAVENOUS
  Filled 2018-04-04: qty 200

## 2018-04-04 MED ORDER — HEPARIN SODIUM (PORCINE) 1000 UNIT/ML IJ SOLN
INTRAMUSCULAR | Status: DC | PRN
  Administered 2018-04-04: 23000 [IU] via INTRAVENOUS

## 2018-04-04 MED ORDER — ACETAMINOPHEN 160 MG/5ML PO SOLN
650.0000 mg | Freq: Once | ORAL | Status: AC
  Administered 2018-04-04: 650 mg

## 2018-04-04 MED ORDER — SODIUM CHLORIDE 0.9 % IV SOLN: 250.0000 mL | INTRAVENOUS | Status: DC

## 2018-04-04 MED ORDER — MAGNESIUM SULFATE 4 GM/100ML IV SOLN
4.0000 g | Freq: Once | INTRAVENOUS | Status: AC
  Administered 2018-04-04: 4 g via INTRAVENOUS
  Filled 2018-04-04: qty 100

## 2018-04-04 MED ORDER — SODIUM CHLORIDE 0.9% FLUSH
3.0000 mL | Freq: Two times a day (BID) | INTRAVENOUS | Status: DC
  Administered 2018-04-05 – 2018-04-13 (×11): 3 mL via INTRAVENOUS

## 2018-04-04 MED ORDER — PHENYLEPHRINE 40 MCG/ML (10ML) SYRINGE FOR IV PUSH (FOR BLOOD PRESSURE SUPPORT)
PREFILLED_SYRINGE | INTRAVENOUS | Status: DC | PRN
  Administered 2018-04-04 (×2): 40 ug via INTRAVENOUS
  Administered 2018-04-04: 80 ug via INTRAVENOUS
  Administered 2018-04-04: 40 ug via INTRAVENOUS
  Administered 2018-04-04: 8 ug via INTRAVENOUS
  Administered 2018-04-04 (×2): 40 ug via INTRAVENOUS
  Administered 2018-04-04: 80 ug via INTRAVENOUS
  Administered 2018-04-04: 40 ug via INTRAVENOUS
  Administered 2018-04-04: 80 ug via INTRAVENOUS

## 2018-04-04 MED ORDER — LACTATED RINGERS IV SOLN: INTRAVENOUS | Status: DC

## 2018-04-04 MED ORDER — DIPHENHYDRAMINE HCL 50 MG/ML IJ SOLN
INTRAMUSCULAR | Status: DC | PRN
  Administered 2018-04-04: 50 mg via INTRAVENOUS

## 2018-04-04 MED ORDER — SODIUM CHLORIDE 0.9% IV SOLUTION
Freq: Once | INTRAVENOUS | Status: AC
  Administered 2018-04-04: 10 mL/h via INTRAVENOUS

## 2018-04-04 MED ORDER — PHENYLEPHRINE HCL 10 MG/ML IJ SOLN
INTRAMUSCULAR | Status: DC | PRN
  Administered 2018-04-04: 20 ug/min via INTRAVENOUS

## 2018-04-04 MED ORDER — ASPIRIN EC 325 MG PO TBEC
325.0000 mg | DELAYED_RELEASE_TABLET | Freq: Every day | ORAL | Status: DC
  Administered 2018-04-05: 325 mg via ORAL
  Filled 2018-04-04: qty 1

## 2018-04-04 MED ORDER — 0.9 % SODIUM CHLORIDE (POUR BTL) OPTIME
TOPICAL | Status: DC | PRN
  Administered 2018-04-04: 5000 mL

## 2018-04-04 MED ORDER — ALBUMIN HUMAN 5 % IV SOLN
250.0000 mL | INTRAVENOUS | Status: AC | PRN
  Administered 2018-04-04 (×4): 250 mL via INTRAVENOUS
  Filled 2018-04-04 (×2): qty 250

## 2018-04-04 MED ORDER — SODIUM CHLORIDE 0.9 % IV SOLN
1.5000 g | Freq: Two times a day (BID) | INTRAVENOUS | Status: AC
  Administered 2018-04-04 – 2018-04-06 (×4): 1.5 g via INTRAVENOUS
  Filled 2018-04-04 (×4): qty 1.5

## 2018-04-04 MED ORDER — HEMOSTATIC AGENTS (NO CHARGE) OPTIME
TOPICAL | Status: DC | PRN
  Administered 2018-04-04: 3 via TOPICAL

## 2018-04-04 MED ORDER — FENTANYL CITRATE (PF) 250 MCG/5ML IJ SOLN
INTRAMUSCULAR | Status: AC
  Filled 2018-04-04: qty 5

## 2018-04-04 MED ORDER — THROMBIN 5000 UNITS EX SOLR
CUTANEOUS | Status: DC | PRN
  Administered 2018-04-04: 20000 [IU] via TOPICAL

## 2018-04-04 MED ORDER — ROCURONIUM BROMIDE 100 MG/10ML IV SOLN: INTRAVENOUS | Status: DC | PRN

## 2018-04-04 MED ORDER — FAMOTIDINE IN NACL 20-0.9 MG/50ML-% IV SOLN
20.0000 mg | Freq: Two times a day (BID) | INTRAVENOUS | Status: AC
  Administered 2018-04-04 – 2018-04-05 (×2): 20 mg via INTRAVENOUS
  Filled 2018-04-04: qty 50

## 2018-04-04 MED ORDER — METOPROLOL TARTRATE 25 MG/10 ML ORAL SUSPENSION: 12.5000 mg | Freq: Two times a day (BID) | ORAL | Status: DC

## 2018-04-04 MED ORDER — LACTATED RINGERS IV SOLN
INTRAVENOUS | Status: DC | PRN
  Administered 2018-04-04: 07:00:00 via INTRAVENOUS

## 2018-04-04 MED ORDER — PROPOFOL 10 MG/ML IV BOLUS
INTRAVENOUS | Status: DC | PRN
  Administered 2018-04-04: 50 mg via INTRAVENOUS
  Administered 2018-04-04: 30 mg via INTRAVENOUS
  Administered 2018-04-04: 20 mg via INTRAVENOUS
  Administered 2018-04-04: 30 mg via INTRAVENOUS
  Administered 2018-04-04: 50 mg via INTRAVENOUS

## 2018-04-04 MED ORDER — METOCLOPRAMIDE HCL 5 MG/ML IJ SOLN
10.0000 mg | Freq: Four times a day (QID) | INTRAMUSCULAR | Status: AC
  Administered 2018-04-04 – 2018-04-05 (×4): 10 mg via INTRAVENOUS
  Filled 2018-04-04 (×3): qty 2

## 2018-04-04 MED ORDER — PHENYLEPHRINE HCL 10 MG/ML IJ SOLN: INTRAMUSCULAR | Status: DC | PRN

## 2018-04-04 MED ORDER — FENTANYL CITRATE (PF) 100 MCG/2ML IJ SOLN
50.0000 ug | INTRAMUSCULAR | Status: DC | PRN
  Administered 2018-04-05 – 2018-04-09 (×4): 50 ug via INTRAVENOUS
  Filled 2018-04-04 (×5): qty 2

## 2018-04-04 MED ORDER — THROMBIN 5000 UNITS EX SOLR
CUTANEOUS | Status: AC
  Filled 2018-04-04: qty 15000

## 2018-04-04 MED ORDER — ACETAMINOPHEN 160 MG/5ML PO SOLN
1000.0000 mg | Freq: Four times a day (QID) | ORAL | Status: DC
  Administered 2018-04-05 (×2): 1000 mg
  Filled 2018-04-04: qty 40.6

## 2018-04-04 MED ORDER — ONDANSETRON HCL 4 MG/2ML IJ SOLN
4.0000 mg | Freq: Four times a day (QID) | INTRAMUSCULAR | Status: DC | PRN
  Administered 2018-04-05 – 2018-04-12 (×3): 4 mg via INTRAVENOUS
  Filled 2018-04-04 (×4): qty 2

## 2018-04-04 MED ORDER — FENTANYL CITRATE (PF) 250 MCG/5ML IJ SOLN
INTRAMUSCULAR | Status: AC
Start: 2018-04-04 — End: ?
  Filled 2018-04-04: qty 5

## 2018-04-04 MED ORDER — DIPHENHYDRAMINE HCL 50 MG/ML IJ SOLN
INTRAMUSCULAR | Status: AC
  Filled 2018-04-04: qty 1

## 2018-04-04 MED ORDER — LACTATED RINGERS IV SOLN
INTRAVENOUS | Status: DC | PRN
  Administered 2018-04-04 (×2): via INTRAVENOUS

## 2018-04-04 MED ORDER — NITROGLYCERIN 0.2 MG/ML ON CALL CATH LAB
INTRAVENOUS | Status: DC | PRN
  Administered 2018-04-04: 40 ug via INTRAVENOUS

## 2018-04-04 MED ORDER — FENTANYL CITRATE (PF) 250 MCG/5ML IJ SOLN
INTRAMUSCULAR | Status: AC
  Filled 2018-04-04: qty 20

## 2018-04-04 MED ORDER — LACTATED RINGERS IV SOLN
500.0000 mL | Freq: Once | INTRAVENOUS | Status: AC | PRN
  Administered 2018-04-05: 500 mL via INTRAVENOUS

## 2018-04-04 MED ORDER — ORAL CARE MOUTH RINSE
15.0000 mL | OROMUCOSAL | Status: DC
  Administered 2018-04-04 – 2018-04-05 (×9): 15 mL via OROMUCOSAL

## 2018-04-04 MED ORDER — CHLORHEXIDINE GLUCONATE 0.12 % MT SOLN
15.0000 mL | OROMUCOSAL | Status: AC
Start: 2018-04-04 — End: 2018-04-04
  Administered 2018-04-04: 15 mL via OROMUCOSAL

## 2018-04-04 MED ORDER — PHENYLEPHRINE HCL-NACL 20-0.9 MG/250ML-% IV SOLN
0.0000 ug/min | INTRAVENOUS | Status: DC
  Filled 2018-04-04: qty 250

## 2018-04-04 MED ORDER — ONDANSETRON HCL 4 MG/2ML IJ SOLN
INTRAMUSCULAR | Status: DC | PRN
  Administered 2018-04-04: 4 mg via INTRAVENOUS

## 2018-04-04 MED ORDER — SODIUM CHLORIDE 0.9% FLUSH: 3.0000 mL | INTRAVENOUS | Status: DC | PRN

## 2018-04-04 MED ORDER — ROCURONIUM BROMIDE 10 MG/ML (PF) SYRINGE
PREFILLED_SYRINGE | INTRAVENOUS | Status: DC | PRN
  Administered 2018-04-04: 50 mg via INTRAVENOUS
  Administered 2018-04-04: 80 mg via INTRAVENOUS
  Administered 2018-04-04: 20 mg via INTRAVENOUS

## 2018-04-04 MED ORDER — SODIUM CHLORIDE 0.9 % IV SOLN
INTRAVENOUS | Status: DC | PRN
  Administered 2018-04-04: 13:00:00 via INTRAVENOUS

## 2018-04-04 MED ORDER — ASPIRIN 81 MG PO CHEW: 324.0000 mg | CHEWABLE_TABLET | Freq: Every day | ORAL | Status: DC

## 2018-04-04 MED ORDER — POTASSIUM CHLORIDE 10 MEQ/50ML IV SOLN
10.0000 meq | INTRAVENOUS | Status: AC
  Administered 2018-04-04 (×3): 10 meq via INTRAVENOUS

## 2018-04-04 MED ORDER — PHENYLEPHRINE 40 MCG/ML (10ML) SYRINGE FOR IV PUSH (FOR BLOOD PRESSURE SUPPORT)
PREFILLED_SYRINGE | INTRAVENOUS | Status: AC
  Filled 2018-04-04: qty 10

## 2018-04-04 MED ORDER — MIDAZOLAM HCL 10 MG/2ML IJ SOLN
INTRAMUSCULAR | Status: AC
  Filled 2018-04-04: qty 2

## 2018-04-04 MED ORDER — SODIUM CHLORIDE 0.45 % IV SOLN
INTRAVENOUS | Status: DC | PRN
  Administered 2018-04-04: 20 mL/h via INTRAVENOUS

## 2018-04-04 MED ORDER — MIDAZOLAM HCL 5 MG/5ML IJ SOLN
INTRAMUSCULAR | Status: DC | PRN
  Administered 2018-04-04 (×5): 2 mg via INTRAVENOUS

## 2018-04-04 MED ORDER — CALCIUM CHLORIDE 10 % IV SOLN
INTRAVENOUS | Status: DC | PRN
  Administered 2018-04-04 (×2): 200 mg via INTRAVENOUS
  Administered 2018-04-04: 100 mg via INTRAVENOUS

## 2018-04-04 MED ORDER — ACETAMINOPHEN 500 MG PO TABS
1000.0000 mg | ORAL_TABLET | Freq: Four times a day (QID) | ORAL | Status: AC
  Administered 2018-04-05 – 2018-04-09 (×17): 1000 mg via ORAL
  Filled 2018-04-04 (×19): qty 2

## 2018-04-04 MED ORDER — PANTOPRAZOLE SODIUM 40 MG PO TBEC
40.0000 mg | DELAYED_RELEASE_TABLET | Freq: Every day | ORAL | Status: DC
Start: 2018-04-06 — End: 2018-04-13
  Administered 2018-04-06 – 2018-04-13 (×8): 40 mg via ORAL
  Filled 2018-04-04 (×8): qty 1

## 2018-04-04 MED ORDER — NITROGLYCERIN IN D5W 200-5 MCG/ML-% IV SOLN
INTRAVENOUS | Status: AC
  Filled 2018-04-04: qty 250

## 2018-04-04 MED ORDER — BISACODYL 5 MG PO TBEC
10.0000 mg | DELAYED_RELEASE_TABLET | Freq: Every day | ORAL | Status: DC
  Administered 2018-04-05 – 2018-04-08 (×3): 10 mg via ORAL
  Filled 2018-04-04 (×4): qty 2

## 2018-04-04 MED ORDER — ACETAMINOPHEN 650 MG RE SUPP: 650.0000 mg | Freq: Once | RECTAL | Status: AC

## 2018-04-04 MED ORDER — ROCURONIUM BROMIDE 10 MG/ML (PF) SYRINGE
PREFILLED_SYRINGE | INTRAVENOUS | Status: AC
  Filled 2018-04-04: qty 30

## 2018-04-04 MED ORDER — SODIUM CHLORIDE 0.9 % IV SOLN
INTRAVENOUS | Status: DC
  Administered 2018-04-04: 10 mL/h via INTRAVENOUS

## 2018-04-04 MED ORDER — INSULIN REGULAR BOLUS VIA INFUSION
0.0000 [IU] | Freq: Three times a day (TID) | INTRAVENOUS | Status: DC
  Filled 2018-04-04: qty 10

## 2018-04-04 MED ORDER — SODIUM CHLORIDE 0.9 % IV SOLN
INTRAVENOUS | Status: DC | PRN
  Administered 2018-04-04: 750 mg via INTRAVENOUS

## 2018-04-04 MED ORDER — BISACODYL 10 MG RE SUPP
10.0000 mg | Freq: Every day | RECTAL | Status: DC
  Administered 2018-04-07: 10 mg via RECTAL
  Filled 2018-04-04: qty 1

## 2018-04-04 MED ORDER — FENTANYL CITRATE (PF) 250 MCG/5ML IJ SOLN
INTRAMUSCULAR | Status: DC | PRN
  Administered 2018-04-04: 50 ug via INTRAVENOUS
  Administered 2018-04-04: 100 ug via INTRAVENOUS
  Administered 2018-04-04: 200 ug via INTRAVENOUS
  Administered 2018-04-04: 100 ug via INTRAVENOUS
  Administered 2018-04-04 (×2): 150 ug via INTRAVENOUS
  Administered 2018-04-04: 100 ug via INTRAVENOUS
  Administered 2018-04-04: 250 ug via INTRAVENOUS
  Administered 2018-04-04: 100 ug via INTRAVENOUS
  Administered 2018-04-04 (×2): 150 ug via INTRAVENOUS

## 2018-04-04 MED ORDER — METOPROLOL TARTRATE 12.5 MG HALF TABLET
12.5000 mg | ORAL_TABLET | Freq: Two times a day (BID) | ORAL | Status: DC
  Administered 2018-04-05 – 2018-04-07 (×4): 12.5 mg via ORAL
  Filled 2018-04-04 (×4): qty 1

## 2018-04-04 MED ORDER — NITROGLYCERIN IN D5W 200-5 MCG/ML-% IV SOLN: 0.0000 ug/min | INTRAVENOUS | Status: DC

## 2018-04-04 MED ORDER — NITROGLYCERIN IN D5W 200-5 MCG/ML-% IV SOLN: INTRAVENOUS | Status: DC | PRN

## 2018-04-04 MED ORDER — PROPOFOL 10 MG/ML IV BOLUS
INTRAVENOUS | Status: AC
  Filled 2018-04-04: qty 20

## 2018-04-04 MED ORDER — THROMBIN 5000 UNITS EX SOLR
CUTANEOUS | Status: AC
  Filled 2018-04-04: qty 5000

## 2018-04-04 MED ORDER — DOCUSATE SODIUM 100 MG PO CAPS
200.0000 mg | ORAL_CAPSULE | Freq: Every day | ORAL | Status: DC
  Administered 2018-04-05 – 2018-04-08 (×4): 200 mg via ORAL
  Filled 2018-04-04 (×6): qty 2

## 2018-04-04 MED ORDER — PROTAMINE SULFATE 10 MG/ML IV SOLN
INTRAVENOUS | Status: DC | PRN
  Administered 2018-04-04: 80 mg via INTRAVENOUS
  Administered 2018-04-04: 50 mg via INTRAVENOUS
  Administered 2018-04-04: 100 mg via INTRAVENOUS

## 2018-04-04 MED ORDER — TRAMADOL HCL 50 MG PO TABS
50.0000 mg | ORAL_TABLET | ORAL | Status: DC | PRN
  Administered 2018-04-05 (×2): 100 mg via ORAL
  Administered 2018-04-05 – 2018-04-06 (×2): 50 mg via ORAL
  Administered 2018-04-06 – 2018-04-09 (×3): 100 mg via ORAL
  Administered 2018-04-10: 50 mg via ORAL
  Administered 2018-04-12: 100 mg via ORAL
  Administered 2018-04-12: 50 mg via ORAL
  Filled 2018-04-04 (×2): qty 1
  Filled 2018-04-04 (×5): qty 2
  Filled 2018-04-04 (×2): qty 1
  Filled 2018-04-04: qty 2

## 2018-04-04 MED ORDER — DEXMEDETOMIDINE HCL IN NACL 400 MCG/100ML IV SOLN
0.0000 ug/kg/h | INTRAVENOUS | Status: DC
  Administered 2018-04-04: 0.5 ug/kg/h via INTRAVENOUS
  Filled 2018-04-04: qty 100

## 2018-04-04 MED ORDER — ALBUMIN HUMAN 5 % IV SOLN
INTRAVENOUS | Status: DC | PRN
  Administered 2018-04-04: 13:00:00 via INTRAVENOUS

## 2018-04-04 MED ORDER — POTASSIUM CHLORIDE 10 MEQ/50ML IV SOLN
10.0000 meq | Freq: Once | INTRAVENOUS | Status: AC
  Administered 2018-04-04: 10 meq via INTRAVENOUS
  Filled 2018-04-04: qty 50

## 2018-04-04 MED ORDER — PROTAMINE SULFATE 10 MG/ML IV SOLN
INTRAVENOUS | Status: AC
  Filled 2018-04-04: qty 25

## 2018-04-04 MED ORDER — MIDAZOLAM HCL 2 MG/2ML IJ SOLN: 2.0000 mg | INTRAMUSCULAR | Status: DC | PRN

## 2018-04-04 MED ORDER — DEXAMETHASONE SODIUM PHOSPHATE 10 MG/ML IJ SOLN
INTRAMUSCULAR | Status: DC | PRN
  Administered 2018-04-04: 4 mg via INTRAVENOUS

## 2018-04-04 MED ORDER — METOPROLOL TARTRATE 5 MG/5ML IV SOLN
2.5000 mg | INTRAVENOUS | Status: DC | PRN
  Administered 2018-04-07 – 2018-04-08 (×3): 5 mg via INTRAVENOUS
  Filled 2018-04-04 (×3): qty 5

## 2018-04-04 MED FILL — Potassium Chloride Inj 2 mEq/ML: INTRAVENOUS | Qty: 40 | Status: AC

## 2018-04-04 MED FILL — Heparin Sodium (Porcine) Inj 1000 Unit/ML: INTRAMUSCULAR | Qty: 30 | Status: AC

## 2018-04-04 MED FILL — Magnesium Sulfate Inj 50%: INTRAMUSCULAR | Qty: 10 | Status: AC

## 2018-04-04 SURGICAL SUPPLY — 107 items
ADH SKN CLS APL DERMABOND .7 (GAUZE/BANDAGES/DRESSINGS) ×6
BAG DECANTER FOR FLEXI CONT (MISCELLANEOUS) ×5 IMPLANT
BANDAGE ACE 4X5 VEL STRL LF (GAUZE/BANDAGES/DRESSINGS) ×9 IMPLANT
BANDAGE ACE 6X5 VEL STRL LF (GAUZE/BANDAGES/DRESSINGS) ×9 IMPLANT
BASKET HEART  (ORDER IN 25'S) (MISCELLANEOUS) ×1
BASKET HEART (ORDER IN 25'S) (MISCELLANEOUS) ×1
BASKET HEART (ORDER IN 25S) (MISCELLANEOUS) ×3 IMPLANT
BLADE STERNUM SYSTEM 6 (BLADE) ×5 IMPLANT
BNDG GAUZE ELAST 4 BULKY (GAUZE/BANDAGES/DRESSINGS) ×9 IMPLANT
CANISTER SUCT 3000ML PPV (MISCELLANEOUS) ×5 IMPLANT
CATH ROBINSON RED A/P 18FR (CATHETERS) ×10 IMPLANT
CATH THORACIC 28FR (CATHETERS) ×5 IMPLANT
CATH THORACIC 36FR (CATHETERS) ×5 IMPLANT
CATH THORACIC 36FR RT ANG (CATHETERS) ×5 IMPLANT
CLIP FOGARTY SPRING 6M (CLIP) ×2 IMPLANT
CLIP VESOCCLUDE MED 24/CT (CLIP) IMPLANT
CLIP VESOCCLUDE SM WIDE 24/CT (CLIP) ×4 IMPLANT
CRADLE DONUT ADULT HEAD (MISCELLANEOUS) ×5 IMPLANT
DERMABOND ADVANCED (GAUZE/BANDAGES/DRESSINGS) ×4
DERMABOND ADVANCED .7 DNX12 (GAUZE/BANDAGES/DRESSINGS) IMPLANT
DRAPE CARDIOVASCULAR INCISE (DRAPES) ×5
DRAPE SLUSH/WARMER DISC (DRAPES) ×5 IMPLANT
DRAPE SRG 135X102X78XABS (DRAPES) ×3 IMPLANT
DRESSING AQUACEL AG SP 3.5X10 (GAUZE/BANDAGES/DRESSINGS) IMPLANT
DRSG AQUACEL AG SP 3.5X10 (GAUZE/BANDAGES/DRESSINGS) ×5
DRSG COVADERM 4X14 (GAUZE/BANDAGES/DRESSINGS) ×5 IMPLANT
ELECT CAUTERY BLADE 6.4 (BLADE) ×5 IMPLANT
ELECT REM PT RETURN 9FT ADLT (ELECTROSURGICAL) ×10
ELECTRODE REM PT RTRN 9FT ADLT (ELECTROSURGICAL) ×6 IMPLANT
FELT TEFLON 1X6 (MISCELLANEOUS) ×10 IMPLANT
GAUZE SPONGE 4X4 12PLY STRL (GAUZE/BANDAGES/DRESSINGS) ×10 IMPLANT
GAUZE SPONGE 4X4 12PLY STRL LF (GAUZE/BANDAGES/DRESSINGS) ×4 IMPLANT
GLOVE BIO SURGEON STRL SZ 6 (GLOVE) ×2 IMPLANT
GLOVE BIO SURGEON STRL SZ 6.5 (GLOVE) ×1 IMPLANT
GLOVE BIO SURGEON STRL SZ7 (GLOVE) ×4 IMPLANT
GLOVE BIO SURGEON STRL SZ7.5 (GLOVE) ×4 IMPLANT
GLOVE BIO SURGEONS STRL SZ 6.5 (GLOVE) ×1
GLOVE BIOGEL PI IND STRL 6 (GLOVE) IMPLANT
GLOVE BIOGEL PI IND STRL 6.5 (GLOVE) IMPLANT
GLOVE BIOGEL PI IND STRL 7.0 (GLOVE) IMPLANT
GLOVE BIOGEL PI INDICATOR 6 (GLOVE)
GLOVE BIOGEL PI INDICATOR 6.5 (GLOVE)
GLOVE BIOGEL PI INDICATOR 7.0 (GLOVE) ×6
GLOVE EUDERMIC 7 POWDERFREE (GLOVE) ×10 IMPLANT
GLOVE ORTHO TXT STRL SZ7.5 (GLOVE) IMPLANT
GOWN STRL REUS W/ TWL LRG LVL3 (GOWN DISPOSABLE) ×12 IMPLANT
GOWN STRL REUS W/ TWL XL LVL3 (GOWN DISPOSABLE) ×3 IMPLANT
GOWN STRL REUS W/TWL LRG LVL3 (GOWN DISPOSABLE) ×20
GOWN STRL REUS W/TWL XL LVL3 (GOWN DISPOSABLE) ×5
HEMOSTAT POWDER SURGIFOAM 1G (HEMOSTASIS) ×15 IMPLANT
HEMOSTAT SURGICEL 2X14 (HEMOSTASIS) ×7 IMPLANT
INSERT FOGARTY 61MM (MISCELLANEOUS) IMPLANT
INSERT FOGARTY XLG (MISCELLANEOUS) IMPLANT
KIT BASIN OR (CUSTOM PROCEDURE TRAY) ×5 IMPLANT
KIT CATH CPB BARTLE (MISCELLANEOUS) ×5 IMPLANT
KIT SUCTION CATH 14FR (SUCTIONS) ×5 IMPLANT
KIT TURNOVER KIT B (KITS) ×5 IMPLANT
KIT VASOVIEW HEMOPRO VH 3000 (KITS) ×5 IMPLANT
LOOP VESSEL MAXI BLUE (MISCELLANEOUS) ×2 IMPLANT
NS IRRIG 1000ML POUR BTL (IV SOLUTION) ×25 IMPLANT
PACK E OPEN HEART (SUTURE) ×5 IMPLANT
PACK OPEN HEART (CUSTOM PROCEDURE TRAY) ×5 IMPLANT
PAD ARMBOARD 7.5X6 YLW CONV (MISCELLANEOUS) ×10 IMPLANT
PAD ELECT DEFIB RADIOL ZOLL (MISCELLANEOUS) ×5 IMPLANT
PENCIL BUTTON HOLSTER BLD 10FT (ELECTRODE) ×5 IMPLANT
PUNCH AORTIC ROT 4.0MM RCL 40 (MISCELLANEOUS) ×2 IMPLANT
PUNCH AORTIC ROTATE 4.0MM (MISCELLANEOUS) IMPLANT
PUNCH AORTIC ROTATE 4.5MM 8IN (MISCELLANEOUS) ×5 IMPLANT
PUNCH AORTIC ROTATE 5MM 8IN (MISCELLANEOUS) IMPLANT
SENSOR MYOCARDIAL TEMP (MISCELLANEOUS) ×2 IMPLANT
SET CARDIOPLEGIA MPS 5001102 (MISCELLANEOUS) ×2 IMPLANT
SPONGE INTESTINAL PEANUT (DISPOSABLE) IMPLANT
SPONGE LAP 18X18 X RAY DECT (DISPOSABLE) ×2 IMPLANT
SPONGE LAP 4X18 RFD (DISPOSABLE) ×7 IMPLANT
SUT BONE WAX W31G (SUTURE) ×5 IMPLANT
SUT MNCRL AB 4-0 PS2 18 (SUTURE) IMPLANT
SUT PROLENE 3 0 SH DA (SUTURE) IMPLANT
SUT PROLENE 3 0 SH1 36 (SUTURE) ×5 IMPLANT
SUT PROLENE 4 0 RB 1 (SUTURE)
SUT PROLENE 4 0 SH DA (SUTURE) IMPLANT
SUT PROLENE 4-0 RB1 .5 CRCL 36 (SUTURE) IMPLANT
SUT PROLENE 5 0 C 1 36 (SUTURE) IMPLANT
SUT PROLENE 6 0 C 1 30 (SUTURE) ×2 IMPLANT
SUT PROLENE 7 0 BV 1 (SUTURE) IMPLANT
SUT PROLENE 7 0 BV1 MDA (SUTURE) ×9 IMPLANT
SUT PROLENE 8 0 BV175 6 (SUTURE) IMPLANT
SUT SILK  1 MH (SUTURE)
SUT SILK 1 MH (SUTURE) IMPLANT
SUT STEEL 6MS V (SUTURE) ×4 IMPLANT
SUT STEEL STERNAL CCS#1 18IN (SUTURE) IMPLANT
SUT STEEL SZ 6 DBL 3X14 BALL (SUTURE) IMPLANT
SUT VIC AB 1 CTX 36 (SUTURE) ×10
SUT VIC AB 1 CTX36XBRD ANBCTR (SUTURE) ×6 IMPLANT
SUT VIC AB 2-0 CT1 27 (SUTURE) ×15
SUT VIC AB 2-0 CT1 TAPERPNT 27 (SUTURE) IMPLANT
SUT VIC AB 2-0 CTX 27 (SUTURE) IMPLANT
SUT VIC AB 3-0 SH 27 (SUTURE)
SUT VIC AB 3-0 SH 27X BRD (SUTURE) IMPLANT
SUT VIC AB 3-0 X1 27 (SUTURE) ×6 IMPLANT
SUT VICRYL 4-0 PS2 18IN ABS (SUTURE) IMPLANT
SYSTEM SAHARA CHEST DRAIN ATS (WOUND CARE) ×5 IMPLANT
TOWEL GREEN STERILE (TOWEL DISPOSABLE) ×5 IMPLANT
TOWEL GREEN STERILE FF (TOWEL DISPOSABLE) ×5 IMPLANT
TRAY FOLEY SLVR 16FR TEMP STAT (SET/KITS/TRAYS/PACK) ×5 IMPLANT
TUBING INSUFFLATION (TUBING) ×5 IMPLANT
UNDERPAD 30X30 (UNDERPADS AND DIAPERS) ×5 IMPLANT
WATER STERILE IRR 1000ML POUR (IV SOLUTION) ×10 IMPLANT

## 2018-04-04 NOTE — Op Note (Signed)
CARDIOVASCULAR SURGERY OPERATIVE NOTE  04/04/2018  Surgeon:  Gaye Pollack, MD  First Assistant: Jadene Pierini,  PA-C   Preoperative Diagnosis:  Severe multi-vessel coronary artery disease   Postoperative Diagnosis:  Same   Procedure:  1. Median Sternotomy 2. Extracorporeal circulation 3.   Coronary artery bypass grafting x 5   Left internal mammary graft to the LAD  SVG to diagonal  SVG to OM  Sequential SVG to distal RCA and PDA 4.   Endoscopic vein harvest from the right and left legs   Anesthesia:  General Endotracheal   Clinical History/Surgical Indication:  The patient is an 84 year old male who is recently been evaluated by cardiology for progressive symptoms of angina.  He has a past medical history of atherosclerotic vascular disease including history of previous carotid endarterectomy.  Most recently he has been experiencing episodes of chest tightness with exertion which radiation into the left arm.  He does get relief with nitroglycerin from these episodes.  Other cardiac risk factors include dyslipidemia, history of TIAs that is post left carotid endarterectomy, and hypertension, to the progressive nature of the symptoms as well as significant risk factors it was felt that he should undergo cardiac catheterization which showed severe multi-vessel coronary artery disease. After review of the cath films it was felt that CABG was the best treatment for him. I discussed the operative procedure with the patient and family including alternatives, benefits and risks; including but not limited to bleeding, blood transfusion, infection, stroke, myocardial infarction, graft failure, heart block requiring a permanent pacemaker, organ dysfunction, and death.  Joseph Mcmillan understands and agrees to proceed.     Preparation:  The patient was seen in the preoperative holding area and the  correct patient, correct operation were confirmed with the patient after reviewing the medical record and catheterization. The consent was signed by me. Preoperative antibiotics were given. A pulmonary arterial line and radial arterial line were placed by the anesthesia team. The patient was taken back to the operating room and positioned supine on the operating room table. After being placed under general endotracheal anesthesia by the anesthesia team a foley catheter was placed. The neck, chest, abdomen, and both legs were prepped with betadine soap and solution and draped in the usual sterile manner. A surgical time-out was taken and the correct patient and operative procedure were confirmed with the nursing and anesthesia staff.   TEE: performed by Dr. Laurie Panda:  This showed normal LV systolic function with no significant MR.   Cardiopulmonary Bypass:  A median sternotomy was performed. The pericardium was opened in the midline. Right ventricular function appeared normal. The ascending aorta was of normal size and had no palpable plaque. There were no contraindications to aortic cannulation or cross-clamping. The patient was fully systemically heparinized and the ACT was maintained > 400 sec. The proximal aortic arch was cannulated with a 20 F aortic cannula for arterial inflow. Venous cannulation was performed via the right atrial appendage using a two-staged venous cannula. An antegrade cardioplegia/vent cannula was inserted into the mid-ascending aorta. Aortic occlusion was performed with a single cross-clamp. Systemic cooling to 32 degrees Centigrade and topical cooling of the heart with iced saline were used. Hyperkalemic antegrade cold blood cardioplegia was used to induce diastolic arrest and was then given at about 20 minute intervals throughout the period of arrest to maintain myocardial temperature at or below 10 degrees centigrade. A temperature probe was inserted into the  interventricular septum and an insulating  pad was placed in the pericardium.   Left internal mammary harvest:  The left side of the sternum was retracted using the Rultract retractor. The left internal mammary artery was harvested as a pedicle graft. All side branches were clipped. It was a medium-sized vessel of good quality with excellent blood flow. It was ligated distally and divided. It was sprayed with topical papaverine solution to prevent vasospasm.   Endoscopic vein harvest:  The right greater saphenous vein was harvested endoscopically through a 2 cm incision medial to the right knee. It was harvested from the thigh. It was a small to medium-sized vein of good quality but below the knee it was too small. Therefore a second segment of greater saphenous vein was harvested endoscopically through a 2 cm incision medial to the left knee.  It was harvested from the thigh. It was a small to medium-sized vein of good quality The side branches were all ligated with 4-0 silk ties.    Coronary arteries:  The coronary arteries were examined.   LAD:  Diffusely diseased with segment calcified plaque. The diagonal was small but graft-able.  LCX:  Moderate sized OM with mild distal disease.  RCA:  The main vessel was diffusely diseased with calcific plaque but distally before the PDA was fairly soft and large. The PDA had heavy plaque at its origin but the proximal to mid vessel was soft. The PL branch was small where it was visible and not suitable for grafting so I decided to graft the distal RCA and PDA to provide blood to the PDA and PL.   Grafts:  1. LIMA to the LAD: 1.75 mm. It was sewn end to side using 8-0 prolene continuous suture. 2. SVG to diagonal:  1.5 mm. It was sewn end to side using 7-0 prolene continuous suture. 3. SVG to OM:  1.75 mm. It was sewn end to side using 7-0 prolene continuous suture. 4. Sequential SVG to distal RCA:  3 mm. It was sewn sequential side to side using  7-0 prolene continuous suture. 5.   Sequential SVG to PDA:  1.6 mm. It was sewn sequential end to side using 7-0 prolene continuous suture.  The proximal vein graft anastomoses were performed to the mid-ascending aorta using continuous 6-0 prolene suture. Graft markers were placed around the proximal anastomoses.   Completion:  The patient was rewarmed to 37 degrees Centigrade. The clamp was removed from the LIMA pedicle and there was rapid warming of the septum and return of ventricular fibrillation. The crossclamp was removed with a time of 86 minutes. There was spontaneous return of sinus rhythm. The distal and proximal anastomoses were checked for hemostasis. The position of the grafts was satisfactory. Two temporary epicardial pacing wires were placed on the right atrium and two on the right ventricle. The patient was weaned from CPB without difficulty on no inotropes. CPB time was 102 minutes. Cardiac output was 5 LPM. Heparin was fully reversed with protamine and the aortic and venous cannulas removed. Hemostasis was achieved. Mediastinal and left pleural drainage tubes were placed. The sternum was closed with  #6 stainless steel wires. The fascia was closed with continuous # 1 vicryl suture. The subcutaneous tissue was closed with 2-0 vicryl continuous suture. The skin was closed with 3-0 vicryl subcuticular suture. All sponge, needle, and instrument counts were reported correct at the end of the case. Dry sterile dressings were placed over the incisions and around the chest tubes which were connected to pleurevac suction.  The patient was then transported to the surgical intensive care unit in stable condition.

## 2018-04-04 NOTE — Plan of Care (Signed)
  Problem: Skin Integrity: Goal: Risk for impaired skin integrity will decrease Outcome: Progressing   

## 2018-04-04 NOTE — Consult Note (Signed)
            [facility] CM Primary Care Navigator  04/04/2018  HARTMAN MINAHAN [birth year] [id number]   Attempt to see patient at the bedside to identify possible discharge needs but he is off the unit in the OR for surgery at this time per staff report. (CABG- coronary artery bypass grafting x 5)  Will attempt to see patient at another time when he is available in the room.    Addendum:  Went back to see patient at the bedside to identify possible discharge needs but he was transferred to cardiac ICU 2H 26 from 6E 02 after surgery.  Will try to see patient at another time when he is already out of ICU.     For additional questions please contact:  Edwena Felty A. Rayshon Albaugh, BSN, RN-BC [facility] PRIMARY CARE Navigator Cell: [phone]

## 2018-04-04 NOTE — Progress Notes (Signed)
      [address]       [place] [address]             [phone]      S/p CABG x 5  Intubated, sedated  BP 108/69   Pulse 80   Temp 97.9 F (36.6 C)   Resp 12   Ht 5\' 9"  (1.753 m)   Wt 65.6 kg   SpO2 100%   BMI 21.37 kg/m  Co= 2.97, CI 1.65 PA= 19/10   Intake/Output Summary (Last 24 hours) at 04/04/2018 1900 Last data filed at 04/04/2018 1813 Gross per 24 hour  Intake 3919.93 ml  Output 3475 ml  Net 444.93 ml   Initial high CT output, now tapering off  Hct= 26, PLT 153K  Watch for any further bleeding.   Volume resuscitation  Revonda Standard. Roxan Hockey, MD [facility] [phone]

## 2018-04-04 NOTE — Progress Notes (Signed)
CT Surgery:  Stable weekend without chest pain or shortness of breath. Ready for CABG this am.

## 2018-04-04 NOTE — Transfer of Care (Signed)
Immediate Anesthesia Transfer of Care Note  Patient: Joseph Mcmillan  Procedure(s) Performed: CORONARY ARTERY BYPASS GRAFTING (CABG)TIMES 5, USING LEFT  INTERNAL MAMMARY ARTERY AND ENDOSCOPICALLY HARVESTED RIGHT AND LEFT SAPHENOUS VEIN (N/A Chest) TRANSESOPHAGEAL ECHOCARDIOGRAM (TEE) (N/A ) ENDOVEIN HARVEST OF GREATER SAPHENOUS VEIN (Right Leg Upper)  Patient Location: SICU  Anesthesia Type:General  Level of Consciousness: sedated, unresponsive and Patient remains intubated per anesthesia plan  Airway & Oxygen Therapy: Patient remains intubated per anesthesia plan and Patient placed on Ventilator (see vital sign flow sheet for setting)  Post-op Assessment: Report given to RN and Post -op Vital signs reviewed and stable  Post vital signs: Reviewed and stable  Last Vitals:  Vitals Value Taken Time  BP    Temp    Pulse    Resp 12 04/04/2018  1:34 PM  SpO2    Vitals shown include unvalidated device data.  Last Pain:  Vitals:   04/04/18 0414  TempSrc: Oral  PainSc:       Patients Stated Pain Goal: 0 (59/45/85 9292)  Complications: No apparent anesthesia complications

## 2018-04-04 NOTE — Anesthesia Preprocedure Evaluation (Addendum)
Anesthesia Evaluation  Patient identified by MRN, date of birth, ID band Patient awake    Reviewed: Allergy & Precautions, NPO status , Patient's Chart, lab work & pertinent test results  History of Anesthesia Complications Negative for: history of anesthetic complications  Airway Mallampati: II  TM Distance: >3 FB Neck ROM: Full    Dental  (+) Missing, Dental Advisory Given   Pulmonary neg pulmonary ROS,    breath sounds clear to auscultation       Cardiovascular hypertension, Pt. on medications + angina + CAD and + Peripheral Vascular Disease   Rhythm:Regular     Neuro/Psych negative neurological ROS  negative psych ROS   GI/Hepatic   Endo/Other    Renal/GU      Musculoskeletal   Abdominal   Peds  Hematology  (+) anemia ,   Anesthesia Other Findings   Reproductive/Obstetrics                            Anesthesia Physical Anesthesia Plan  ASA: IV  Anesthesia Plan: General   Post-op Pain Management:    Induction: Intravenous  PONV Risk Score and Plan: 2 and Treatment may vary due to age or medical condition  Airway Management Planned: Oral ETT  Additional Equipment: Arterial line, CVP, PA Cath, TEE and Ultrasound Guidance Line Placement  Intra-op Plan:   Post-operative Plan: Post-operative intubation/ventilation  Informed Consent: I have reviewed the patients History and Physical, chart, labs and discussed the procedure including the risks, benefits and alternatives for the proposed anesthesia with the patient or authorized representative who has indicated his/her understanding and acceptance.   Dental advisory given  Plan Discussed with: CRNA and Surgeon  Anesthesia Plan Comments:         Anesthesia Quick Evaluation

## 2018-04-04 NOTE — Anesthesia Procedure Notes (Signed)
Arterial Line Insertion Start/End8/13/2019 6:45 AM, 04/04/2018 6:50 AM Performed by: CRNA  Patient location: Pre-op. Preanesthetic checklist: patient identified, IV checked, site marked, risks and benefits discussed, surgical consent, monitors and equipment checked, pre-op evaluation, timeout performed and anesthesia consent Lidocaine 1% used for infiltration Left, radial was placed Catheter size: 20 G Hand hygiene performed , maximum sterile barriers used  and Seldinger technique used Allen's test indicative of satisfactory collateral circulation Attempts: 1 Procedure performed without using ultrasound guided technique. Following insertion, dressing applied and Biopatch. Post procedure assessment: normal  Patient tolerated the procedure well with no immediate complications.

## 2018-04-04 NOTE — Anesthesia Procedure Notes (Signed)
Central Venous Catheter Insertion Performed by: Oleta Mouse, MD, anesthesiologist Start/End8/13/2019 6:48 AM, 04/04/2018 6:54 AM Patient location: Pre-op. Preanesthetic checklist: patient identified, IV checked, site marked, risks and benefits discussed, surgical consent, monitors and equipment checked, pre-op evaluation, timeout performed and anesthesia consent Hand hygiene performed  and maximum sterile barriers used  PA cath was placed.Swan type:thermodilution Procedure performed without using ultrasound guided technique. Attempts: 1 Patient tolerated the procedure well with no immediate complications.

## 2018-04-04 NOTE — Anesthesia Procedure Notes (Signed)
Central Venous Catheter Insertion Performed by: Oleta Mouse, MD, anesthesiologist Start/End8/13/2019 6:48 AM, 04/04/2018 6:54 AM Patient location: Pre-op. Preanesthetic checklist: patient identified, IV checked, site marked, risks and benefits discussed, surgical consent, monitors and equipment checked, pre-op evaluation, timeout performed and anesthesia consent Position: supine Lidocaine 1% used for infiltration and patient sedated Hand hygiene performed  and maximum sterile barriers used  Catheter size: 9 Fr Total catheter length 10. MAC introducer Procedure performed using ultrasound guided technique. Ultrasound Notes:anatomy identified, needle tip was noted to be adjacent to the nerve/plexus identified, no ultrasound evidence of intravascular and/or intraneural injection and image(s) printed for medical record Attempts: 1 Following insertion, line sutured, dressing applied and Biopatch. Post procedure assessment: blood return through all ports, free fluid flow and no air  Patient tolerated the procedure well with no immediate complications.

## 2018-04-04 NOTE — Progress Notes (Signed)
Weaning was initiated at this time, shortly thereafter immediately terminated per RRT due to increase somnolence. RN aware. Weaning will be initiated again at a later time.

## 2018-04-04 NOTE — Anesthesia Procedure Notes (Signed)
Procedure Name: Intubation Date/Time: 04/04/2018 7:38 AM Performed by: Renato Shin, CRNA Pre-anesthesia Checklist: Patient identified, Emergency Drugs available, Suction available and Patient being monitored Patient Re-evaluated:Patient Re-evaluated prior to induction Oxygen Delivery Method: Circle system utilized Preoxygenation: Pre-oxygenation with 100% oxygen Induction Type: IV induction Ventilation: Two handed mask ventilation required Laryngoscope Size: Miller and 3 Grade View: Grade II Tube type: Oral Tube size: 8.0 mm Number of attempts: 1 Airway Equipment and Method: Stylet Placement Confirmation: ETT inserted through vocal cords under direct vision,  positive ETCO2,  CO2 detector and breath sounds checked- equal and bilateral Secured at: 21 cm Tube secured with: Tape Dental Injury: Teeth and Oropharynx as per pre-operative assessment

## 2018-04-04 NOTE — Anesthesia Postprocedure Evaluation (Signed)
Anesthesia Post Note  Patient: Joseph Mcmillan  Procedure(s) Performed: CORONARY ARTERY BYPASS GRAFTING (CABG)TIMES 5, USING LEFT  INTERNAL MAMMARY ARTERY AND ENDOSCOPICALLY HARVESTED RIGHT AND LEFT SAPHENOUS VEIN (N/A Chest) TRANSESOPHAGEAL ECHOCARDIOGRAM (TEE) (N/A ) ENDOVEIN HARVEST OF GREATER SAPHENOUS VEIN (Right Leg Upper)     Patient location during evaluation: SICU Anesthesia Type: General Level of consciousness: sedated Pain management: pain level controlled Vital Signs Assessment: post-procedure vital signs reviewed and stable Respiratory status: patient remains intubated per anesthesia plan Cardiovascular status: stable Postop Assessment: no apparent nausea or vomiting Anesthetic complications: no    Last Vitals:  Vitals:   04/04/18 1530 04/04/18 1537  BP: 106/69   Pulse: 80 80  Resp: 12 12  Temp: (!) 35.4 C (!) 35.4 C  SpO2: 100% 100%    Last Pain:  Vitals:   04/04/18 0414  TempSrc: Oral  PainSc:                  Thayne Cindric

## 2018-04-04 NOTE — Brief Op Note (Signed)
04/04/2018  1:54 PM  PATIENT:  Joseph Mcmillan  84 y.o. male  PRE-OPERATIVE DIAGNOSIS:  CAD  POST-OPERATIVE DIAGNOSIS:  CAD  PROCEDURE:  Procedure(s): CORONARY ARTERY BYPASS GRAFTING (CABG)TIMES 5, USING LEFT  INTERNAL MAMMARY ARTERY AND ENDOSCOPICALLY HARVESTED RIGHT AND LEFT SAPHENOUS VEIN (N/A) TRANSESOPHAGEAL ECHOCARDIOGRAM (TEE) (N/A) ENDOVEIN HARVEST OF GREATER SAPHENOUS VEIN (Right)  SURGEON:  Surgeon(s) and Role:    * Bartle, Fernande Boyden, MD - Primary  PHYSICIAN ASSISTANT: Jadene Pierini, PA-C    ANESTHESIA:   none  EBL:  200 mL   BLOOD ADMINISTERED:none  DRAINS: 2 mediastinal and one left pleural Chest Tube(s)  LOCAL MEDICATIONS USED:  NONE  SPECIMEN:  No Specimen  DISPOSITION OF SPECIMEN:  N/A  COUNTS:  YES  TOURNIQUET:  * No tourniquets in log *  DICTATION: .Note written in EPIC  PLAN OF CARE: Admit to inpatient   PATIENT DISPOSITION:  ICU - intubated and hemodynamically stable.   Delay start of Pharmacological VTE agent (>24hrs) due to surgical blood loss or risk of bleeding: yes

## 2018-04-05 ENCOUNTER — Encounter (HOSPITAL_COMMUNITY): Payer: Self-pay | Admitting: Surgery

## 2018-04-05 ENCOUNTER — Inpatient Hospital Stay (HOSPITAL_COMMUNITY): Payer: Medicare Other

## 2018-04-05 LAB — POCT I-STAT 3, ART BLOOD GAS (G3+)
ACID-BASE DEFICIT: 3 mmol/L — AB (ref 0.0–2.0)
BICARBONATE: 25.2 mmol/L (ref 20.0–28.0)
BICARBONATE: 24.9 mmol/L (ref 20.0–28.0)
Bicarbonate: 22.6 mmol/L (ref 20.0–28.0)
O2 SAT: 99 %
O2 SAT: 98 %
O2 Saturation: 100 %
PH ART: 7.392 (ref 7.350–7.450)
PO2 ART: 181 mmHg — AB (ref 83.0–108.0)
PO2 ART: 139 mmHg — AB (ref 83.0–108.0)
PO2 ART: 102 mmHg (ref 83.0–108.0)
Patient temperature: 36
TCO2: 26 mmol/L (ref 22–32)
TCO2: 26 mmol/L (ref 22–32)
TCO2: 24 mmol/L (ref 22–32)
pCO2 arterial: 41.2 mmHg (ref 32.0–48.0)
pCO2 arterial: 41.4 mmHg (ref 32.0–48.0)
pCO2 arterial: 40.9 mmHg (ref 32.0–48.0)
pH, Arterial: 7.386 (ref 7.350–7.450)
pH, Arterial: 7.346 — ABNORMAL LOW (ref 7.350–7.450)

## 2018-04-05 LAB — CBC
HCT: 25 % — ABNORMAL LOW (ref 39.0–52.0)
HEMATOCRIT: 24.4 % — AB (ref 39.0–52.0)
HEMOGLOBIN: 8.5 g/dL — AB (ref 13.0–17.0)
HEMOGLOBIN: 8.1 g/dL — AB (ref 13.0–17.0)
MCH: 31.4 pg (ref 26.0–34.0)
MCH: 31.3 pg (ref 26.0–34.0)
MCHC: 34 g/dL (ref 30.0–36.0)
MCHC: 33.2 g/dL (ref 30.0–36.0)
MCV: 92.3 fL (ref 78.0–100.0)
MCV: 94.2 fL (ref 78.0–100.0)
PLATELETS: 137 10*3/uL — AB (ref 150–400)
Platelets: 133 10*3/uL — ABNORMAL LOW (ref 150–400)
RBC: 2.71 MIL/uL — AB (ref 4.22–5.81)
RBC: 2.59 MIL/uL — AB (ref 4.22–5.81)
RDW: 12.9 % (ref 11.5–15.5)
RDW: 13.2 % (ref 11.5–15.5)
WBC: 10.5 10*3/uL (ref 4.0–10.5)
WBC: 12.2 10*3/uL — ABNORMAL HIGH (ref 4.0–10.5)

## 2018-04-05 LAB — CREATININE, SERUM
Creatinine, Ser: 1.31 mg/dL — ABNORMAL HIGH (ref 0.61–1.24)
GFR calc Af Amer: 57 mL/min — ABNORMAL LOW (ref >60)
GFR calc non Af Amer: 49 mL/min — ABNORMAL LOW (ref >60)

## 2018-04-05 LAB — GLUCOSE, CAPILLARY
GLUCOSE-CAPILLARY: 145 mg/dL — AB (ref 70–99)
GLUCOSE-CAPILLARY: 120 mg/dL — AB (ref 70–99)
GLUCOSE-CAPILLARY: 117 mg/dL — AB (ref 70–99)
GLUCOSE-CAPILLARY: 111 mg/dL — AB (ref 70–99)
GLUCOSE-CAPILLARY: 99 mg/dL (ref 70–99)
GLUCOSE-CAPILLARY: 155 mg/dL — AB (ref 70–99)
GLUCOSE-CAPILLARY: 150 mg/dL — AB (ref 70–99)
Glucose-Capillary: 126 mg/dL — ABNORMAL HIGH (ref 70–99)
Glucose-Capillary: 128 mg/dL — ABNORMAL HIGH (ref 70–99)
Glucose-Capillary: 160 mg/dL — ABNORMAL HIGH (ref 70–99)
Glucose-Capillary: 215 mg/dL — ABNORMAL HIGH (ref 70–99)
Glucose-Capillary: 215 mg/dL — ABNORMAL HIGH (ref 70–99)

## 2018-04-05 LAB — BASIC METABOLIC PANEL
ANION GAP: 5 (ref 5–15)
BUN: 18 mg/dL (ref 8–23)
CO2: 25 mmol/L (ref 22–32)
Calcium: 8.3 mg/dL — ABNORMAL LOW (ref 8.9–10.3)
Chloride: 109 mmol/L (ref 98–111)
Creatinine, Ser: 0.95 mg/dL (ref 0.61–1.24)
Glucose, Bld: 110 mg/dL — ABNORMAL HIGH (ref 70–99)
POTASSIUM: 4.1 mmol/L (ref 3.5–5.1)
SODIUM: 139 mmol/L (ref 135–145)

## 2018-04-05 LAB — POCT I-STAT, CHEM 8
BUN: 22 mg/dL (ref 8–23)
CALCIUM ION: 1.2 mmol/L (ref 1.15–1.40)
CHLORIDE: 99 mmol/L (ref 98–111)
CREATININE: 1.1 mg/dL (ref 0.61–1.24)
GLUCOSE: 220 mg/dL — AB (ref 70–99)
HCT: 23 % — ABNORMAL LOW (ref 39.0–52.0)
Hemoglobin: 7.8 g/dL — ABNORMAL LOW (ref 13.0–17.0)
Potassium: 3.8 mmol/L (ref 3.5–5.1)
SODIUM: 138 mmol/L (ref 135–145)
TCO2: 25 mmol/L (ref 22–32)

## 2018-04-05 LAB — BPAM PLATELET PHERESIS
Blood Product Expiration Date: 201908142359
ISSUE DATE / TIME: 201908131541
UNIT TYPE AND RH: 6200

## 2018-04-05 LAB — PREPARE PLATELET PHERESIS: UNIT DIVISION: 0

## 2018-04-05 LAB — MAGNESIUM
MAGNESIUM: 2.7 mg/dL — AB (ref 1.7–2.4)
Magnesium: 2.4 mg/dL (ref 1.7–2.4)

## 2018-04-05 MED ORDER — INSULIN DETEMIR 100 UNIT/ML ~~LOC~~ SOLN
10.0000 [IU] | Freq: Every day | SUBCUTANEOUS | Status: DC
  Administered 2018-04-05 – 2018-04-08 (×4): 10 [IU] via SUBCUTANEOUS
  Filled 2018-04-05 (×4): qty 0.1

## 2018-04-05 MED ORDER — FUROSEMIDE 10 MG/ML IJ SOLN
40.0000 mg | Freq: Two times a day (BID) | INTRAMUSCULAR | Status: AC
  Administered 2018-04-05 (×2): 40 mg via INTRAVENOUS
  Filled 2018-04-05 (×2): qty 4

## 2018-04-05 MED ORDER — INSULIN DETEMIR 100 UNIT/ML ~~LOC~~ SOLN
10.0000 [IU] | Freq: Every day | SUBCUTANEOUS | Status: DC
  Filled 2018-04-05: qty 0.1

## 2018-04-05 MED ORDER — ORAL CARE MOUTH RINSE
15.0000 mL | Freq: Two times a day (BID) | OROMUCOSAL | Status: DC
  Administered 2018-04-05 – 2018-04-06 (×2): 15 mL via OROMUCOSAL

## 2018-04-05 MED ORDER — ALBUMIN HUMAN 5 % IV SOLN
250.0000 mL | Freq: Once | INTRAVENOUS | Status: AC
  Administered 2018-04-05: 250 mL via INTRAVENOUS
  Filled 2018-04-05: qty 250

## 2018-04-05 MED ORDER — INSULIN ASPART 100 UNIT/ML ~~LOC~~ SOLN
0.0000 [IU] | SUBCUTANEOUS | Status: DC
  Administered 2018-04-05: 2 [IU] via SUBCUTANEOUS
  Administered 2018-04-05 (×2): 8 [IU] via SUBCUTANEOUS

## 2018-04-05 MED ORDER — POTASSIUM CHLORIDE 10 MEQ/50ML IV SOLN
10.0000 meq | INTRAVENOUS | Status: AC | PRN
  Administered 2018-04-05 (×3): 10 meq via INTRAVENOUS
  Filled 2018-04-05 (×3): qty 50

## 2018-04-05 NOTE — Progress Notes (Signed)
Attempted to wean again at this time, weaning was aborted per RRT. Patient couldn't initiate his own breaths. Patient RR was dropping as low as 6-8 on the ventilator. Pt was encouraged to breathe but still was not adequately breathing on his own. Decision was made per RRT/RN to place patient back on a Rate and let him rest. May attempt to wean patient again at a later time. Pt is hemodynamically stable, and patient does follow commands.

## 2018-04-05 NOTE — Progress Notes (Signed)
Pt is now on PSV tolerating it well.

## 2018-04-05 NOTE — Progress Notes (Signed)
Inpatient Diabetes Program Recommendations  AACE/ADA: New Consensus Statement on Inpatient Glycemic Control (2019)  Target Ranges:  Prepandial:   less than 140 mg/dL      Peak postprandial:   less than 180 mg/dL (1-2 hours)      Critically ill patients:  140 - 180 mg/dL   Results for [employer], [employer] D "DEAN" (MRN [id number]) as of 04/05/2018 09:39  Ref. Range 04/05/2018 00:12 04/05/2018 01:01 04/05/2018 02:04 04/05/2018 03:01 04/05/2018 04:08 04/05/2018 05:04 04/05/2018 06:04  Glucose-Capillary Latest Ref Range: 70 - 99 mg/dL 126 (H) 128 (H) 120 (H) 117 (H) 111 (H) 99 155 (H)  Results for [employer]ett, Rayon D "DEAN" (MRN [id number]) as of 04/05/2018 09:39  Ref. Range 04/02/2018 04:27  Hemoglobin A1C Latest Ref Range: 4.8 - 5.6 % 6.9 (H)   Review of Glycemic Control  Diabetes history: NO Outpatient Diabetes medications: NA Current orders for Inpatient glycemic control: Levemir 10 units daily, Novolog 0-24 units Q4H  Inpatient Diabetes Program Recommendations: HgbA1C: A1C 6.9% on 04/02/2018. Per ADA, if A1C is 6.5% or greater then criteria met to dx with DM. MD, please indicate in note if patient will be newly dx with DM. If so, please inform patient and nursing staff so patient can be educated while inpatient.   Thanks, Marie Byrd, RN, MSN, CDE Diabetes Coordinator Inpatient Diabetes Program [phone] (Team Pager from 8am to 5pm)    

## 2018-04-05 NOTE — Progress Notes (Signed)
attempting to initiated weaning at this time.

## 2018-04-05 NOTE — Progress Notes (Signed)
1 Day Post-Op Procedure(s) (LRB): CORONARY ARTERY BYPASS GRAFTING (CABG)TIMES 5, USING LEFT  INTERNAL MAMMARY ARTERY AND ENDOSCOPICALLY HARVESTED RIGHT AND LEFT SAPHENOUS VEIN (N/A) TRANSESOPHAGEAL ECHOCARDIOGRAM (TEE) (N/A) ENDOVEIN HARVEST OF GREATER SAPHENOUS VEIN (Right) Subjective: Just extubated this am, slow to wake up overnight. No specific complaints  Objective: Vital signs in last 24 hours: Temp:  [94.6 F (34.8 C)-98.6 F (37 C)] 98.6 F (37 C) (08/14 0620) Pulse Rate:  [60-89] 86 (08/14 0620) Cardiac Rhythm: Atrial paced (08/14 0359) Resp:  [11-19] 16 (08/14 0620) BP: (79-153)/(47-90) 96/60 (08/14 0600) SpO2:  [96 %-100 %] 100 % (08/14 0631) Arterial Line BP: (80-144)/(44-74) 144/49 (08/14 0620) FiO2 (%):  [40 %-50 %] 40 % (08/14 0610) Weight:  [74.6 kg] 74.6 kg (08/14 0600)  Hemodynamic parameters for last 24 hours: PAP: (17-30)/(9-19) 28/19 CO:  [2.5 L/min-4.4 L/min] 4.4 L/min CI:  [1.4 L/min/m2-2.4 L/min/m2] 2.4 L/min/m2  Intake/Output from previous day: 08/13 0701 - 08/14 0700 In: 5923.7 [I.V.:4060.9; Blood:367; IV Piggyback:1495.8] Out: 1610 [RUEAV:4098; Blood:200; Chest Tube:1040] Intake/Output this shift: No intake/output data recorded.  General appearance: alert and cooperative Neurologic: intact Heart: regular rate and rhythm, S1, S2 normal, no murmur, click, rub or gallop Lungs: coarse upper airway noise Extremities: edema moderate Wound: dressing dry  Lab Results: Recent Labs    04/04/18 2020 04/05/18 0402  WBC 10.5 10.5  HGB 9.9* 8.5*  HCT 29.8* 25.0*  PLT 155 137*   BMET:  Recent Labs    04/04/18 0225  04/04/18 1901 04/04/18 1902 04/05/18 0402  NA 138   < > 138  --  139  K 4.2   < > 5.1  --  4.1  CL 99   < > 104  --  109  CO2 30  --   --   --  25  GLUCOSE 134*   < > 123*  --  110*  BUN 16   < > 19  --  18  CREATININE 1.06   < > 0.90 0.93 0.95  CALCIUM 9.3  --   --   --  8.3*   < > = values in this interval not displayed.     PT/INR:  Recent Labs    04/04/18 1428  LABPROT 17.0*  INR 1.40   ABG    Component Value Date/Time   PHART 7.346 (L) 04/05/2018 0732   HCO3 22.6 04/05/2018 0732   TCO2 24 04/05/2018 0732   ACIDBASEDEF 3.0 (H) 04/05/2018 0732   O2SAT 98.0 04/05/2018 0732   CBG (last 3)  Recent Labs    04/05/18 0408 04/05/18 0504 04/05/18 0604  GLUCAP 111* 99 155*   CXR: left basilar atelectasis  ECG: sinus, no acute changes  Assessment/Plan: S/P Procedure(s) (LRB): CORONARY ARTERY BYPASS GRAFTING (CABG)TIMES 5, USING LEFT  INTERNAL MAMMARY ARTERY AND ENDOSCOPICALLY HARVESTED RIGHT AND LEFT SAPHENOUS VEIN (N/A) TRANSESOPHAGEAL ECHOCARDIOGRAM (TEE) (N/A) ENDOVEIN HARVEST OF GREATER SAPHENOUS VEIN (Right)  POD 1 Hemodynamically stable in sinus rhythm. Normal EF. Continue low dose Lopressor.  Just extubated. Start IS  Volume excess: weight is 20 lbs over preop if accurate. He received a lot of volume overnight due to low filling pressures and neo requirement. Will start diuresis.  Chest tube output decreasing. May be able to remove later today.  DC swan, arterial line.  OOB.  DM: preop Hgb A1c was 6.9. Will give a dose of Levemir and start SSI.    LOS: 6 days    Gaye Pollack 04/05/2018

## 2018-04-05 NOTE — Progress Notes (Signed)
Pt passed all weaning parameters and pulmonary mechanics. Results are as follows:  NIF: -30 FVC: 0.3K  No complications noted. Pt has now met all criteria for extubation. Pt being prepared for extubation at this time.

## 2018-04-05 NOTE — Progress Notes (Signed)
RT attempted to wean patient but patient couldn't initiate own breaths. Sedation was turned off at 2240. Patient wakes up to follow commands, moves all extremities but drifts off to sleep.

## 2018-04-05 NOTE — Progress Notes (Signed)
CT Surgery  Stable day Hb 8.1 this pm  Off neo n nsr

## 2018-04-05 NOTE — Procedures (Signed)
Extubation Procedure Note  Patient Details:   Name: Joseph Mcmillan DOB: 1934-07-06 MRN: [id number]   Airway Documentation:  Airway 8 mm (Active)  Secured at (cm) 23 cm 04/05/2018  5:42 AM  Measured From Lips 04/05/2018  5:42 AM  Secured Location Right 04/05/2018  5:42 AM  Secured By Pink Tape 04/05/2018  5:42 AM  Cuff Pressure (cm H2O) 24 cm H2O 04/04/2018 11:08 PM  Site Condition Dry 04/05/2018  5:42 AM   Vent end date: 04/05/18 Vent end time: 0631   Evaluation  O2 sats: stable throughout Complications: No apparent complications Patient did tolerate procedure well. Bilateral Breath Sounds: Clear   Yes  Pt passed all weaning parameters. Extubation procedure was clearly explained to the patient, pt nodded his head for understanding. Pt has a positive cuff leak. Pt was extubated to a 3L [place] with bubble humidity provided. Patient able to speak and state his name post extubation. No complications noted. Pt is hemodynamically stable at this time.   Leigh Aurora, B.S, RRT, RCP 04/05/2018, 6:36 AM

## 2018-04-06 ENCOUNTER — Inpatient Hospital Stay (HOSPITAL_COMMUNITY): Payer: Medicare Other

## 2018-04-06 LAB — CBC
HCT: 19.1 % — ABNORMAL LOW (ref 39.0–52.0)
HCT: 27 % — ABNORMAL LOW (ref 39.0–52.0)
HCT: 27.5 % — ABNORMAL LOW (ref 39.0–52.0)
HEMOGLOBIN: 6.4 g/dL — AB (ref 13.0–17.0)
HEMOGLOBIN: 9.1 g/dL — AB (ref 13.0–17.0)
Hemoglobin: 8.9 g/dL — ABNORMAL LOW (ref 13.0–17.0)
MCH: 31.8 pg (ref 26.0–34.0)
MCH: 29.9 pg (ref 26.0–34.0)
MCH: 29.7 pg (ref 26.0–34.0)
MCHC: 33.5 g/dL (ref 30.0–36.0)
MCHC: 33 g/dL (ref 30.0–36.0)
MCHC: 33.1 g/dL (ref 30.0–36.0)
MCV: 95 fL (ref 78.0–100.0)
MCV: 90.6 fL (ref 78.0–100.0)
MCV: 89.9 fL (ref 78.0–100.0)
PLATELETS: 101 10*3/uL — AB (ref 150–400)
Platelets: 111 10*3/uL — ABNORMAL LOW (ref 150–400)
Platelets: 98 10*3/uL — ABNORMAL LOW (ref 150–400)
RBC: 2.01 MIL/uL — ABNORMAL LOW (ref 4.22–5.81)
RBC: 2.98 MIL/uL — ABNORMAL LOW (ref 4.22–5.81)
RBC: 3.06 MIL/uL — ABNORMAL LOW (ref 4.22–5.81)
RDW: 13.3 % (ref 11.5–15.5)
RDW: 14.8 % (ref 11.5–15.5)
RDW: 15.4 % (ref 11.5–15.5)
WBC: 11.5 10*3/uL — ABNORMAL HIGH (ref 4.0–10.5)
WBC: 10.5 10*3/uL (ref 4.0–10.5)
WBC: 11.1 10*3/uL — AB (ref 4.0–10.5)

## 2018-04-06 LAB — GLUCOSE, CAPILLARY
GLUCOSE-CAPILLARY: 149 mg/dL — AB (ref 70–99)
GLUCOSE-CAPILLARY: 162 mg/dL — AB (ref 70–99)
GLUCOSE-CAPILLARY: 241 mg/dL — AB (ref 70–99)
Glucose-Capillary: 106 mg/dL — ABNORMAL HIGH (ref 70–99)
Glucose-Capillary: 112 mg/dL — ABNORMAL HIGH (ref 70–99)
Glucose-Capillary: 192 mg/dL — ABNORMAL HIGH (ref 70–99)

## 2018-04-06 LAB — BASIC METABOLIC PANEL
ANION GAP: 5 (ref 5–15)
BUN: 23 mg/dL (ref 8–23)
CO2: 29 mmol/L (ref 22–32)
Calcium: 8 mg/dL — ABNORMAL LOW (ref 8.9–10.3)
Chloride: 104 mmol/L (ref 98–111)
Creatinine, Ser: 1.34 mg/dL — ABNORMAL HIGH (ref 0.61–1.24)
GFR calc Af Amer: 56 mL/min — ABNORMAL LOW (ref >60)
GFR, EST NON AFRICAN AMERICAN: 48 mL/min — AB (ref >60)
GLUCOSE: 114 mg/dL — AB (ref 70–99)
Potassium: 3.8 mmol/L (ref 3.5–5.1)
Sodium: 138 mmol/L (ref 135–145)

## 2018-04-06 LAB — PREPARE RBC (CROSSMATCH)

## 2018-04-06 MED ORDER — POTASSIUM CHLORIDE CRYS ER 20 MEQ PO TBCR
20.0000 meq | EXTENDED_RELEASE_TABLET | Freq: Two times a day (BID) | ORAL | Status: AC
  Administered 2018-04-06 (×2): 20 meq via ORAL
  Filled 2018-04-06 (×2): qty 1

## 2018-04-06 MED ORDER — GLUCERNA SHAKE PO LIQD
237.0000 mL | Freq: Three times a day (TID) | ORAL | Status: DC
  Administered 2018-04-06 – 2018-04-12 (×19): 237 mL via ORAL

## 2018-04-06 MED ORDER — INSULIN ASPART 100 UNIT/ML ~~LOC~~ SOLN
0.0000 [IU] | Freq: Three times a day (TID) | SUBCUTANEOUS | Status: DC
  Administered 2018-04-06 – 2018-04-07 (×5): 4 [IU] via SUBCUTANEOUS
  Administered 2018-04-08: 2 [IU] via SUBCUTANEOUS
  Administered 2018-04-08: 4 [IU] via SUBCUTANEOUS
  Administered 2018-04-08: 2 [IU] via SUBCUTANEOUS
  Administered 2018-04-09 (×3): 4 [IU] via SUBCUTANEOUS
  Administered 2018-04-10 (×2): 2 [IU] via SUBCUTANEOUS
  Administered 2018-04-11: 4 [IU] via SUBCUTANEOUS
  Administered 2018-04-11: 2 [IU] via SUBCUTANEOUS
  Administered 2018-04-11: 4 [IU] via SUBCUTANEOUS
  Administered 2018-04-12 – 2018-04-13 (×4): 2 [IU] via SUBCUTANEOUS
  Administered 2018-04-13: 4 [IU] via SUBCUTANEOUS

## 2018-04-06 MED ORDER — SODIUM CHLORIDE 0.9% IV SOLUTION
Freq: Once | INTRAVENOUS | Status: AC
  Administered 2018-04-06: 03:00:00 via INTRAVENOUS

## 2018-04-06 MED ORDER — FUROSEMIDE 10 MG/ML IJ SOLN
40.0000 mg | Freq: Once | INTRAMUSCULAR | Status: AC
  Administered 2018-04-06: 40 mg via INTRAVENOUS
  Filled 2018-04-06: qty 4

## 2018-04-06 MED ORDER — FE FUMARATE-B12-VIT C-FA-IFC PO CAPS
1.0000 | ORAL_CAPSULE | Freq: Three times a day (TID) | ORAL | Status: DC
  Administered 2018-04-06 – 2018-04-12 (×19): 1 via ORAL
  Filled 2018-04-06 (×17): qty 1

## 2018-04-06 MED FILL — Mannitol IV Soln 20%: INTRAVENOUS | Qty: 500 | Status: AC

## 2018-04-06 MED FILL — Electrolyte-R (PH 7.4) Solution: INTRAVENOUS | Qty: 3000 | Status: AC

## 2018-04-06 MED FILL — Heparin Sodium (Porcine) Inj 1000 Unit/ML: INTRAMUSCULAR | Qty: 10 | Status: AC

## 2018-04-06 MED FILL — Sodium Chloride IV Soln 0.9%: INTRAVENOUS | Qty: 1000 | Status: AC

## 2018-04-06 MED FILL — Lidocaine HCl(Cardiac) IV PF Soln Pref Syr 100 MG/5ML (2%): INTRAVENOUS | Qty: 5 | Status: AC

## 2018-04-06 MED FILL — Sodium Bicarbonate IV Soln 8.4%: INTRAVENOUS | Qty: 100 | Status: AC

## 2018-04-06 NOTE — Progress Notes (Signed)
Nutrition Follow-up  DOCUMENTATION CODES:   Non-severe (moderate) malnutrition in context of acute illness/injury  INTERVENTION:   Glucerna Shake po TID, each supplement provides 220 kcal and 10 grams of protein   NUTRITION DIAGNOSIS:   Moderate Malnutrition related to acute illness, early satiety(progressive angina) as evidenced by mild fat depletion, mild muscle depletion, moderate muscle depletion.  Being addressed via supplements  GOAL:   Patient will meet greater than or equal to 90% of their needs  Progressing   MONITOR:   PO intake, Supplement acceptance, Weight trends, Labs  REASON FOR ASSESSMENT:   Malnutrition Screening Tool    ASSESSMENT:   84 year old male who presented s/p L heart cath and coronary angiography. PMH significant for atherosclerotic vascular disease s/p carotid endarterectomy, hypertension, and hyperlipidemia. Pt has recently been evaluated by cardiology for progressive symptoms of angina. Pt found to have severe 3 vessel CAD with tight calcified LAD stenosis. Pt is awaiting CABG next Tuesday.  8/13 CABG x 5, Intubated 8/14 Extubated  PO intake on Carb Modified diet prior to surgery was 90-100% of meals. Tolerated CL diet post-op and diet has been advanced to Carb Modified today. Pt reports appetite post-op poor. Ate bites at breakfast this AM. Discussed supplementing with oral nutrition supplement until diet advanced; discussed options and pt agreeable to Glucerna shakes.  Weight up post-op as to be expected from fluid gains, starting to trend back down. Admission wt 68.5 kg; weight 65.5 kg day of surgery. Current wt 73.5 kg  Labs: reviewed Meds: reviewed  Diet Order:   Diet Order            Diet Carb Modified Fluid consistency: Thin; Room service appropriate? Yes  Diet effective now              EDUCATION NEEDS:   No education needs have been identified at this time  Skin:  Skin Assessment: Skin Integrity Issues: Skin Integrity  Issues:: Incisions Incisions: leg, chest  Last BM:  8/11  Height:   Ht Readings from Last 1 Encounters:  03/30/18 5\' 9"  (1.753 m)    Weight:   Wt Readings from Last 1 Encounters:  04/06/18 73.5 kg    Ideal Body Weight:  72.73 kg  BMI:  Body mass index is 23.93 kg/m.  Estimated Nutritional Needs:   Kcal:  1725-1950 kcals   Protein:  80-95 grams  Fluid:  >/= 1.7 L   Kerman Passey MS, RD, LDN, CNSC [phone] Pager  [phone] Weekend/On-Call Pager

## 2018-04-06 NOTE — Progress Notes (Signed)
      [address]       [place] [address]             [phone]      POD # 2 CABG x 5  Resting comfortably  BP 132/71   Pulse 97   Temp 97.6 F (36.4 C) (Oral)   Resp (!) 21   Ht 5\' 9"  (1.753 m)   Wt 73.5 kg   SpO2 94%   BMI 23.93 kg/m   Intake/Output Summary (Last 24 hours) at 04/06/2018 1827 Last data filed at 04/06/2018 1500 Gross per 24 hour  Intake 1447 ml  Output 1525 ml  Net -78 ml   CBG mildly elevated  Continue current care  Tashema Tiller C. Roxan Hockey, MD [facility] [phone]

## 2018-04-06 NOTE — Plan of Care (Signed)
  Problem: Health Behavior/Discharge Planning: Goal: Ability to manage health-related needs will improve Outcome: Progressing   Problem: Activity: Goal: Risk for activity intolerance will decrease Outcome: Progressing   Problem: Pain Managment: Goal: General experience of comfort will improve Outcome: Progressing   Problem: Safety: Goal: Ability to remain free from injury will improve Outcome: Progressing   

## 2018-04-06 NOTE — Progress Notes (Signed)
2 Days Post-Op Procedure(s) (LRB): CORONARY ARTERY BYPASS GRAFTING (CABG)TIMES 5, USING LEFT  INTERNAL MAMMARY ARTERY AND ENDOSCOPICALLY HARVESTED RIGHT AND LEFT SAPHENOUS VEIN (N/A) TRANSESOPHAGEAL ECHOCARDIOGRAM (TEE) (N/A) ENDOVEIN HARVEST OF GREATER SAPHENOUS VEIN (Right) Subjective: Did not sleep much. Not much pain.  Transfused 2 units of PRBC's overnight due to hypotension and Hgb dropped to 6.4 at 2 am from 8.1 at 5 pm. He did receive a unit of albumin in the interval. Only about 220 cc out of chest tubes in the interval.  Objective: Vital signs in last 24 hours: Temp:  [97.2 F (36.2 C)-98.6 F (37 C)] 98.6 F (37 C) (08/15 0723) Pulse Rate:  [77-112] 82 (08/15 0700) Cardiac Rhythm: Normal sinus rhythm (08/14 2000) Resp:  [11-21] 14 (08/15 0700) BP: (88-150)/(51-79) 116/62 (08/15 0700) SpO2:  [92 %-100 %] 92 % (08/15 0700) Arterial Line BP: (150-157)/(67-69) 151/67 (08/14 0830) Weight:  [73.5 kg] 73.5 kg (08/15 0600)  Hemodynamic parameters for last 24 hours: PAP: (20-27)/(15-21) 20/15 CO:  [5 L/min] 5 L/min CI:  [2.8 L/min/m2] 2.8 L/min/m2  Intake/Output from previous day: 08/14 0701 - 08/15 0700 In: 2322.1 [P.O.:250; I.V.:475.1; Blood:997; IV Piggyback:600] Out: 2950 [Urine:2345; Chest Tube:605] Intake/Output this shift: No intake/output data recorded.  General appearance: alert and cooperative Neurologic: intact Heart: regular rate and rhythm, S1, S2 normal, no murmur, click, rub or gallop Lungs: diminished breath sounds bibasilar Extremities: edema moderate Wound: dressing dry. Chest tube output minimal this am and thin serosanguinous.  Lab Results: Recent Labs    04/06/18 0220 04/06/18 0652  WBC 11.5* 10.5  HGB 6.4* 8.9*  HCT 19.1* 27.0*  PLT 111* 98*   BMET:  Recent Labs    04/05/18 0402  04/05/18 1652 04/06/18 0220  NA 139  --  138 138  K 4.1  --  3.8 3.8  CL 109  --  99 104  CO2 25  --   --  29  GLUCOSE 110*  --  220* 114*  BUN 18  --   22 23  CREATININE 0.95   < > 1.10 1.34*  CALCIUM 8.3*  --   --  8.0*   < > = values in this interval not displayed.    PT/INR:  Recent Labs    04/04/18 1428  LABPROT 17.0*  INR 1.40   ABG    Component Value Date/Time   PHART 7.346 (L) 04/05/2018 0732   HCO3 22.6 04/05/2018 0732   TCO2 25 04/05/2018 1652   ACIDBASEDEF 3.0 (H) 04/05/2018 0732   O2SAT 98.0 04/05/2018 0732   CBG (last 3)  Recent Labs    04/05/18 2348 04/06/18 0403 04/06/18 0727  GLUCAP 149* 106* 112*   CXR: bibasilar atelectasis. No significant pleural effusion, mediastinal contour normal.  Assessment/Plan: S/P Procedure(s) (LRB): CORONARY ARTERY BYPASS GRAFTING (CABG)TIMES 5, USING LEFT  INTERNAL MAMMARY ARTERY AND ENDOSCOPICALLY HARVESTED RIGHT AND LEFT SAPHENOUS VEIN (N/A) TRANSESOPHAGEAL ECHOCARDIOGRAM (TEE) (N/A) ENDOVEIN HARVEST OF GREATER SAPHENOUS VEIN (Right)  POD 2 Hemodynamically stable this am in sinus rhythm. Continue low dose Lopressor.  Acute postop blood loss anemia: Drop in Hgb overnight may be due to dilution and equilibration. There is not that much chest tube output to account for that drop and the chest tube output is thinner and non-clotting so I suspect it is old blood breaking down and draining. Will keep chest tubes in today to observe. Repeat Hgb this afternoon. Will start iron. Plts dropped so will hold ASA for now until they recover. Plan  ASA and Plavix at discharge.  Bibasilar atelectasis: work on IS, ambulate as tolerated.  DM: glucose under good control on Levemir and SSI.  Volume excess: Continue diuretic. Follow creatinine.       LOS: 7 days    Joseph Mcmillan 04/06/2018

## 2018-04-07 ENCOUNTER — Inpatient Hospital Stay (HOSPITAL_COMMUNITY): Payer: Medicare Other

## 2018-04-07 LAB — GLUCOSE, CAPILLARY
GLUCOSE-CAPILLARY: 161 mg/dL — AB (ref 70–99)
GLUCOSE-CAPILLARY: 165 mg/dL — AB (ref 70–99)
GLUCOSE-CAPILLARY: 199 mg/dL — AB (ref 70–99)
Glucose-Capillary: 179 mg/dL — ABNORMAL HIGH (ref 70–99)
Glucose-Capillary: 164 mg/dL — ABNORMAL HIGH (ref 70–99)

## 2018-04-07 LAB — BASIC METABOLIC PANEL
Anion gap: 5 (ref 5–15)
BUN: 24 mg/dL — ABNORMAL HIGH (ref 8–23)
CALCIUM: 8.1 mg/dL — AB (ref 8.9–10.3)
CO2: 31 mmol/L (ref 22–32)
CREATININE: 1.05 mg/dL (ref 0.61–1.24)
Chloride: 99 mmol/L (ref 98–111)
GFR calc non Af Amer: >60 mL/min (ref >60)
Glucose, Bld: 150 mg/dL — ABNORMAL HIGH (ref 70–99)
Potassium: 4 mmol/L (ref 3.5–5.1)
SODIUM: 135 mmol/L (ref 135–145)

## 2018-04-07 LAB — CBC
HCT: 26.8 % — ABNORMAL LOW (ref 39.0–52.0)
Hemoglobin: 8.8 g/dL — ABNORMAL LOW (ref 13.0–17.0)
MCH: 29.6 pg (ref 26.0–34.0)
MCHC: 32.8 g/dL (ref 30.0–36.0)
MCV: 90.2 fL (ref 78.0–100.0)
Platelets: 101 10*3/uL — ABNORMAL LOW (ref 150–400)
RBC: 2.97 MIL/uL — ABNORMAL LOW (ref 4.22–5.81)
RDW: 15.2 % (ref 11.5–15.5)
WBC: 8.8 10*3/uL (ref 4.0–10.5)

## 2018-04-07 LAB — TYPE AND SCREEN
ABO/RH(D): O POS
Antibody Screen: NEGATIVE
UNIT DIVISION: 0
UNIT DIVISION: 0
Unit division: 0

## 2018-04-07 LAB — BPAM RBC
BLOOD PRODUCT EXPIRATION DATE: 201909082359
BLOOD PRODUCT EXPIRATION DATE: 201909142359
Blood Product Expiration Date: 201909142359
ISSUE DATE / TIME: 201908150314
ISSUE DATE / TIME: 201908150442
ISSUE DATE / TIME: 201908131541
UNIT TYPE AND RH: 5100
Unit Type and Rh: 5100
Unit Type and Rh: 5100

## 2018-04-07 MED ORDER — METOPROLOL TARTRATE 25 MG PO TABS
25.0000 mg | ORAL_TABLET | Freq: Two times a day (BID) | ORAL | Status: DC
  Administered 2018-04-07 – 2018-04-13 (×12): 25 mg via ORAL
  Filled 2018-04-07 (×12): qty 1

## 2018-04-07 MED ORDER — SODIUM CHLORIDE 0.9% FLUSH: 10.0000 mL | INTRAVENOUS | Status: DC | PRN

## 2018-04-07 MED ORDER — METOPROLOL TARTRATE 25 MG PO TABS: 25.0000 mg | ORAL_TABLET | Freq: Two times a day (BID) | ORAL | Status: DC

## 2018-04-07 MED ORDER — METOPROLOL TARTRATE 25 MG/10 ML ORAL SUSPENSION: 12.5000 mg | Freq: Two times a day (BID) | ORAL | Status: DC

## 2018-04-07 MED ORDER — METFORMIN HCL 500 MG PO TABS
500.0000 mg | ORAL_TABLET | Freq: Two times a day (BID) | ORAL | Status: DC
  Administered 2018-04-07 – 2018-04-10 (×7): 500 mg via ORAL
  Filled 2018-04-07 (×7): qty 1

## 2018-04-07 MED ORDER — POTASSIUM CHLORIDE CRYS ER 20 MEQ PO TBCR
20.0000 meq | EXTENDED_RELEASE_TABLET | Freq: Every day | ORAL | Status: AC
  Administered 2018-04-07 – 2018-04-09 (×3): 20 meq via ORAL
  Filled 2018-04-07 (×3): qty 1

## 2018-04-07 MED ORDER — LIVING WELL WITH DIABETES BOOK
Freq: Once | Status: AC
  Administered 2018-04-07: 15:00:00
  Filled 2018-04-07: qty 1

## 2018-04-07 MED ORDER — CHLORHEXIDINE GLUCONATE CLOTH 2 % EX PADS
6.0000 | MEDICATED_PAD | Freq: Every day | CUTANEOUS | Status: DC
  Administered 2018-04-07: 6 via TOPICAL

## 2018-04-07 MED ORDER — SODIUM CHLORIDE 0.9 % IV SOLN
INTRAVENOUS | Status: DC
  Administered 2018-04-07: 10:00:00 via INTRAVENOUS

## 2018-04-07 MED ORDER — FUROSEMIDE 40 MG PO TABS
40.0000 mg | ORAL_TABLET | Freq: Every day | ORAL | Status: DC
  Administered 2018-04-07 – 2018-04-08 (×2): 40 mg via ORAL
  Filled 2018-04-07 (×2): qty 1

## 2018-04-07 NOTE — Progress Notes (Signed)
Inpatient Diabetes Program Recommendations  AACE/ADA: New Consensus Statement on Inpatient Glycemic Control (2015)  Target Ranges:  Prepandial:   less than 140 mg/dL      Peak postprandial:   less than 180 mg/dL (1-2 hours)      Critically ill patients:  140 - 180 mg/dL   Lab Results  Component Value Date   GLUCAP 199 (H) 04/07/2018   HGBA1C 6.9 (H) 04/02/2018     Inpatient Diabetes Program Recommendations:   Spoke with patient briefly regarding elevated A1C.  He states that he has been told previously by his PCP that he was "borderline" and therefore has been watching what he eats and checking blood sugars 3 days a week.  He states that most of the time blood sugars are less than 120 mg/dL at home.  He was concerned about them being elevated in the hospital.  We discussed that this was likely due to stress of surgery and hospitalization.  A1C indicates average blood sugar less than 150 mg/dL which is great for his age.  Discussed that MD has started Metformin 500 mg bid.  Discussed mechanism of action and potential side effects including diarrhea and GI upset.  Encouraged follow-up with PCP and for patient to continue checking blood sugars 3 times a week and to alert MD if blood sugars increase. Patient and wife appreciative of information.  The have "LIving Well with DM" booklet at bedside.    Thanks,  Adah Perl, RN, BC-ADM Inpatient Diabetes Coordinator Pager [phone] (8a-5p)

## 2018-04-07 NOTE — Progress Notes (Signed)
Nutrition Follow-up  DOCUMENTATION CODES:   Non-severe (moderate) malnutrition in context of acute illness/injury  INTERVENTION:   Continue Glucerna Shake po TID, each supplement provides 220 kcal and 10 grams of protein  Follow-up and provide diet education as able. Pt would likely best benefit from Outpatient DM education. Recommend lifestyle   NUTRITION DIAGNOSIS:   Moderate Malnutrition related to acute illness, early satiety(progressive angina) as evidenced by mild fat depletion, mild muscle depletion, moderate muscle depletion.  GOAL:   Patient will meet greater than or equal to 90% of their needs  MONITOR:   PO intake, Supplement acceptance, Weight trends, Labs  REASON FOR ASSESSMENT:   Malnutrition Screening Tool    ASSESSMENT:   84 year old male who presented s/p L heart cath and coronary angiography. PMH significant for atherosclerotic vascular disease s/p carotid endarterectomy, hypertension, and hyperlipidemia. Pt has recently been evaluated by cardiology for progressive symptoms of angina. Pt found to have severe 3 vessel CAD with tight calcified LAD stenosis. Pt is awaiting CABG next Tuesday.  PO intake appears to be improving, 50% of meals. Taking some Glucerna.   Consult received for DM education. Pt POD 3 from CABG x 5; pt would likely benefit more from Outpatient education post discharge; pt will also receive diet education at Cardiac Rehab and DM can be addressed there as well.  Newly dx DM. Lab Results  Component Value Date   HGBA1C 6.9 (H) 04/02/2018   Meds: lasix, metformin, levemir  Diet Order:   Diet Order            Diet Carb Modified Fluid consistency: Thin; Room service appropriate? Yes  Diet effective now              EDUCATION NEEDS:   No education needs have been identified at this time  Skin:  Skin Assessment: Skin Integrity Issues: Skin Integrity Issues:: Incisions Incisions: leg, chest  Last BM:  8/11  Height:   Ht  Readings from Last 1 Encounters:  03/30/18 5\' 9"  (1.753 m)    Weight:   Wt Readings from Last 1 Encounters:  04/07/18 73.8 kg    Ideal Body Weight:  72.73 kg  BMI:  Body mass index is 24.03 kg/m.  Estimated Nutritional Needs:   Kcal:  1725-1950 kcals   Protein:  80-95 grams  Fluid:  >/= 1.7 L   Kerman Passey MS, RD, LDN, CNSC [phone] Pager  [phone] Weekend/On-Call Pager

## 2018-04-07 NOTE — Plan of Care (Signed)
  Problem: Health Behavior/Discharge Planning: Goal: Ability to manage health-related needs will improve Outcome: Progressing   Problem: Clinical Measurements: Goal: Ability to maintain clinical measurements within normal limits will improve Outcome: Progressing Goal: Will remain free from infection Outcome: Progressing Goal: Diagnostic test results will improve Outcome: Progressing Goal: Cardiovascular complication will be avoided Outcome: Progressing   Problem: Activity: Goal: Risk for activity intolerance will decrease Outcome: Progressing   Problem: Elimination: Goal: Will not experience complications related to bowel motility Outcome: Progressing   Problem: Pain Managment: Goal: General experience of comfort will improve Outcome: Progressing   Problem: Safety: Goal: Ability to remain free from injury will improve Outcome: Progressing   Problem: Skin Integrity: Goal: Risk for impaired skin integrity will decrease Outcome: Progressing

## 2018-04-07 NOTE — Progress Notes (Signed)
T[facility] DAILY ICU PROGRESS NOTE                   [address]            [place] [address]          [phone]   3 Days Post-Op Procedure(s) (LRB): CORONARY ARTERY BYPASS GRAFTING (CABG)TIMES 5, USING LEFT  INTERNAL MAMMARY ARTERY AND ENDOSCOPICALLY HARVESTED RIGHT AND LEFT SAPHENOUS VEIN (N/A) TRANSESOPHAGEAL ECHOCARDIOGRAM (TEE) (N/A) ENDOVEIN HARVEST OF GREATER SAPHENOUS VEIN (Right)  Total Length of Stay:  LOS: 8 days   Subjective: Has had a couple episodes of afib, resolved with doses of lopressor Moderate CT drainage yesterday, decreased today  Objective: Vital signs in last 24 hours: Temp:  [97.6 F (36.4 C)-99 F (37.2 C)] 98.1 F (36.7 C) (08/16 1215) Pulse Rate:  [77-128] 85 (08/16 1300) Cardiac Rhythm: Atrial fibrillation (08/16 0800) Resp:  [13-23] 17 (08/16 1300) BP: (113-153)/(59-94) 153/81 (08/16 1300) SpO2:  [89 %-100 %] 100 % (08/16 1300) Weight:  [73.8 kg] 73.8 kg (08/16 0500)  Filed Weights   04/05/18 0600 04/06/18 0600 04/07/18 0500  Weight: 74.6 kg 73.5 kg 73.8 kg    Weight change: 0.3 kg   Hemodynamic parameters for last 24 hours:    Intake/Output from previous day: 08/15 0701 - 08/16 0700 In: 764 [P.O.:654; I.V.:110] Out: 1335 [Urine:1075; Chest Tube:260]  Intake/Output this shift: Total I/O In: 148.7 [P.O.:120; I.V.:28.7] Out: 390 [Urine:350; Chest Tube:40]  Current Meds: Scheduled Meds: . acetaminophen  1,000 mg Oral Q6H   Or  . acetaminophen (TYLENOL) oral liquid 160 mg/5 mL  1,000 mg Per Tube Q6H  . bisacodyl  10 mg Oral Daily   Or  . bisacodyl  10 mg Rectal Daily  . Chlorhexidine Gluconate Cloth  6 each Topical Daily  . docusate sodium  200 mg Oral Daily  . feeding supplement (GLUCERNA SHAKE)  237 mL Oral TID BM  . ferrous [id number] C-folic acid  1 capsule Oral TID PC  . insulin aspart  0-24 Units Subcutaneous TID WC  . insulin detemir  10 Units Subcutaneous Daily  . linaclotide  72 mcg Oral QAC  breakfast  . metoprolol tartrate  12.5 mg Oral BID   Or  . metoprolol tartrate  12.5 mg Per Tube BID  . pantoprazole  40 mg Oral Daily  . simvastatin  40 mg Oral QHS  . sodium chloride flush  3 mL Intravenous Q12H   Continuous Infusions: . sodium chloride Stopped (04/05/18 0837)  . sodium chloride 10 mL/hr at 04/07/18 1300  . nitroGLYCERIN Stopped (04/05/18 4081)  . phenylephrine (NEO-SYNEPHRINE) Adult infusion Stopped (04/05/18 0737)   PRN Meds:.sodium chloride, fentaNYL (SUBLIMAZE) injection, metoprolol tartrate, ondansetron (ZOFRAN) IV, sodium chloride flush, sodium chloride flush, traMADol  General appearance: alert, cooperative, appears stated age and no distress Heart: regular rate and rhythm Lungs: mildly dim in bases Abdomen: benign Extremities: min edema Wound: incis healing well  Lab Results: CBC: Recent Labs    04/06/18 1448 04/07/18 0318  WBC 11.1* 8.8  HGB 9.1* 8.8*  HCT 27.5* 26.8*  PLT 101* 101*   BMET:  Recent Labs    04/06/18 0220 04/07/18 0318  NA 138 135  K 3.8 4.0  CL 104 99  CO2 29 31  GLUCOSE 114* 150*  BUN 23 24*  CREATININE 1.34* 1.05  CALCIUM 8.0* 8.1*    CMET: Lab Results  Component Value Date   WBC 8.8 04/07/2018   HGB 8.8 (  L) 04/07/2018   HCT 26.8 (L) 04/07/2018   PLT 101 (L) 04/07/2018   GLUCOSE 150 (H) 04/07/2018   CHOL 166 03/31/2018   TRIG 97 03/31/2018   HDL 63 03/31/2018   LDLCALC 84 03/31/2018   ALT 20 03/05/2010   AST 26 03/05/2010   NA 135 04/07/2018   K 4.0 04/07/2018   CL 99 04/07/2018   CREATININE 1.05 04/07/2018   BUN 24 (H) 04/07/2018   CO2 31 04/07/2018   INR 1.40 04/04/2018   HGBA1C 6.9 (H) 04/02/2018      PT/INR:  Recent Labs    04/04/18 1428  LABPROT 17.0*  INR 1.40   Radiology: Dg Chest Port 1 View  Result Date: 04/07/2018 CLINICAL DATA:  84 year old male postoperative day 3 status post CABG. EXAM: PORTABLE CHEST 1 VIEW COMPARISON:  04/06/2018 and earlier. FINDINGS: Portable AP upright  view at 0628 hours. Right chest tube, mediastinal tube, and right IJ introducer sheath remain. Stable low lung volumes. No pneumothorax or pulmonary edema. Left greater than right lung base opacity likely representing combination of pleural effusion and atelectasis. Stable cardiac size and mediastinal contours. Sequelae of CABG. Stable ventilation. Negative visible bowel gas pattern. IMPRESSION: 1. Stable ventilation since yesterday, stable lines and tubes. 2. Lung base combined pleural effusion and atelectasis suspected. Electronically Signed   By: Genevie Ann M.D.   On: 04/07/2018 09:09     Assessment/Plan: S/P Procedure(s) (LRB): CORONARY ARTERY BYPASS GRAFTING (CABG)TIMES 5, USING LEFT  INTERNAL MAMMARY ARTERY AND ENDOSCOPICALLY HARVESTED RIGHT AND LEFT SAPHENOUS VEIN (N/A) TRANSESOPHAGEAL ECHOCARDIOGRAM (TEE) (N/A) ENDOVEIN HARVEST OF GREATER SAPHENOUS VEIN (Right)   1 doing well 2 will d/c tubes 3 increase lopressor dose, may need to initiate amiodarone if afib recurs. K= is 4.0. Most recent Mg++ok 4 he is a diabetic - will need teaching , start metformin 5 H/H is stable 6 renal fxn is ok. Will diurese a little for some volume overload 7 routine pulm toilet and cardiac rehab 8 CXR minor effus/atx- follow , diurese some   John Giovanni 04/07/2018 1:09 PM  Pager [phone]

## 2018-04-08 ENCOUNTER — Inpatient Hospital Stay (HOSPITAL_COMMUNITY): Payer: Medicare Other

## 2018-04-08 LAB — CBC
HCT: 27.2 % — ABNORMAL LOW (ref 39.0–52.0)
Hemoglobin: 8.8 g/dL — ABNORMAL LOW (ref 13.0–17.0)
MCH: 29.6 pg (ref 26.0–34.0)
MCHC: 32.4 g/dL (ref 30.0–36.0)
MCV: 91.6 fL (ref 78.0–100.0)
Platelets: 120 10*3/uL — ABNORMAL LOW (ref 150–400)
RBC: 2.97 MIL/uL — ABNORMAL LOW (ref 4.22–5.81)
RDW: 14.6 % (ref 11.5–15.5)
WBC: 8.7 10*3/uL (ref 4.0–10.5)

## 2018-04-08 LAB — BASIC METABOLIC PANEL
Anion gap: 7 (ref 5–15)
BUN: 23 mg/dL (ref 8–23)
CO2: 32 mmol/L (ref 22–32)
Calcium: 8.4 mg/dL — ABNORMAL LOW (ref 8.9–10.3)
Chloride: 98 mmol/L (ref 98–111)
Creatinine, Ser: 0.94 mg/dL (ref 0.61–1.24)
GFR calc Af Amer: >60 mL/min (ref >60)
GLUCOSE: 140 mg/dL — AB (ref 70–99)
POTASSIUM: 3.7 mmol/L (ref 3.5–5.1)
Sodium: 137 mmol/L (ref 135–145)

## 2018-04-08 LAB — GLUCOSE, CAPILLARY
Glucose-Capillary: 137 mg/dL — ABNORMAL HIGH (ref 70–99)
Glucose-Capillary: 148 mg/dL — ABNORMAL HIGH (ref 70–99)
Glucose-Capillary: 193 mg/dL — ABNORMAL HIGH (ref 70–99)

## 2018-04-08 MED ORDER — AMIODARONE HCL IN DEXTROSE 360-4.14 MG/200ML-% IV SOLN
30.0000 mg/h | INTRAVENOUS | Status: AC
  Administered 2018-04-08 – 2018-04-09 (×2): 30 mg/h via INTRAVENOUS
  Filled 2018-04-08: qty 200

## 2018-04-08 MED ORDER — MOVING RIGHT ALONG BOOK
Freq: Once | Status: AC
  Administered 2018-04-08: 12:00:00
  Filled 2018-04-08: qty 1

## 2018-04-08 MED ORDER — AMIODARONE HCL IN DEXTROSE 360-4.14 MG/200ML-% IV SOLN
60.0000 mg/h | INTRAVENOUS | Status: AC
  Administered 2018-04-08 (×2): 60 mg/h via INTRAVENOUS

## 2018-04-08 MED ORDER — FUROSEMIDE 10 MG/ML IJ SOLN
40.0000 mg | Freq: Once | INTRAMUSCULAR | Status: AC
  Administered 2018-04-08: 40 mg via INTRAVENOUS
  Filled 2018-04-08: qty 4

## 2018-04-08 MED ORDER — LISINOPRIL 10 MG PO TABS
10.0000 mg | ORAL_TABLET | Freq: Every day | ORAL | Status: DC
  Administered 2018-04-08 – 2018-04-10 (×3): 10 mg via ORAL
  Filled 2018-04-08 (×3): qty 1

## 2018-04-08 MED ORDER — LABETALOL HCL 5 MG/ML IV SOLN
10.0000 mg | INTRAVENOUS | Status: DC | PRN
  Administered 2018-04-09: 10 mg via INTRAVENOUS
  Filled 2018-04-08 (×2): qty 4

## 2018-04-08 MED ORDER — SODIUM CHLORIDE 0.9% FLUSH
3.0000 mL | Freq: Two times a day (BID) | INTRAVENOUS | Status: DC
  Administered 2018-04-09 – 2018-04-13 (×8): 3 mL via INTRAVENOUS

## 2018-04-08 MED ORDER — SODIUM CHLORIDE 0.9% FLUSH: 3.0000 mL | INTRAVENOUS | Status: DC | PRN

## 2018-04-08 MED ORDER — ENOXAPARIN SODIUM 30 MG/0.3ML ~~LOC~~ SOLN
30.0000 mg | SUBCUTANEOUS | Status: DC
  Administered 2018-04-08 – 2018-04-12 (×5): 30 mg via SUBCUTANEOUS
  Filled 2018-04-08 (×5): qty 0.3

## 2018-04-08 MED ORDER — SODIUM CHLORIDE 0.9 % IV SOLN: 250.0000 mL | INTRAVENOUS | Status: DC | PRN

## 2018-04-08 MED ORDER — AMIODARONE LOAD VIA INFUSION
150.0000 mg | Freq: Once | INTRAVENOUS | Status: AC
  Administered 2018-04-08: 150 mg via INTRAVENOUS
  Filled 2018-04-08: qty 83.34

## 2018-04-08 MED ORDER — AMIODARONE HCL IN DEXTROSE 360-4.14 MG/200ML-% IV SOLN
INTRAVENOUS | Status: AC
  Filled 2018-04-08: qty 400

## 2018-04-08 MED ORDER — FUROSEMIDE 40 MG PO TABS
40.0000 mg | ORAL_TABLET | Freq: Every day | ORAL | Status: DC
  Administered 2018-04-09 – 2018-04-10 (×2): 40 mg via ORAL
  Filled 2018-04-08 (×2): qty 1

## 2018-04-08 NOTE — Progress Notes (Signed)
[address]       [place] [address]             [phone]        CARDIOTHORACIC SURGERY PROGRESS NOTE   R4 Days Post-Op Procedure(s) (LRB): CORONARY ARTERY BYPASS GRAFTING (CABG)TIMES 5, USING LEFT  INTERNAL MAMMARY ARTERY AND ENDOSCOPICALLY HARVESTED RIGHT AND LEFT SAPHENOUS VEIN (N/A) TRANSESOPHAGEAL ECHOCARDIOGRAM (TEE) (N/A) ENDOVEIN HARVEST OF GREATER SAPHENOUS VEIN (Right)  Subjective: No specific complaints.  Denies pain, SOB.  Eating some.  Ambulating well.  Didn't sleep last night  Objective: Vital signs: BP Readings from Last 1 Encounters:  04/08/18 (!) 160/81   Pulse Readings from Last 1 Encounters:  04/08/18 88   Resp Readings from Last 1 Encounters:  04/08/18 (!) 22   Temp Readings from Last 1 Encounters:  04/08/18 99 F (37.2 C) (Oral)    Hemodynamics:    Physical Exam:  Rhythm:   sinus  Breath sounds: clear  Heart sounds:  RRR  Incisions:  Clean and dry  Abdomen:  Soft, non-distended, non-tender  Extremities:  Warm, well-perfused    Intake/Output from previous day: 08/16 0701 - 08/17 0700 In: 570.2 [P.O.:360; I.V.:210.2] Out: 940 [Urine:900; Chest Tube:40] Intake/Output this shift: Total I/O In: 280 [P.O.:240; I.V.:40] Out: 625 [Urine:625]  Lab Results:  CBC: Recent Labs    04/07/18 0318 04/08/18 0548  WBC 8.8 8.7  HGB 8.8* 8.8*  HCT 26.8* 27.2*  PLT 101* 120*    BMET:  Recent Labs    04/07/18 0318 04/08/18 0548  NA 135 137  K 4.0 3.7  CL 99 98  CO2 31 32  GLUCOSE 150* 140*  BUN 24* 23  CREATININE 1.05 0.94  CALCIUM 8.1* 8.4*     PT/INR:  No results for input(s): LABPROT, INR in the last 72 hours.  CBG (last 3)  Recent Labs    04/07/18 1647 04/07/18 2156 04/08/18 0721  GLUCAP 179* 164* 137*    ABG    Component Value Date/Time   PHART 7.346 (L) 04/05/2018 0732   PCO2ART 40.9 04/05/2018 0732   PO2ART 102.0 04/05/2018 0732   HCO3 22.6 04/05/2018 0732   TCO2 25 04/05/2018 1652   ACIDBASEDEF 3.0 (H) 04/05/2018 0732   O2SAT 98.0 04/05/2018 0732    CXR: PORTABLE CHEST 1 VIEW  COMPARISON:  04/07/2018  FINDINGS: Cardiac shadow is stable. Thoracostomy catheter on the left and mediastinal drain have been removed. Right jugular sheath remains in place. The right lung is clear. Left basilar atelectasis and small effusion are again noted and stable. No pneumothorax is seen. No other focal abnormality is noted.  IMPRESSION: No pneumothorax following chest tube removal.  Persistent left basilar atelectasis and effusion are noted.   Electronically Signed   By: Inez Catalina M.D.   On: 04/08/2018 08:09  Assessment/Plan: S/P Procedure(s) (LRB): CORONARY ARTERY BYPASS GRAFTING (CABG)TIMES 5, USING LEFT  INTERNAL MAMMARY ARTERY AND ENDOSCOPICALLY HARVESTED RIGHT AND LEFT SAPHENOUS VEIN (N/A) TRANSESOPHAGEAL ECHOCARDIOGRAM (TEE) (N/A) ENDOVEIN HARVEST OF GREATER SAPHENOUS VEIN (Right)  Overall stable POD4 Maintaining NSR w/ stable BP although BP running high Breathing comfortably w/ O2 sats 100% on 2 L/min, CXR clear Expected post op acute blood loss anemia, stable since transfusion Expected post op volume excess, weight reportedly 10 lbs > preop, UOP adequate Type II diabetes mellitus, adequate glycemic control   Mobilize  Diuresis  Continue metoprolol and add lisinopril  Transfer step down   Rexene Alberts, MD 04/08/2018  11:10 AM

## 2018-04-08 NOTE — Progress Notes (Signed)
T[facility] BRIEF SICU PROGRESS NOTE  4 Days Post-Op  S/P Procedure(s) (LRB): CORONARY ARTERY BYPASS GRAFTING (CABG)TIMES 5, USING LEFT  INTERNAL MAMMARY ARTERY AND ENDOSCOPICALLY HARVESTED RIGHT AND LEFT SAPHENOUS VEIN (N/A) TRANSESOPHAGEAL ECHOCARDIOGRAM (TEE) (N/A) ENDOVEIN HARVEST OF GREATER SAPHENOUS VEIN (Right)   Episode post op Afib earlier today, now back in NSR on IV amiodarone BP stable Breathing comfortably  Plan: Continue current plan  Rexene Alberts, MD 04/08/2018 5:51 PM

## 2018-04-08 NOTE — Plan of Care (Signed)
  Problem: Health Behavior/Discharge Planning: Goal: Ability to manage health-related needs will improve Outcome: Progressing   Problem: Clinical Measurements: Goal: Ability to maintain clinical measurements within normal limits will improve Outcome: Progressing Goal: Will remain free from infection Outcome: Progressing Goal: Diagnostic test results will improve Outcome: Progressing Goal: Cardiovascular complication will be avoided Outcome: Progressing   Problem: Activity: Goal: Risk for activity intolerance will decrease Outcome: Progressing   Problem: Elimination: Goal: Will not experience complications related to bowel motility Outcome: Progressing   Problem: Pain Managment: Goal: General experience of comfort will improve Outcome: Progressing   Problem: Safety: Goal: Ability to remain free from injury will improve Outcome: Progressing   Problem: Skin Integrity: Goal: Risk for impaired skin integrity will decrease Outcome: Progressing

## 2018-04-09 ENCOUNTER — Inpatient Hospital Stay (HOSPITAL_COMMUNITY): Payer: Medicare Other

## 2018-04-09 LAB — BASIC METABOLIC PANEL
Anion gap: 8 (ref 5–15)
BUN: 22 mg/dL (ref 8–23)
CALCIUM: 8.6 mg/dL — AB (ref 8.9–10.3)
CO2: 34 mmol/L — AB (ref 22–32)
CREATININE: 1.05 mg/dL (ref 0.61–1.24)
Chloride: 95 mmol/L — ABNORMAL LOW (ref 98–111)
GFR calc Af Amer: >60 mL/min (ref >60)
GFR calc non Af Amer: >60 mL/min (ref >60)
GLUCOSE: 111 mg/dL — AB (ref 70–99)
Potassium: 3.6 mmol/L (ref 3.5–5.1)
Sodium: 137 mmol/L (ref 135–145)

## 2018-04-09 LAB — CBC
HEMATOCRIT: 29.6 % — AB (ref 39.0–52.0)
Hemoglobin: 9.7 g/dL — ABNORMAL LOW (ref 13.0–17.0)
MCH: 29.9 pg (ref 26.0–34.0)
MCHC: 32.8 g/dL (ref 30.0–36.0)
MCV: 91.4 fL (ref 78.0–100.0)
Platelets: 169 10*3/uL (ref 150–400)
RBC: 3.24 MIL/uL — ABNORMAL LOW (ref 4.22–5.81)
RDW: 14.3 % (ref 11.5–15.5)
WBC: 9.5 10*3/uL (ref 4.0–10.5)

## 2018-04-09 LAB — GLUCOSE, CAPILLARY
GLUCOSE-CAPILLARY: 170 mg/dL — AB (ref 70–99)
Glucose-Capillary: 177 mg/dL — ABNORMAL HIGH (ref 70–99)
Glucose-Capillary: 184 mg/dL — ABNORMAL HIGH (ref 70–99)

## 2018-04-09 MED ORDER — AMIODARONE HCL 200 MG PO TABS
200.0000 mg | ORAL_TABLET | Freq: Two times a day (BID) | ORAL | Status: DC
  Administered 2018-04-09 – 2018-04-12 (×7): 200 mg via ORAL
  Filled 2018-04-09 (×7): qty 1

## 2018-04-09 NOTE — Progress Notes (Signed)
      [address]       [place] [address]             [phone]        CARDIOTHORACIC SURGERY PROGRESS NOTE   R5 Days Post-Op Procedure(s) (LRB): CORONARY ARTERY BYPASS GRAFTING (CABG)TIMES 5, USING LEFT  INTERNAL MAMMARY ARTERY AND ENDOSCOPICALLY HARVESTED RIGHT AND LEFT SAPHENOUS VEIN (N/A) TRANSESOPHAGEAL ECHOCARDIOGRAM (TEE) (N/A) ENDOVEIN HARVEST OF GREATER SAPHENOUS VEIN (Right)  Subjective: Looks and feels well today.  No pain, SOB.  Ate breakfast.  Objective: Vital signs: BP Readings from Last 1 Encounters:  04/09/18 129/65   Pulse Readings from Last 1 Encounters:  04/09/18 79   Resp Readings from Last 1 Encounters:  04/09/18 15   Temp Readings from Last 1 Encounters:  04/09/18 98.5 F (36.9 C) (Oral)    Hemodynamics:    Physical Exam:  Rhythm:   sinus  Breath sounds: clear  Heart sounds:  RRR  Incisions:  Clean and dry  Abdomen:  Soft, non-distended, non-tender  Extremities:  Warm, well-perfused   Intake/Output from previous day: 08/17 0701 - 08/18 0700 In: 1173.5 [P.O.:720; I.V.:453.5] Out: 2625 [Urine:2625] Intake/Output this shift: Total I/O In: 290.1 [P.O.:240; I.V.:50.1] Out: 175 [Urine:175]  Lab Results:  CBC: Recent Labs    04/08/18 0548 04/09/18 0212  WBC 8.7 9.5  HGB 8.8* 9.7*  HCT 27.2* 29.6*  PLT 120* 169    BMET:  Recent Labs    04/08/18 0548 04/09/18 0212  NA 137 137  K 3.7 3.6  CL 98 95*  CO2 32 34*  GLUCOSE 140* 111*  BUN 23 22  CREATININE 0.94 1.05  CALCIUM 8.4* 8.6*     PT/INR:  No results for input(s): LABPROT, INR in the last 72 hours.  CBG (last 3)  Recent Labs    04/08/18 1136 04/08/18 1611 04/09/18 0848  GLUCAP 148* 193* 177*    ABG    Component Value Date/Time   PHART 7.346 (L) 04/05/2018 0732   PCO2ART 40.9 04/05/2018 0732   PO2ART 102.0 04/05/2018 0732   HCO3 22.6 04/05/2018 0732   TCO2 25 04/05/2018 1652   ACIDBASEDEF 3.0 (H) 04/05/2018 0732   O2SAT 98.0  04/05/2018 0732    CXR: CHEST - 2 VIEW  COMPARISON:  04/08/2018  FINDINGS: Right jugular central venous catheter has been removed. Heart size is stable with post CABG changes. Bibasilar chest densities are compatible with small effusions and basilar atelectasis. Negative for a pneumothorax. Negative for pulmonary edema. Epicardial pacer wires are present.  IMPRESSION: Bibasilar densities are most compatible with small effusions and atelectasis.   Electronically Signed   By: Markus Daft M.D.   On: 04/09/2018 08:01   Assessment/Plan: S/P Procedure(s) (LRB): CORONARY ARTERY BYPASS GRAFTING (CABG)TIMES 5, USING LEFT  INTERNAL MAMMARY ARTERY AND ENDOSCOPICALLY HARVESTED RIGHT AND LEFT SAPHENOUS VEIN (N/A) TRANSESOPHAGEAL ECHOCARDIOGRAM (TEE) (N/A) ENDOVEIN HARVEST OF GREATER SAPHENOUS VEIN (Right)  Overall stable POD5 Maintaining NSR w/ stable BP, hypertension improved Post-op Afib, currently maintaining NSR on IV amiodarone Breathing comfortably w/ O2 sats 94-97% on room air, CXR clear Expected post op acute blood loss anemia, stable since transfusion Expected post op volume excess, weight reportedly 10 lbs > preop, UOP adequate Type II diabetes mellitus, adequate glycemic control   Mobilize  Diuresis  Convert amiodarone to oral  Continue metoprolol, lisinopril  Transfer step down   Rexene Alberts, MD 04/09/2018 11:23 AM

## 2018-04-09 NOTE — Progress Notes (Signed)
[facility] BRIEF SICU PROGRESS NOTE  5 Days Post-Op  S/P Procedure(s) (LRB): CORONARY ARTERY BYPASS GRAFTING (CABG)TIMES 5, USING LEFT  INTERNAL MAMMARY ARTERY AND ENDOSCOPICALLY HARVESTED RIGHT AND LEFT SAPHENOUS VEIN (N/A) TRANSESOPHAGEAL ECHOCARDIOGRAM (TEE) (N/A) ENDOVEIN HARVEST OF GREATER SAPHENOUS VEIN (Right)   Stable day  Plan: Awaiting bed for transfer  Rexene Alberts, MD 04/09/2018 4:45 PM

## 2018-04-10 DIAGNOSIS — E440 Moderate protein-calorie malnutrition: Secondary | ICD-10-CM

## 2018-04-10 HISTORY — DX: Moderate protein-calorie malnutrition: E44.0

## 2018-04-10 LAB — GLUCOSE, CAPILLARY
Glucose-Capillary: 138 mg/dL — ABNORMAL HIGH (ref 70–99)
Glucose-Capillary: 128 mg/dL — ABNORMAL HIGH (ref 70–99)
Glucose-Capillary: 184 mg/dL — ABNORMAL HIGH (ref 70–99)

## 2018-04-10 MED ORDER — ATORVASTATIN CALCIUM 80 MG PO TABS
80.0000 mg | ORAL_TABLET | Freq: Every day | ORAL | Status: DC
  Administered 2018-04-10 – 2018-04-12 (×3): 80 mg via ORAL
  Filled 2018-04-10 (×3): qty 1

## 2018-04-10 MED ORDER — POTASSIUM CHLORIDE CRYS ER 20 MEQ PO TBCR
20.0000 meq | EXTENDED_RELEASE_TABLET | Freq: Two times a day (BID) | ORAL | Status: DC
  Administered 2018-04-10 (×2): 20 meq via ORAL
  Filled 2018-04-10 (×2): qty 1

## 2018-04-10 MED ORDER — ASPIRIN EC 325 MG PO TBEC
325.0000 mg | DELAYED_RELEASE_TABLET | Freq: Every day | ORAL | Status: DC
  Administered 2018-04-10 – 2018-04-13 (×4): 325 mg via ORAL
  Filled 2018-04-10 (×4): qty 1

## 2018-04-10 MED ORDER — TAMSULOSIN HCL 0.4 MG PO CAPS
0.4000 mg | ORAL_CAPSULE | Freq: Every day | ORAL | Status: DC
  Administered 2018-04-10 – 2018-04-11 (×2): 0.4 mg via ORAL
  Filled 2018-04-10 (×2): qty 1

## 2018-04-10 MED ORDER — DIPHENHYDRAMINE HCL 25 MG PO CAPS
25.0000 mg | ORAL_CAPSULE | Freq: Once | ORAL | Status: AC
  Administered 2018-04-10: 25 mg via ORAL
  Filled 2018-04-10: qty 1

## 2018-04-10 NOTE — Progress Notes (Signed)
6 Days Post-Op Procedure(s) (LRB): CORONARY ARTERY BYPASS GRAFTING (CABG)TIMES 5, USING LEFT  INTERNAL MAMMARY ARTERY AND ENDOSCOPICALLY HARVESTED RIGHT AND LEFT SAPHENOUS VEIN (N/A) TRANSESOPHAGEAL ECHOCARDIOGRAM (TEE) (N/A) ENDOVEIN HARVEST OF GREATER SAPHENOUS VEIN (Right) Subjective: No complaints. Feels like he is about ready to go home.  Ambulated this am.  Nurse reports he is urinating small amounts multiple times. Questions BPH.  Objective: Vital signs in last 24 hours: Temp:  [98.5 F (36.9 C)-99.4 F (37.4 C)] 99 F (37.2 C) (08/19 0408) Pulse Rate:  [70-120] 82 (08/19 0600) Cardiac Rhythm: Normal sinus rhythm (08/19 0430) Resp:  [14-26] 22 (08/19 0600) BP: (102-171)/(54-83) 143/75 (08/19 0500) SpO2:  [90 %-100 %] 98 % (08/19 0600) Weight:  [69 kg] 69 kg (08/19 0630)  Hemodynamic parameters for last 24 hours:    Intake/Output from previous day: 08/18 0701 - 08/19 0700 In: 563.5 [P.O.:480; I.V.:83.5] Out: 1726 [Urine:1725; Stool:1] Intake/Output this shift: No intake/output data recorded.  General appearance: alert and cooperative Neurologic: intact Heart: regular rate and rhythm, S1, S2 normal, no murmur, click, rub or gallop Lungs: clear to auscultation bilaterally Extremities: extremities normal, atraumatic, no cyanosis or edema Wound: incisions ok.  Lab Results: Recent Labs    04/08/18 0548 04/09/18 0212  WBC 8.7 9.5  HGB 8.8* 9.7*  HCT 27.2* 29.6*  PLT 120* 169   BMET:  Recent Labs    04/08/18 0548 04/09/18 0212  NA 137 137  K 3.7 3.6  CL 98 95*  CO2 32 34*  GLUCOSE 140* 111*  BUN 23 22  CREATININE 0.94 1.05  CALCIUM 8.4* 8.6*    PT/INR: No results for input(s): LABPROT, INR in the last 72 hours. ABG    Component Value Date/Time   PHART 7.346 (L) 04/05/2018 0732   HCO3 22.6 04/05/2018 0732   TCO2 25 04/05/2018 1652   ACIDBASEDEF 3.0 (H) 04/05/2018 0732   O2SAT 98.0 04/05/2018 0732   CBG (last 3)  Recent Labs    04/09/18 1116  04/09/18 1623 04/10/18 0638  GLUCAP 170* 184* 138*    Assessment/Plan: S/P Procedure(s) (LRB): CORONARY ARTERY BYPASS GRAFTING (CABG)TIMES 5, USING LEFT  INTERNAL MAMMARY ARTERY AND ENDOSCOPICALLY HARVESTED RIGHT AND LEFT SAPHENOUS VEIN (N/A) TRANSESOPHAGEAL ECHOCARDIOGRAM (TEE) (N/A) ENDOVEIN HARVEST OF GREATER SAPHENOUS VEIN (Right)  POD 6 Awaiting bed on 4E. Hemodynamically stable in sinus rhythm on Lopressor and Lisinopril.  Postop atrial fib converted on amio. Maintaining sinus on oral amio DM: glucose under adequate control on Metformin. Preop Hgb A1c was 6.9 on no meds. Will need attention to diet. Expected acute blood loss anemia: stable. Continue iron supplement. Atelectasis on CXR yesterday. Continue IS, ambulation DC pacing wires today. Resume ASA today, resume Plavix on discharge. Plan home tomorrow.   LOS: 11 days    Gaye Pollack 04/10/2018

## 2018-04-10 NOTE — Care Management (Signed)
04-10-18 1628 CM received referral for DME RW: CM spoke with wife in regards to DME. Per wife PTA pt has been independent and will only need DME RW. CM did discuss agencies for DME- wife chose [facility]. Referral sent to [facility] with [facility] and will deliver to room prior to transition home. No home needs identified at this time. Bethena Roys, RN,BSN, CCM Case Manager [phone]

## 2018-04-10 NOTE — Progress Notes (Signed)
Removed pacing wires per order. VSS during removal. Pressure dressing applied. No bleeding noted. Will continue to monitor patient closely.

## 2018-04-10 NOTE — Discharge Instructions (Signed)
Endoscopic Saphenous Vein Harvesting, Care After °Refer to this sheet in the next few weeks. These instructions provide you with information about caring for yourself after your procedure. Your health care provider may also give you more specific instructions. Your treatment has been planned according to current medical practices, but problems sometimes occur. Call your health care provider if you have any problems or questions after your procedure. °What can I expect after the procedure? °After the procedure, it is common to have: °· Pain. °· Bruising. °· Swelling. °· Numbness. ° °Follow these instructions at home: °Medicine °· Take over-the-counter and prescription medicines only as told by your health care provider. °· Do not drive or operate heavy machinery while taking prescription pain medicine. °Incision care ° °· Follow instructions from your health care provider about how to take care of the cut made during surgery (incision). Make sure you: °? Wash your hands with soap and water before you change your bandage (dressing). If soap and water are not available, use hand sanitizer. °? Change your dressing as told by your health care provider. °? Leave stitches (sutures), skin glue, or adhesive strips in place. These skin closures may need to be in place for 2 weeks or longer. If adhesive strip edges start to loosen and curl up, you may trim the loose edges. Do not remove adhesive strips completely unless your health care provider tells you to do that. °· Check your incision area every day for signs of infection. Check for: °? More redness, swelling, or pain. °? More fluid or blood. °? Warmth. °? Pus or a bad smell. °General instructions °· Raise (elevate) your legs above the level of your heart while you are sitting or lying down. °· Do any exercises your health care providers have given you. These may include deep breathing, coughing, and walking exercises. °· Do not shower, take baths, swim, or use a hot tub  unless told by your health care provider. °· Wear your elastic stocking if told by your health care provider. °· Keep all follow-up visits as told by your health care provider. This is important. °Contact a health care provider if: °· Medicine does not help your pain. °· Your pain gets worse. °· You have new leg bruises or your leg bruises get bigger. °· You have a fever. °· Your leg feels numb. °· You have more redness, swelling, or pain around your incision. °· You have more fluid or blood coming from your incision. °· Your incision feels warm to the touch. °· You have pus or a bad smell coming from your incision. °Get help right away if: °· Your pain is severe. °· You develop pain, tenderness, warmth, redness, or swelling in any part of your leg. °· You have chest pain. °· You have trouble breathing. °This information is not intended to replace advice given to you by your health care provider. Make sure you discuss any questions you have with your health care provider. °Document Released: 04/21/2011 Document Revised: 01/15/2016 Document Reviewed: 06/23/2015 °Elsevier Interactive Patient Education © 2018 [employer] °Coronary Artery Bypass Grafting, Care After °These instructions give you information on caring for yourself after your procedure. Your doctor may also give you more specific instructions. Call your doctor if you have any problems or questions after your procedure. °Follow these instructions at home: °· Only take medicine as told by your doctor. Take medicines exactly as told. Do not stop taking medicines or start any new medicines without talking to your doctor first. °·   Take your pulse as told by your doctor. °· Do deep breathing as told by your doctor. Use your breathing device (incentive spirometer), if given, to practice deep breathing several times a day. Support your chest with a pillow or your arms when you take deep breaths or cough. °· Keep the area clean, dry, and protected where the  surgery cuts (incisions) were made. Remove bandages (dressings) only as told by your doctor. If strips were applied to surgical area, do not take them off. They fall off on their own. °· Check the surgery area daily for puffiness (swelling), redness, or leaking fluid. °· If surgery cuts were made in your legs: °? Avoid crossing your legs. °? Avoid sitting for long periods of time. Change positions every 30 minutes. °? Raise your legs when you are sitting. Place them on pillows. °· Wear stockings that help keep blood clots from forming in your legs (compression stockings). °· Only take sponge baths until your doctor says it is okay to take showers. Pat the surgery area dry. Do not rub the surgery area with a washcloth or towel. Do not bathe, swim, or use a hot tub until your doctor says it is okay. °· Eat foods that are high in fiber. These include raw fruits and vegetables, whole grains, beans, and nuts. Choose lean meats. Avoid canned, processed, and fried foods. °· Drink enough fluids to keep your pee (urine) clear or pale yellow. °· Weigh yourself every day. °· Rest and limit activity as told by your doctor. You may be told to: °? Stop any activity if you have chest pain, shortness of breath, changes in heartbeat, or dizziness. Get help right away if this happens. °? Move around often for short amounts of time or take short walks as told by your doctor. Gradually become more active. You may need help to strengthen your muscles and build endurance. °? Avoid lifting, pushing, or pulling anything heavier than 10 pounds (4.5 kg) for at least 6 weeks after surgery. °· Do not drive until your doctor says it is okay. °· Ask your doctor when you can go back to work. °· Ask your doctor when you can begin sexual activity again. °· Follow up with your doctor as told. °Contact a doctor if: °· You have puffiness, redness, more pain, or fluid draining from the incision site. °· You have a fever. °· You have puffiness in your  ankles or legs. °· You have pain in your legs. °· You gain 2 or more pounds (0.9 kg) a day. °· You feel sick to your stomach (nauseous) or throw up (vomit). °· You have watery poop (diarrhea). °Get help right away if: °· You have chest pain that goes to your jaw or arms. °· You have shortness of breath. °· You have a fast or irregular heartbeat. °· You notice a "clicking" in your breastbone when you move. °· You have numbness or weakness in your arms or legs. °· You feel dizzy or light-headed. °This information is not intended to replace advice given to you by your health care provider. Make sure you discuss any questions you have with your health care provider. °Document Released: 08/14/2013 Document Revised: 01/15/2016 Document Reviewed: 01/16/2013 °Elsevier Interactive Patient Education © 2017 Elsevier Inc. ° °

## 2018-04-10 NOTE — Progress Notes (Signed)
04/10/2018 1145 Received transfer into room 4E-01 from [place].  Pt is A&O, no C/O voiced at this time.  Tele monitor applied and CHG completed.  Oriented to room, call light and bed.  Call bell in reach, family at bedside. [place]

## 2018-04-10 NOTE — Consult Note (Signed)
[facility] CM Primary Care Navigator  04/10/2018  Joseph Mcmillan 15-Nov-1933 [id number]    Met with patient and wife Joseph Mcmillan) at the bedside to identify possible discharge needs. Patient reports having "weakness, fatigue, recurring chest pain that radiates to both arms" that had led to this admission and surgery. (Severe multivessel coronary artery disease/ unstable angina status post cardiac catheterization and CABG- coronary artery bypass grafting x 5)  Patient endorses Dr. Bertram Millard with [facility] as the primary care provider.   Patient shared using [facility] in [place] to obtain medications without any problem.   Patient states managing his own medications at home straight out of the containers.   Patient has been driving prior to admission/ surgery but wife will be providing transportation to his doctors' appointments after discharge.  According to patient, his wife will be the primary caregiver at home after discharge.   Anticipated discharge plan is home per patient.  Patient and wife voiced understanding to call primary care provider's office for a post discharge follow-up appointment within a 1- 2 weeks or sooner if needs arise. Patient letter (with PCP's contact number) was provided as a reminder.  Discussed with patient and wiferegarding [facility] CM services available for health managementandresourcesat homebutboth denied any needs or concerns at this time.Patient and wife verbalizedunderstandingof needto seekreferral from primary care provider to [facility] care management ifdeemed necessary and appropriatefor anyservicesin thefuture.  [facility] care management information was provided for futureneeds that patient may have.  Patienthad verbally agreedand optedforEMMIcalls tofollow-up with his recoveryat home.   Referral made for [facility] calls after discharge.    For additional questions please  contact:  Edwena Felty A. Naava Janeway, BSN, RN-BC [facility] PRIMARY CARE Navigator Cell: [phone]

## 2018-04-10 NOTE — Care Management Note (Signed)
Case Management Note  Patient Details  Name: Joseph Mcmillan MRN: [id number] Date of Birth: 03/23/1934  Subjective/Objective:     Pt is s/p CABG               Action/Plan:   Expected Discharge Date:                  Expected Discharge Plan:  Home/Self Care(Independent from home, wife will provide 24 hour supervision, has PCP)  In-House Referral:     Discharge planning Services  CM Consult  Post Acute Care Choice:    Choice offered to:     DME Arranged:    DME Agency:     HH Arranged:    [place] Agency:     Status of Service:  In process, will continue to follow  If discussed at Long Length of Stay Meetings, dates discussed:    Additional Comments:  Maryclare Labrador, RN 04/10/2018, 1:46 PM

## 2018-04-10 NOTE — Progress Notes (Signed)
[id number] Pt walked this morning and right before transfer. Education completed with pt and wife who voiced understanding. Reviewed staying in the tube, IS, ex ed and heart healthy food choices. Wrote down how to view discharge video. Discussed CRP 2 and referred to [facility]. Pt would like rolling walker for home use.  Graylon Good RN BSN 04/10/2018 3:13 PM

## 2018-04-10 NOTE — Discharge Summary (Signed)
Physician Discharge Summary  Patient ID: Joseph Mcmillan MRN: [id number] DOB/AGE: [birth year] 84 y.o.  Admit date: 03/30/2018 Discharge date: 04/13/2018  Admission Diagnoses: Severe multivessel coronary artery disease/unstable angina  Discharge Diagnoses:  Active Problems:   Coronary artery disease involving native coronary artery of native heart with unstable angina pectoris (HCC)   Unstable angina (HCC)   S/P CABG x 5   Malnutrition of moderate degree  Patient Active Problem List   Diagnosis Date Noted  . Malnutrition of moderate degree 04/10/2018  . S/P CABG x 5 04/04/2018  . Unstable angina ([place]) 03/30/2018  . Coronary artery disease involving native coronary artery of native heart with unstable angina pectoris ([place])   . Angina pectoris ([place]) 03/28/2018  . Blepharitis of upper and lower eyelids of both eyes 11/29/2017  . Keratoconjunctivitis sicca of both eyes not specified as Sjogren's 11/29/2017  . Coronary artery calcification seen on CAT scan 07/26/2017  . Fatigue due to excessive exertion 07/26/2017  . Weakness of both lower extremities 07/26/2017  . Carotid artery stenosis, symptomatic, left - s/p CEA 07/26/2017  . History of left-sided carotid endarterectomy 07/26/2017  . Essential hypertension 07/26/2017  . Hyperlipidemia with target LDL less than 70 07/26/2017  . Presbyopia of both eyes 03/01/2017  . Occlusion and stenosis of carotid artery without mention of cerebral infarction 10/30/2012  . Deprivation amblyopia 08/24/2011  . Dermatochalasis 08/24/2011  . Mature cataract 08/24/2011  . Posterior capsular opacification of right eye, not obscuring vision 08/24/2011  . Posterior vitreous detachment 08/24/2011  . Pseudophakia 08/24/2011    History of the present illness:  The patient is an 84 year old male who is recently been recently evaluated by cardiology for progressive symptoms of angina.  He has a past medical history of atherosclerotic vascular disease including  history of previous carotid endarterectomy.  Most recently he has been experience episodes of chest tightness with exertion with radiation into the left arm.  He did get relief with nitroglycerin from these episodes.  The cardiac risk factors include dyslipidemia, history of TIAs and is status post left carotid endarterectomy, and hypertension.  He underwent cardiac catheterization was found to have severe multivessel coronary artery disease.  He was seen in cardiothoracic surgical consultation by Dr. Cyndia Bent and agree with recommendations to proceed with coronary artery surgical revascularization.   Discharged Condition: good  Hospital Course: The patient was admitted for cardiac catheterization and due to the findings patient was recommended cardiovascular surgical consultation with Dr. Cyndia Bent.  He saw the patient and studies and agreed recommendations to proceed with surgery.  On 04/04/2018 he was taken to the operating room where he underwent the below described procedure.  He tolerated it well was taken to the surgical intensive care unit in stable condition.  Postoperative hospital course:  Patient is overall progressed well.  He was extubated without difficulty using standard postop protocols.  He did require transfusion for postoperative acute blood loss anemia.  All routine lines, monitors and drainage devices have been discontinued in the standard fashion.  He has required diuresis for postoperative volume overload.  He has been started on both beta-blocker and ACE inhibitors.  He did have postoperative atrial fibrillation was converted to sinus rhythm with amiodarone.  Unfortunately due to significant nausea the amiodarone had to be stopped.  Blood sugars are currently adequately controlled first using standard protocols.  Due to nausea is felt he is not able to tolerate metformin.  His hemoglobin A1c was 6.9 on no meds so he will  need close attention to diet as well as follow-up with his primary  care.  He has normal renal function.  Hemoglobin and hematocrit are 9.7 and 29.6 respectively.  He did have some postoperative orthostasis but this is gradually improved and ACE inhibitor has been stopped.  Additionally all diuretics have been stopped.  Time of discharge she is felt to be quite stable. Consults: cardiology  Significant Diagnostic Studies: angiography: Cardiac catheterization  Treatments: surgery:   CARDIOVASCULAR SURGERY OPERATIVE NOTE  04/04/2018  Surgeon:  Gaye Pollack, MD  First Assistant: Jadene Pierini,  PA-C   Preoperative Diagnosis:  Severe multi-vessel coronary artery disease   Postoperative Diagnosis:  Same   Procedure:  1. Median Sternotomy 2. Extracorporeal circulation 3.   Coronary artery bypass grafting x 5   Left internal mammary graft to the LAD  SVG to diagonal  SVG to OM  Sequential SVG to distal RCA and PDA 4.   Endoscopic vein harvest from the right and left legs   Anesthesia:  General Endotracheal Discharge Exam: Blood pressure 129/69, pulse 82, temperature 98.5 F (36.9 C), temperature source Oral, resp. rate 14, height 5\' 9"  (1.753 m), weight 67.7 kg, SpO2 100 %.  General appearance: alert, cooperative and no distress Heart: irregularly irregular rhythm Lungs: clear to auscultation bilaterally Abdomen: benign Extremities: no edema Wound: incis healing well Disposition: Discharge disposition: 01-Home or Self Care       Discharge Instructions    Amb Referral to Cardiac Rehabilitation   Complete by:  As directed    Referring to [facility] Phase 2   Diagnosis:  CABG   CABG X ___:  5   Discharge patient   Complete by:  As directed    Discharge disposition:  01-Home or Self Care   Discharge patient date:  04/13/2018     Allergies as of 04/13/2018      Reactions   Codeine Nausea And Vomiting      Medication List    STOP taking these medications   clopidogrel 75 MG tablet Commonly known as:  PLAVIX    nitroGLYCERIN 0.4 MG SL tablet Commonly known as:  NITROSTAT   simvastatin 40 MG tablet Commonly known as:  ZOCOR     TAKE these medications   acetaminophen 325 MG tablet Commonly known as:  TYLENOL Take 2 tablets (650 mg total) by mouth every 6 (six) hours as needed for mild pain or moderate pain.   aspirin 325 MG EC tablet Take 1 tablet (325 mg total) by mouth daily. Start taking on:  04/14/2018 What changed:    medication strength  how much to take   atorvastatin 20 MG tablet Commonly known as:  LIPITOR Take 1 tablet (20 mg total) by mouth daily at 6 PM.   hydrocortisone cream 1 % Apply topically 3 (three) times daily as needed for itching.   linaclotide 72 MCG capsule Commonly known as:  LINZESS Take 1 capsule (72 mcg total) by mouth daily before breakfast.   metoprolol tartrate 25 MG tablet Commonly known as:  LOPRESSOR Take 1 tablet (25 mg total) by mouth 2 (two) times daily.   PROSTATE SUPPORT 300-15 MG Tabs Take 1 capsule by mouth daily.            Durable Medical Equipment  (From admission, onward)         Start     Ordered   04/10/18 1515  For home use only DME Walker  Once    Question:  Patient needs  a walker to treat with the following condition  Answer:  Dyspnea   04/10/18 1515         Follow-up Information    Revankar, Reita Cliche, MD Follow up.   Specialty:  Cardiology Contact information: [address] [place]  [place] [address] [phone]        Gaye Pollack, MD Follow up.   Specialty:  Cardiothoracic Surgery Why:  Ointment to see the surgeon on 05/17/2018 at 4 PM.  Please obtain a chest x-ray at [facility] at 3:30 PM which is located in the same office complex. Contact information: [address] [place] [address] [phone]        [facility] Follow up.   Specialty:  Cardiothoracic Surgery Why:  Appointment to see the nurse for suture  removal on 04/17/2018 at 10:30 AM Contact information: [place], [place] Follow up.   Why:  Best boy information: [address] [place] [address] [phone]          The patient has been discharged on:   1.Beta Blocker:  Yes [ y  ]                              No   [   ]                              If No, reason:  2.Ace Inhibitor/ARB: Yes [   ]                                     No  [   n ]                                     If No, reason:low BP  3.Statin:   Yes Blue.Reese   ]                  No  [   ]                  If No, reason:  4.Ecasa:  Yes  [ y  ]                  No   [   ]                  If No, reason:   Signed: John Giovanni 04/13/2018, 12:50 PM

## 2018-04-11 LAB — GLUCOSE, CAPILLARY
GLUCOSE-CAPILLARY: 113 mg/dL — AB (ref 70–99)
Glucose-Capillary: 150 mg/dL — ABNORMAL HIGH (ref 70–99)
Glucose-Capillary: 142 mg/dL — ABNORMAL HIGH (ref 70–99)
Glucose-Capillary: 173 mg/dL — ABNORMAL HIGH (ref 70–99)

## 2018-04-11 LAB — ECHO TEE
AV Mean grad: 2 mmHg
Ao-prox: 3.6
LVOT diameter: 20 mm
MVG: 1 mmHg
SINUS: 3.5 cm
STJ: 3 cm

## 2018-04-11 MED ORDER — METFORMIN HCL 850 MG PO TABS
850.0000 mg | ORAL_TABLET | Freq: Two times a day (BID) | ORAL | Status: DC
  Administered 2018-04-11 – 2018-04-12 (×3): 850 mg via ORAL
  Filled 2018-04-11 (×3): qty 1

## 2018-04-11 MED ORDER — HYDROCORTISONE 1 % EX CREA
TOPICAL_CREAM | Freq: Three times a day (TID) | CUTANEOUS | Status: DC | PRN
  Filled 2018-04-11: qty 28

## 2018-04-11 NOTE — Progress Notes (Signed)
Assisted patient to restroom, no results made. Upon standing, patient became weak and unable to speak.  The left eye appeared to gaze to the upper left side.  Immediately assisted into the chair and vs checked and documented.  Patient became sweaty and "hot", then placed back to bed, vs rechecked. 117/59, hr 79 in NSR. Standing BP was 58/45 prior to lying back in bed. MD notified. Will hold any am BP medications or diuretics per MD order. Will notify PA upon rounds first thing this am.

## 2018-04-11 NOTE — Progress Notes (Addendum)
[address]       [place] [address]             [phone]      7 Days Post-Op Procedure(s) (LRB): CORONARY ARTERY BYPASS GRAFTING (CABG)TIMES 5, USING LEFT  INTERNAL MAMMARY ARTERY AND ENDOSCOPICALLY HARVESTED RIGHT AND LEFT SAPHENOUS VEIN (N/A) TRANSESOPHAGEAL ECHOCARDIOGRAM (TEE) (N/A) ENDOVEIN HARVEST OF GREATER SAPHENOUS VEIN (Right) Subjective: Had significant orthostatic event- well documented on nursing note Feels ok currently No dysrhythmias  Objective: Vital signs in last 24 hours: Temp:  [98.6 F (37 C)-99.1 F (37.3 C)] 99.1 F (37.3 C) (08/19 1947) Pulse Rate:  [42-91] 79 (08/20 0640) Cardiac Rhythm: Normal sinus rhythm (08/19 1900) Resp:  [17-27] 21 (08/20 0640) BP: (58-154)/(45-91) 117/59 (08/20 0640) SpO2:  [90 %-100 %] 92 % (08/20 0654) Weight:  [68.7 kg] 68.7 kg (08/20 0615)  Hemodynamic parameters for last 24 hours:    Intake/Output from previous day: 08/19 0701 - 08/20 0700 In: 240 [P.O.:240] Out: 400 [Urine:400] Intake/Output this shift: No intake/output data recorded.  General appearance: alert, cooperative and no distress Heart: regular rate and rhythm Lungs: mildly dim right>left base Abdomen: benign Extremities: no edema Wound: incis healing well  Lab Results: Recent Labs    04/09/18 0212  WBC 9.5  HGB 9.7*  HCT 29.6*  PLT 169   BMET:  Recent Labs    04/09/18 0212  NA 137  K 3.6  CL 95*  CO2 34*  GLUCOSE 111*  BUN 22  CREATININE 1.05  CALCIUM 8.6*    PT/INR: No results for input(s): LABPROT, INR in the last 72 hours. ABG    Component Value Date/Time   PHART 7.346 (L) 04/05/2018 0732   HCO3 22.6 04/05/2018 0732   TCO2 25 04/05/2018 1652   ACIDBASEDEF 3.0 (H) 04/05/2018 0732   O2SAT 98.0 04/05/2018 0732   CBG (last 3)  Recent Labs    04/10/18 0638 04/10/18 1600 04/10/18 2115  GLUCAP 138* 128* 184*    Meds Scheduled Meds: . amiodarone  200 mg Oral BID PC  . aspirin EC  325 mg  Oral Daily  . atorvastatin  80 mg Oral q1800  . bisacodyl  10 mg Oral Daily   Or  . bisacodyl  10 mg Rectal Daily  . docusate sodium  200 mg Oral Daily  . enoxaparin (LOVENOX) injection  30 mg Subcutaneous Q24H  . feeding supplement (GLUCERNA SHAKE)  237 mL Oral TID BM  . ferrous [id number] C-folic acid  1 capsule Oral TID PC  . furosemide  40 mg Oral Daily  . insulin aspart  0-24 Units Subcutaneous TID WC  . linaclotide  72 mcg Oral QAC breakfast  . lisinopril  10 mg Oral Daily  . metFORMIN  500 mg Oral BID WC  . metoprolol tartrate  25 mg Oral BID  . pantoprazole  40 mg Oral Daily  . potassium chloride  20 mEq Oral BID  . sodium chloride flush  3 mL Intravenous Q12H  . sodium chloride flush  3 mL Intravenous Q12H  . tamsulosin  0.4 mg Oral Daily   Continuous Infusions: . sodium chloride Stopped (04/05/18 0837)  . sodium chloride Stopped (04/08/18 1157)  . sodium chloride     PRN Meds:.sodium chloride, sodium chloride, metoprolol tartrate, ondansetron (ZOFRAN) IV, sodium chloride flush, sodium chloride flush, traMADol  Xrays No results found.  Assessment/Plan: S/P Procedure(s) (LRB): CORONARY ARTERY BYPASS GRAFTING (CABG)TIMES 5, USING LEFT  INTERNAL MAMMARY ARTERY  AND ENDOSCOPICALLY HARVESTED RIGHT AND LEFT SAPHENOUS VEIN (N/A) TRANSESOPHAGEAL ECHOCARDIOGRAM (TEE) (N/A) ENDOVEIN HARVEST OF GREATER SAPHENOUS VEIN (Right)  1 doing well but episode of orthostasis - will d/c lasix and put parameters on lisinopril for now. I don't think he needs IV fluids at this time. 2 maintaining sinus on amio/lopressor 3 BS should tol a higher glucophage dose- will adjust and monitor 4 routine pulm toilet and rehab  LOS: 12 days    John Giovanni 04/11/2018   Chart reviewed, patient examined, agree with above. He looks good overall but had orthostatic event early this am to 58/45 and about passed out with standing. Agree with stopping diuretic and will stop lisinopril for now.  Continue Lopressor. He is also on Flomax for BPH. Plan home in am if no further problems today. Discussed status with wife.

## 2018-04-11 NOTE — Progress Notes (Signed)
   04/11/18 0610 04/11/18 0629 04/11/18 0630  Vitals  BP 122/61 99/65 (!) 58/45  MAP (mmHg) 79 76 (!) 51  BP Location  --  Left Arm  --   BP Method  --  Automatic  --   Patient Position (if appropriate) Lying Sitting Standing  Pulse Rate 83  --  86  ECG Heart Rate 84  --  89  Resp (!) 23  --  (!) 23  Oxygen Therapy  SpO2 91 %  --  100 %    04/11/18 0640  Vitals  BP (!) 117/59  MAP (mmHg) 78  BP Location  --   BP Method  --   Patient Position (if appropriate) Lying  Pulse Rate 79  ECG Heart Rate 80  Resp (!) 21  Oxygen Therapy  SpO2  --

## 2018-04-11 NOTE — Progress Notes (Signed)
CARDIAC REHAB PHASE I   PRE:  Rate/Rhythm: 84 SR  BP:  Supine: 116/65  Sitting: 111/62  Standing: 81/52   SaO2: 93%RA  MODE:  Ambulation: 0 ft   POST:  Rate/Rhythm: 90 SR  BP:  Supine: 129/64  Sitting:   Standing:    SaO2:  1000- 1022 Came to see pt to walk. Did orthostatics as documented above. Pt did not feel well when standing and BP 81/52. Had pt lie back down. Has two cups of fresh water at bedside. Encouraged him to drink some fluids and to use IS. Notified RN of BP.  Adjusted pt's walker for home use.   Graylon Good, RN BSN  04/11/2018 10:18 AM

## 2018-04-12 ENCOUNTER — Inpatient Hospital Stay (HOSPITAL_COMMUNITY): Payer: Medicare Other

## 2018-04-12 DIAGNOSIS — I25110 Atherosclerotic heart disease of native coronary artery with unstable angina pectoris: Secondary | ICD-10-CM

## 2018-04-12 LAB — COMPREHENSIVE METABOLIC PANEL
ALT: 27 U/L (ref 0–44)
AST: 28 U/L (ref 15–41)
Albumin: 2.8 g/dL — ABNORMAL LOW (ref 3.5–5.0)
Alkaline Phosphatase: 112 U/L (ref 38–126)
Anion gap: 8 (ref 5–15)
BUN: 31 mg/dL — ABNORMAL HIGH (ref 8–23)
CO2: 30 mmol/L (ref 22–32)
Calcium: 8.5 mg/dL — ABNORMAL LOW (ref 8.9–10.3)
Chloride: 97 mmol/L — ABNORMAL LOW (ref 98–111)
Creatinine, Ser: 1.21 mg/dL (ref 0.61–1.24)
GFR calc Af Amer: >60 mL/min (ref >60)
GFR calc non Af Amer: 54 mL/min — ABNORMAL LOW (ref >60)
Glucose, Bld: 140 mg/dL — ABNORMAL HIGH (ref 70–99)
Potassium: 4 mmol/L (ref 3.5–5.1)
Sodium: 135 mmol/L (ref 135–145)
Total Bilirubin: 1.2 mg/dL (ref 0.3–1.2)
Total Protein: 5.2 g/dL — ABNORMAL LOW (ref 6.5–8.1)

## 2018-04-12 LAB — ECHOCARDIOGRAM LIMITED
Height: 69 in
Weight: 2388.8 oz

## 2018-04-12 LAB — GLUCOSE, CAPILLARY
GLUCOSE-CAPILLARY: 139 mg/dL — AB (ref 70–99)
Glucose-Capillary: 127 mg/dL — ABNORMAL HIGH (ref 70–99)
Glucose-Capillary: 140 mg/dL — ABNORMAL HIGH (ref 70–99)
Glucose-Capillary: 147 mg/dL — ABNORMAL HIGH (ref 70–99)

## 2018-04-12 MED ORDER — DIPHENHYDRAMINE HCL 25 MG PO CAPS: 25.0000 mg | ORAL_CAPSULE | Freq: Every evening | ORAL | Status: DC | PRN

## 2018-04-12 MED ORDER — MELATONIN 3 MG PO TABS
3.0000 mg | ORAL_TABLET | Freq: Every day | ORAL | Status: DC
  Administered 2018-04-12: 3 mg via ORAL
  Filled 2018-04-12: qty 1

## 2018-04-12 NOTE — Progress Notes (Addendum)
[address]       [place] [address]             [phone]      8 Days Post-Op Procedure(s) (LRB): CORONARY ARTERY BYPASS GRAFTING (CABG)TIMES 5, USING LEFT  INTERNAL MAMMARY ARTERY AND ENDOSCOPICALLY HARVESTED RIGHT AND LEFT SAPHENOUS VEIN (N/A) TRANSESOPHAGEAL ECHOCARDIOGRAM (TEE) (N/A) ENDOVEIN HARVEST OF GREATER SAPHENOUS VEIN (Right) Subjective: Feels very poorly, not sleeping, nauseated Back in afib  Objective: Vital signs in last 24 hours: Temp:  [98.6 F (37 C)-99.9 F (37.7 C)] 98.6 F (37 C) (08/21 0428) Pulse Rate:  [84-90] 86 (08/21 0428) Cardiac Rhythm: Sinus tachycardia (08/21 0700) Resp:  [23] 23 (08/21 0428) BP: (121-129)/(61-72) 121/61 (08/21 0428) SpO2:  [94 %] 94 % (08/21 0428) Weight:  [67.7 kg] 67.7 kg (08/21 0149)  Hemodynamic parameters for last 24 hours:    Intake/Output from previous day: 08/20 0701 - 08/21 0700 In: 606 [P.O.:600; I.V.:6] Out: 700 [Urine:700] Intake/Output this shift: No intake/output data recorded.  General appearance: distracted, fatigued and no distress Heart: irregularly irregular rhythm Lungs: dim in lower fields Abdomen: soft, nontender Extremities: no edema Wound: incis healing well  Lab Results: No results for input(s): WBC, HGB, HCT, PLT in the last 72 hours. BMET: No results for input(s): NA, K, CL, CO2, GLUCOSE, BUN, CREATININE, CALCIUM in the last 72 hours.  PT/INR: No results for input(s): LABPROT, INR in the last 72 hours. ABG    Component Value Date/Time   PHART 7.346 (L) 04/05/2018 0732   HCO3 22.6 04/05/2018 0732   TCO2 25 04/05/2018 1652   ACIDBASEDEF 3.0 (H) 04/05/2018 0732   O2SAT 98.0 04/05/2018 0732   CBG (last 3)  Recent Labs    04/11/18 1605 04/11/18 2100 04/12/18 0630  GLUCAP 173* 113* 127*    Meds Scheduled Meds: . amiodarone  200 mg Oral BID PC  . aspirin EC  325 mg Oral Daily  . atorvastatin  80 mg Oral q1800  . bisacodyl  10 mg Oral Daily   Or  .  bisacodyl  10 mg Rectal Daily  . docusate sodium  200 mg Oral Daily  . enoxaparin (LOVENOX) injection  30 mg Subcutaneous Q24H  . feeding supplement (GLUCERNA SHAKE)  237 mL Oral TID BM  . ferrous [id number] C-folic acid  1 capsule Oral TID PC  . insulin aspart  0-24 Units Subcutaneous TID WC  . linaclotide  72 mcg Oral QAC breakfast  . metFORMIN  850 mg Oral BID WC  . metoprolol tartrate  25 mg Oral BID  . pantoprazole  40 mg Oral Daily  . sodium chloride flush  3 mL Intravenous Q12H  . sodium chloride flush  3 mL Intravenous Q12H  . tamsulosin  0.4 mg Oral Daily   Continuous Infusions: . sodium chloride Stopped (04/05/18 0837)  . sodium chloride Stopped (04/08/18 1157)  . sodium chloride     PRN Meds:.sodium chloride, sodium chloride, hydrocortisone cream, metoprolol tartrate, ondansetron (ZOFRAN) IV, sodium chloride flush, sodium chloride flush, traMADol  Xrays No results found.  Assessment/Plan: S/P Procedure(s) (LRB): CORONARY ARTERY BYPASS GRAFTING (CABG)TIMES 5, USING LEFT  INTERNAL MAMMARY ARTERY AND ENDOSCOPICALLY HARVESTED RIGHT AND LEFT SAPHENOUS VEIN (N/A) TRANSESOPHAGEAL ECHOCARDIOGRAM (TEE) (N/A) ENDOVEIN HARVEST OF GREATER SAPHENOUS VEIN (Right)  1 not feeling well, multifactorial, BP improved 2 afib, rapid at times, cont amio and beta blocker- if nausea persists, may need to rethink amio 3 recheck labs, include LFT's 4 repeat CXR  to eval effus 5 sugars reasonably controlled on current metformin   LOS: 13 days    Joseph Mcmillan 04/12/2018 Pager [phone]   Chart reviewed, patient examined, agree with above. He does not feel well overall. Complains of nausea and vomited this am after drinking some orange juice. No appetite. This could be due to amiodarone so will stop it for now. Bowels have been moving but some loose stool. He has IBS and is on Linzess. He was also started on Metformin for DM postop and this could be causing some loose stool and  nausea so will stop for now. Abdomen is benign. He has also had orthostasis so will avoid further diuresis and stop Flomax. Will check labs. 2D echo to rule out pericardial effusion.

## 2018-04-12 NOTE — Progress Notes (Signed)
Patient ambulated 490 ft in hallway with front wheel walker. Patient carrying a conversation without SOB. Tolerated ambulation well. Patient sats 93% on RA. Resting in room. Will continue to monitor. Lajoyce Corners, RN

## 2018-04-12 NOTE — Progress Notes (Signed)
Patient with several bouts of  Nausea today. + orthostatics this am. Patient did not feel like ambulating in hall though asked several times. States that he feels "wiped out and weak."  Very poor appetite only ate a few bites for lunch and dinner. Did tolerate Glucerna supplements.

## 2018-04-12 NOTE — Progress Notes (Signed)
CARDIAC REHAB PHASE I   PRE:  Rate/Rhythm: few sinus beats and then lots of PACs to atrial fib  BP:  Supine: 93.58  Sitting: 92/44  Standing: 73/41   SaO2: 92-98%RA  MODE:  Ambulation: 0 ft   POST:  Rate/Rhythm: 108 afib  BP:  Supine: 90/55   0930-1009 Came to see pt to walk. Pt not feeling well and nauseated at times. Upon standing, pt felt worse as documented above with decrease in BP. Upon standing, noticed pt had soiled gown and pad. Cleaned pt and changed gown and pad. BP better upon lying down again.  Encouraged pt to walk with staff later if BP better. Gave pt's wife diabetic diet as she stated she did not know he was diabetic and PA had notified her this morning. I  told her A1C at 6.9. Discussed carb counting. Needs continued ed on diabetes as she stated pt was not on meds previously for it.  Graylon Good, RN BSN  04/12/2018 10:04 AM

## 2018-04-13 DIAGNOSIS — I480 Paroxysmal atrial fibrillation: Secondary | ICD-10-CM

## 2018-04-13 DIAGNOSIS — Z951 Presence of aortocoronary bypass graft: Secondary | ICD-10-CM

## 2018-04-13 LAB — CBC
HCT: 29 % — ABNORMAL LOW (ref 39.0–52.0)
Hemoglobin: 9.4 g/dL — ABNORMAL LOW (ref 13.0–17.0)
MCH: 30 pg (ref 26.0–34.0)
MCHC: 32.4 g/dL (ref 30.0–36.0)
MCV: 92.7 fL (ref 78.0–100.0)
PLATELETS: 296 10*3/uL (ref 150–400)
RBC: 3.13 MIL/uL — ABNORMAL LOW (ref 4.22–5.81)
RDW: 14.4 % (ref 11.5–15.5)
WBC: 10.3 10*3/uL (ref 4.0–10.5)

## 2018-04-13 LAB — GLUCOSE, CAPILLARY
GLUCOSE-CAPILLARY: 168 mg/dL — AB (ref 70–99)
Glucose-Capillary: 141 mg/dL — ABNORMAL HIGH (ref 70–99)

## 2018-04-13 MED ORDER — ACETAMINOPHEN 325 MG PO TABS: 650.0000 mg | ORAL_TABLET | Freq: Four times a day (QID) | ORAL | Status: DC | PRN

## 2018-04-13 MED ORDER — ASPIRIN 325 MG PO TBEC: 325.0000 mg | DELAYED_RELEASE_TABLET | Freq: Every day | ORAL | Status: DC

## 2018-04-13 MED ORDER — METOPROLOL TARTRATE 25 MG PO TABS: 25.0000 mg | ORAL_TABLET | Freq: Two times a day (BID) | ORAL | 1 refills | Status: DC

## 2018-04-13 MED ORDER — ATORVASTATIN CALCIUM 20 MG PO TABS: 20.0000 mg | ORAL_TABLET | Freq: Every day | ORAL | 1 refills | Status: DC

## 2018-04-13 MED ORDER — ATORVASTATIN CALCIUM 20 MG PO TABS: 20.0000 mg | ORAL_TABLET | Freq: Every day | ORAL | Status: DC

## 2018-04-13 MED ORDER — HYDROCORTISONE 1 % EX CREA: TOPICAL_CREAM | Freq: Three times a day (TID) | CUTANEOUS | 0 refills | Status: DC | PRN

## 2018-04-13 NOTE — Progress Notes (Signed)
[address]       [name] [address]             [phone]      9 Days Post-Op Procedure(s) (LRB): CORONARY ARTERY BYPASS GRAFTING (CABG)TIMES 5, USING LEFT  INTERNAL MAMMARY ARTERY AND ENDOSCOPICALLY HARVESTED RIGHT AND LEFT SAPHENOUS VEIN (N/A) TRANSESOPHAGEAL ECHOCARDIOGRAM (TEE) (N/A) ENDOVEIN HARVEST OF GREATER SAPHENOUS VEIN (Right) Subjective: Feels much better this am , no nausea overnight, slept much better  Some afib/pac's, mostly sinus rhythm  Objective: Vital signs in last 24 hours: Temp:  [97.8 F (36.6 C)-98.5 F (36.9 C)] 98.5 F (36.9 C) (08/22 0410) Pulse Rate:  [85-93] 85 (08/22 0410) Cardiac Rhythm: Normal sinus rhythm (08/22 0700) Resp:  [14-21] 17 (08/22 0410) BP: (90-134)/(53-71) 134/71 (08/22 0410) SpO2:  [92 %-97 %] 93 % (08/22 0410) Weight:  [67.7 kg] 67.7 kg (08/22 0435)  Hemodynamic parameters for last 24 hours:    Intake/Output from previous day: 08/21 0701 - 08/22 0700 In: 603 [P.O.:600; I.V.:3] Out: 1000 [Urine:1000] Intake/Output this shift: No intake/output data recorded.  General appearance: alert, cooperative and no distress Heart: irregularly irregular rhythm Lungs: clear to auscultation bilaterally Abdomen: benign Extremities: no edema Wound: incis healing well  Lab Results: Recent Labs    04/13/18 0240  WBC 10.3  HGB 9.4*  HCT 29.0*  PLT 296   BMET:  Recent Labs    04/12/18 0936  NA 135  K 4.0  CL 97*  CO2 30  GLUCOSE 140*  BUN 31*  CREATININE 1.21  CALCIUM 8.5*    PT/INR: No results for input(s): LABPROT, INR in the last 72 hours. ABG    Component Value Date/Time   PHART 7.346 (L) 04/05/2018 0732   HCO3 22.6 04/05/2018 0732   TCO2 25 04/05/2018 1652   ACIDBASEDEF 3.0 (H) 04/05/2018 0732   O2SAT 98.0 04/05/2018 0732   CBG (last 3)  Recent Labs    04/12/18 1617 04/12/18 2015 04/13/18 0609  GLUCAP 147* 139* 168*    Meds Scheduled Meds: . aspirin EC  325 mg Oral Daily    . atorvastatin  80 mg Oral q1800  . enoxaparin (LOVENOX) injection  30 mg Subcutaneous Q24H  . feeding supplement (GLUCERNA SHAKE)  237 mL Oral TID BM  . insulin aspart  0-24 Units Subcutaneous TID WC  . linaclotide  72 mcg Oral QAC breakfast  . Melatonin  3 mg Oral QHS  . metoprolol tartrate  25 mg Oral BID  . pantoprazole  40 mg Oral Daily  . sodium chloride flush  3 mL Intravenous Q12H  . sodium chloride flush  3 mL Intravenous Q12H   Continuous Infusions: . sodium chloride Stopped (04/05/18 0837)  . sodium chloride Stopped (04/08/18 1157)  . sodium chloride     PRN Meds:.sodium chloride, sodium chloride, hydrocortisone cream, metoprolol tartrate, ondansetron (ZOFRAN) IV, sodium chloride flush, sodium chloride flush, traMADol  Xrays Dg Chest 2 View  Result Date: 04/12/2018 CLINICAL DATA:  Followup CABG EXAM: CHEST - 2 VIEW COMPARISON:  04/09/2018 FINDINGS: Previous median sternotomy and CABG. There are small bilateral effusions, possibly slightly larger on the left. There is basilar atelectasis, slightly worsened on the left. Upper lungs are clear. IMPRESSION: Small effusions and basilar atelectasis, left worse than right. This appears slightly more pronounced than on the study of 3 days ago. Electronically Signed   By: Nelson Chimes M.D.   On: 04/12/2018 12:44    Assessment/Plan: S/P Procedure(s) (LRB): CORONARY  ARTERY BYPASS GRAFTING (CABG)TIMES 5, USING LEFT  INTERNAL MAMMARY ARTERY AND ENDOSCOPICALLY HARVESTED RIGHT AND LEFT SAPHENOUS VEIN (N/A) TRANSESOPHAGEAL ECHOCARDIOGRAM (TEE) (N/A) ENDOVEIN HARVEST OF GREATER SAPHENOUS VEIN (Right)  1 clinically improved and feeling better with no current nausea, slept well 2 some afib- amio was stopped as probably primary culprit for nausea- will need to make further plan for RX. Cont to observe for now on beta blocker 3 BS control is reasonable off metformin- push nutrition management 4 H/H is stable 5 no leukocytosis 6 reduce  statin dose as his lipid profile is actually excellent and could be contributing to weakness and minimal evidence high intensity stain is useful in octogenarians      LOS: 14 days    Joseph Mcmillan 04/13/2018 Pager [phone]

## 2018-04-13 NOTE — Progress Notes (Signed)
CARDIAC REHAB PHASE I   PRE:  Rate/Rhythm: 77 SR lots of PACs  BP:  Supine: 129/57  Sitting:   Standing:    SaO2: 93%RA  MODE:  Ambulation: 470 ft   POST:  Rate/Rhythm: 93 SR PACs  BP:  Supine:   Sitting: 149/75  Standing:    SaO2: 97%RA 0952-1040 Pt walked 470 ft on RA with his rolling walker with steady gait and tolerated well. No dizziness. BP stable. To recliner after walk. Wife in room.   Graylon Good, RN BSN  04/13/2018 10:36 AM

## 2018-04-13 NOTE — Progress Notes (Signed)
Pt and wife educated and provided discharge instructions. IV's removed and intact. Pt has all belongings and walker.Vitals stable. Pt tx via wheelchair by volunteers to valet parking to meet wife. Jerald Kief

## 2018-04-13 NOTE — Progress Notes (Signed)
Progress Note  Patient Name: Joseph Mcmillan Date of Encounter: 04/13/2018  Primary Cardiologist: Jenean Lindau, MD   Subjective   Feeling better today with less nausea. No chest pain or dyspnea.   Inpatient Medications    Scheduled Meds: . aspirin EC  325 mg Oral Daily  . atorvastatin  20 mg Oral q1800  . enoxaparin (LOVENOX) injection  30 mg Subcutaneous Q24H  . feeding supplement (GLUCERNA SHAKE)  237 mL Oral TID BM  . insulin aspart  0-24 Units Subcutaneous TID WC  . linaclotide  72 mcg Oral QAC breakfast  . Melatonin  3 mg Oral QHS  . metoprolol tartrate  25 mg Oral BID  . pantoprazole  40 mg Oral Daily  . sodium chloride flush  3 mL Intravenous Q12H  . sodium chloride flush  3 mL Intravenous Q12H   Continuous Infusions: . sodium chloride Stopped (04/05/18 0837)  . sodium chloride Stopped (04/08/18 1157)  . sodium chloride     PRN Meds: sodium chloride, sodium chloride, hydrocortisone cream, metoprolol tartrate, ondansetron (ZOFRAN) IV, sodium chloride flush, sodium chloride flush, traMADol   Vital Signs    Vitals:   04/12/18 2359 04/13/18 0410 04/13/18 0435 04/13/18 0808  BP: 110/68 134/71    Pulse: 85 85  80  Resp: 14 17    Temp:  98.5 F (36.9 C)    TempSrc:  Oral    SpO2: 93% 93%    Weight:   67.7 kg   Height:        Intake/Output Summary (Last 24 hours) at 04/13/2018 0926 Last data filed at 04/13/2018 0900 Gross per 24 hour  Intake 586 ml  Output 1000 ml  Net -414 ml   Filed Weights   04/11/18 0615 04/12/18 0149 04/13/18 0435  Weight: 68.7 kg 67.7 kg 67.7 kg    Telemetry    NSR with frequent PACs- Personally Reviewed  ECG    None today - Personally Reviewed  Physical Exam   GEN: elderly WM in No acute distress.   Neck: No JVD Cardiac: RRR, no murmurs, rubs, or gallops. Median sternotomy scar.  Respiratory: Clear to auscultation bilaterally. GI: Soft, nontender, non-distended  MS: No edema; No deformity. Neuro:  Nonfocal  Psych:  Normal affect   Labs    Chemistry Recent Labs  Lab 04/08/18 0548 04/09/18 0212 04/12/18 0936  NA 137 137 135  K 3.7 3.6 4.0  CL 98 95* 97*  CO2 32 34* 30  GLUCOSE 140* 111* 140*  BUN 23 22 31*  CREATININE 0.94 1.05 1.21  CALCIUM 8.4* 8.6* 8.5*  PROT  --   --  5.2*  ALBUMIN  --   --  2.8*  AST  --   --  28  ALT  --   --  27  ALKPHOS  --   --  112  BILITOT  --   --  1.2  GFRNONAA >60 >60 54*  GFRAA >60 >60 >60  ANIONGAP 7 8 8      Hematology Recent Labs  Lab 04/08/18 0548 04/09/18 0212 04/13/18 0240  WBC 8.7 9.5 10.3  RBC 2.97* 3.24* 3.13*  HGB 8.8* 9.7* 9.4*  HCT 27.2* 29.6* 29.0*  MCV 91.6 91.4 92.7  MCH 29.6 29.9 30.0  MCHC 32.4 32.8 32.4  RDW 14.6 14.3 14.4  PLT 120* 169 296    Cardiac EnzymesNo results for input(s): TROPONINI in the last 168 hours. No results for input(s): TROPIPOC in the last 168 hours.  BNPNo results for input(s): BNP, PROBNP in the last 168 hours.   DDimer No results for input(s): DDIMER in the last 168 hours.   Radiology    Dg Chest 2 View  Result Date: 04/12/2018 CLINICAL DATA:  Followup CABG EXAM: CHEST - 2 VIEW COMPARISON:  04/09/2018 FINDINGS: Previous median sternotomy and CABG. There are small bilateral effusions, possibly slightly larger on the left. There is basilar atelectasis, slightly worsened on the left. Upper lungs are clear. IMPRESSION: Small effusions and basilar atelectasis, left worse than right. This appears slightly more pronounced than on the study of 3 days ago. Electronically Signed   By: Nelson Chimes M.D.   On: 04/12/2018 12:44    Cardiac Studies   Echo: 03/31/18: Study Conclusions  - Left ventricle: The cavity size was normal. There was mild focal   basal hypertrophy of the septum. Systolic function was normal.   The estimated ejection fraction was in the range of 50% to 55%.   Wall motion was normal; there were no regional wall motion   abnormalities. The study is indeterminate for the evaluation of    LV diastolic function. - Aortic valve: There was no significant regurgitation. - Mitral valve: There was no significant regurgitation. - Right ventricle: Systolic function was normal. - Atrial septum: No defect or patent foramen ovale was identified. - Tricuspid valve: There was trivial regurgitation. - Pulmonic valve: There was trivial regurgitation.  Impressions:  - Normal LV systolic function, no significant valvular disease.  TEE 04/04/18: Conclusions   Result status: Final result   Tricuspid valve: Trace regurgitation.  Mitral valve: Mobile echodensity subvalvular likely representing hypermobile chordae tendinea No leaflet thickening and calcification present.  Aortic valve: The valve is trileaflet. Mild valve thickening present. No stenosis. No regurgitation.  Pulmonic valve: Trace regurgitation.  Right ventricle: Normal cavity size, wall thickness and ejection fraction.  Septum: No Patent Foramen Ovale present.  Left atrium: Patent foramen ovale not present.  Right ventricle: Normal cavity size and ejection fraction.   Echo 04/12/18: Study Conclusions  - Left ventricle: The cavity size was normal. Wall thickness was   normal. Systolic function was normal. The estimated ejection   fraction was in the range of 55% to 60%. Diffuse hypokinesis. The   study is not technically sufficient to allow evaluation of LV   diastolic function. - Tricuspid valve: There was trivial regurgitation.  Patient Profile     84 y.o. male with history of carotid disease, HLD, s/p CEA presented with progressive angina. Cardiac cath demonstrated severe CAD and he underwent CABG on 04/04/18:   Assessment & Plan    1. CAD with progressive angina. S/p CABG on 04/04/18. Clinically doing well. On ASA, statin, beta blocker.  2. Post op Afib. Was on amiodarone with conversion to NSR. Still some paroxysmal arrhythmia. Amiodarone discontinued due to GI intolerance.  Currently in NSR with PACs.  Continue beta blocker therapy 3. HLD on statin.  4. Carotid arterial disease s/p CEA on left.   CARDIOLOGY RECOMMENDATIONS:  Discharge is anticipated in the next 48 hours. Recommendations for medications and follow up:  Discharge Medications: Continue medications as they are currently listed in the [facility]. Exceptions to the above:  none  Follow Up: The patient's Primary Cardiologist is Jenean Lindau, MD   Follow up in the office in 2 week(s).  Signed,  Syrus Nakama [place], MD  9:33 AM 04/13/2018  [facility]   For questions or updates, please contact [facility] Please consult [email or web address] for  contact info under Cardiology/STEMI.      Signed, Emeril Stille [place], MD  04/13/2018, 9:26 AM

## 2018-04-13 NOTE — Care Management Note (Signed)
Case Management Note Joseph Gibbons RN, BSN Unit 4E- RN Care Coordinator  [phone]  Patient Details  Name: Joseph Mcmillan MRN: [id number] Date of Birth: March 21, 1934  Subjective/Objective:   Pt admitted CAD, s/p CABGx5 post op afib                 Action/Plan: PTA pt lived at home with wife, to transition home with wife, order placed for RW, [facility] notified for DME need- has RW at the bedside.   Expected Discharge Date:  04/13/18               Expected Discharge Plan:  Home/Self Care(Independent from home, wife will provide 24 hour supervision, has PCP)  In-House Referral:  NA  Discharge planning Services  CM Consult  Post Acute Care Choice:  Durable Medical Equipment Choice offered to:  Patient  DME Arranged:  [place] rolling DME Agency:  [place]:    [place] Agency:     Status of Service:  Completed, signed off  If discussed at [place] of Stay Meetings, dates discussed:    Discharge Disposition: home/self care   Additional Comments:  Dawayne Patricia, RN 04/13/2018, 1:57 PM

## 2018-04-14 ENCOUNTER — Telehealth: Payer: Self-pay

## 2018-04-14 NOTE — Telephone Encounter (Signed)
Spoke with patient's wife regarding his left sided chest tube suture site oozing blood.  She stated that she spoke with the physician on call and was told to place a cold rag over the site, until it stopped then dress it with gauze and tape.  She stated she did that and it looks much better.  All questions answered will see next week for suture removal.

## 2018-04-17 ENCOUNTER — Encounter (INDEPENDENT_AMBULATORY_CARE_PROVIDER_SITE_OTHER): Payer: Self-pay | Admitting: *Deleted

## 2018-04-17 DIAGNOSIS — Z4802 Encounter for removal of sutures: Secondary | ICD-10-CM

## 2018-04-19 DIAGNOSIS — E119 Type 2 diabetes mellitus without complications: Secondary | ICD-10-CM | POA: Diagnosis not present

## 2018-04-19 DIAGNOSIS — Z6821 Body mass index (BMI) 21.0-21.9, adult: Secondary | ICD-10-CM | POA: Diagnosis not present

## 2018-04-19 DIAGNOSIS — F5102 Adjustment insomnia: Secondary | ICD-10-CM | POA: Diagnosis not present

## 2018-04-19 DIAGNOSIS — E782 Mixed hyperlipidemia: Secondary | ICD-10-CM | POA: Diagnosis not present

## 2018-04-19 DIAGNOSIS — I2510 Atherosclerotic heart disease of native coronary artery without angina pectoris: Secondary | ICD-10-CM | POA: Diagnosis not present

## 2018-04-21 ENCOUNTER — Ambulatory Visit (INDEPENDENT_AMBULATORY_CARE_PROVIDER_SITE_OTHER): Payer: Medicare Other | Admitting: Cardiology

## 2018-04-21 ENCOUNTER — Encounter: Payer: Self-pay | Admitting: Cardiology

## 2018-04-21 VITALS — BP 122/68 | HR 80 | Ht 69.0 in | Wt 143.0 lb

## 2018-04-21 DIAGNOSIS — Z951 Presence of aortocoronary bypass graft: Secondary | ICD-10-CM | POA: Diagnosis not present

## 2018-04-21 DIAGNOSIS — I10 Essential (primary) hypertension: Secondary | ICD-10-CM | POA: Diagnosis not present

## 2018-04-21 DIAGNOSIS — I6522 Occlusion and stenosis of left carotid artery: Secondary | ICD-10-CM | POA: Diagnosis not present

## 2018-04-21 DIAGNOSIS — I25110 Atherosclerotic heart disease of native coronary artery with unstable angina pectoris: Secondary | ICD-10-CM | POA: Diagnosis not present

## 2018-04-21 NOTE — Patient Instructions (Signed)
Medication Instructions:  Your physician recommends that you continue on your current medications as directed. Please refer to the Current Medication list given to you today.  Labwork: Your physician recommends that you have the following labs drawn: CBC and BMP  Testing/Procedures: None  Follow-Up: Your physician recommends that you schedule a follow-up appointment in: 1 month  Any Other Special Instructions Will Be Listed Below (If Applicable).     If you need a refill on your cardiac medications before your next appointment, please call your pharmacy.   [place], RN, BSN

## 2018-04-21 NOTE — Progress Notes (Signed)
Cardiology Office Note:    Date:  04/21/2018   ID:  KEISUKE HOLLABAUGH, DOB [birth year], MRN [id number]  PCP:  Mateo Flow, MD  Cardiologist:  Jenean Lindau, MD   Referring MD: Mateo Flow, MD    ASSESSMENT:    1. Coronary artery disease involving native coronary artery of native heart with unstable angina pectoris (HCC)   2. Carotid artery stenosis, symptomatic, left - s/p CEA   3. Essential hypertension   4. S/P CABG x 5    PLAN:    In order of problems listed above:  1. Secondary prevention stressed with the patient importance of compliance with diet and medication stressed he vocalized understanding.  His blood pressure is stable.  Diet was discussed with him at length. 2. He had an extended stay in the hospital and his wife mentions to me that there was some issues of bleeding.  In view of this I will do a Chem-7 and CBC. 3. I reviewed his medications.  He will be seen in follow-up appointment in a month or earlier if he has any concerns.   Medication Adjustments/Labs and Tests Ordered: Current medicines are reviewed at length with the patient today.  Concerns regarding medicines are outlined above.  No orders of the defined types were placed in this encounter.  No orders of the defined types were placed in this encounter.    No chief complaint on file.    History of Present Illness:    Joseph Mcmillan is a 84 y.o. male.  Patient was evaluated by me and sent for coronary angiography and had multivessel disease and underwent bypass surgery.  It has been a week since he was released from the hospital.  He denies any problems at this time and takes care of activities of daily living.  Is getting better from his surgery.  His first week obviously was difficult for him but he says he is improving significantly and his wife accompanies him for his visit.  At the time of my evaluation, the patient is alert awake oriented and in no distress.  Past Medical History:  Diagnosis Date   . Aortic calcification (HCC)    Seen on CT scan  . BPH (benign prostatic hypertrophy)   . CAD (coronary artery disease)    Coronary artery calcification noted on CT scan  . Cataract, right eye 1994   Extracted with lens implant  . History of kidney stones   . History of TIAs 2011   Status post left carotid endarterectomy  . Hyperlipidemia   . Hypertension   . Left-sided carotid artery disease ([place]) 02/2010   Left carotid endarterectomy  . Pernicious anemia     Past Surgical History:  Procedure Laterality Date  . CARDIAC CATHETERIZATION  03/30/2018  . CAROTID ARTERY ANGIOPLASTY Left   . CAROTID ENDARTERECTOMY  03-06-2010   Left CEA  . CATARACT EXTRACTION W/ INTRAOCULAR LENS IMPLANT Right   . CHOLECYSTECTOMY    . CORONARY ARTERY BYPASS GRAFT N/A 04/04/2018   Procedure: CORONARY ARTERY BYPASS GRAFTING (CABG)TIMES 5, USING LEFT  INTERNAL MAMMARY ARTERY AND ENDOSCOPICALLY HARVESTED RIGHT AND LEFT SAPHENOUS VEIN;  Surgeon: Gaye Pollack, MD;  Location: [place];  Service: Open Heart Surgery;  Laterality: N/A;  . ENDOVEIN HARVEST OF GREATER SAPHENOUS VEIN Right 04/04/2018   Procedure: ENDOVEIN HARVEST OF GREATER SAPHENOUS VEIN;  Surgeon: Gaye Pollack, MD;  Location: [place];  Service: Open Heart Surgery;  Laterality: Right;  . LEFT HEART  CATH AND CORONARY ANGIOGRAPHY N/A 03/30/2018   Procedure: LEFT HEART CATH AND CORONARY ANGIOGRAPHY;  Surgeon: Jettie Booze, MD;  Location: [place] [facility];  Service: Cardiovascular;  Laterality: N/A;  . MELANOMA EXCISION    . TEE WITHOUT CARDIOVERSION N/A 04/04/2018   Procedure: TRANSESOPHAGEAL ECHOCARDIOGRAM (TEE);  Surgeon: Gaye Pollack, MD;  Location: [place];  Service: Open Heart Surgery;  Laterality: N/A;    Current Medications: Current Meds  Medication Sig  . acetaminophen (TYLENOL) 325 MG tablet Take 2 tablets (650 mg total) by mouth every 6 (six) hours as needed for mild pain or moderate pain.  Marland Kitchen aspirin EC 325 MG EC tablet Take 1  tablet (325 mg total) by mouth daily.  . hydrocortisone cream 1 % Apply topically 3 (three) times daily as needed for itching.  . metoprolol tartrate (LOPRESSOR) 25 MG tablet Take 1 tablet (25 mg total) by mouth 2 (two) times daily.  . Misc Natural Products (PROSTATE SUPPORT) 300-15 MG TABS Take 1 capsule by mouth daily.     Allergies:   Codeine   Social History   Socioeconomic History  . Marital status: Married    Spouse name: Not on file  . Number of children: Not on file  . Years of education: Not on file  . Highest education level: Not on file  Occupational History  . Occupation: Retired  Scientific laboratory technician  . Financial resource strain: Not hard at all  . Food insecurity:    Worry: Never true    Inability: Never true  . Transportation needs:    Medical: No    Non-medical: No  Tobacco Use  . Smoking status: Never Smoker  . Smokeless tobacco: Never Used  Substance and Sexual Activity  . Alcohol use: No  . Drug use: No  . Sexual activity: Not on file  Lifestyle  . Physical activity:    Days per week: 0 days    Minutes per session: 0 min  . Stress: Only a little  Relationships  . Social connections:    Talks on phone: Three times a week    Gets together: Three times a week    Attends religious service: More than 4 times per year    Active member of club or organization: No    Attends meetings of clubs or organizations: Never    Relationship status: Married  Other Topics Concern  . Not on file  Social History Narrative  . Not on file     Family History: The patient's family history includes Cancer in his mother; Stroke in his father.  ROS:   Please see the history of present illness.    All other systems reviewed and are negative.    EKGs/Labs/Other Studies Reviewed:    The following studies were reviewed today: I discussed my findings with the patient at extensive length.  Procedures   LEFT HEART CATH AND CORONARY ANGIOGRAPHY  Conclusion     Mid Cx  lesion is 90% stenosed.  Ost Cx to Prox Cx lesion is 75% stenosed.  Ost RCA to Prox RCA lesion is 80% stenosed.  Mid LAD lesion is 95% stenosed.  Prox LAD to Mid LAD lesion is 75% stenosed.  Ost 1st Diag to 1st Diag lesion is 75% stenosed.  Ost RPDA to RPDA lesion is 75% stenosed.  Ost LAD to Prox LAD lesion is 25% stenosed.  The left ventricular systolic function is normal.  LV end diastolic pressure is normal. LVEDP 3 mm Hg.  The left ventricular ejection fraction is 55-65% by visual estimate.  There is no aortic valve stenosis.   Severe three vessel CAD.  Hold Plavix.  Due to his significant accelerating angina and severe disease, will admit to hospital and start IV heparin after sheath pull.    CVTS consult.   Continue aspirin.       Recent Labs: 04/05/2018: Magnesium 2.4 04/12/2018: ALT 27; BUN 31; Creatinine, Ser 1.21; Potassium 4.0; Sodium 135 04/13/2018: Hemoglobin 9.4; Platelets 296  Recent Lipid Panel    Component Value Date/Time   CHOL 166 03/31/2018 0846   TRIG 97 03/31/2018 0846   HDL 63 03/31/2018 0846   CHOLHDL 2.6 03/31/2018 0846   VLDL 19 03/31/2018 0846   LDLCALC 84 03/31/2018 0846    Physical Exam:    VS:  BP 122/68 (BP Location: Right Arm, Patient Position: Sitting, Cuff Size: Normal)   Pulse 80   Ht 5\' 9"  (1.753 m)   Wt 143 lb (64.9 kg)   SpO2 96%   BMI 21.12 kg/m     Wt Readings from Last 3 Encounters:  04/21/18 143 lb (64.9 kg)  04/13/18 149 lb 4.8 oz (67.7 kg)  03/28/18 150 lb 9.6 oz (68.3 kg)     GEN: Patient is in no acute distress HEENT: Normal NECK: No JVD; No carotid bruits LYMPHATICS: No lymphadenopathy CARDIAC: Hear sounds regular, 2/6 systolic murmur at the apex. RESPIRATORY:  Clear to auscultation without rales, wheezing or rhonchi  ABDOMEN: Soft, non-tender, non-distended MUSCULOSKELETAL:  No edema; No deformity  SKIN: Warm and dry NEUROLOGIC:  Alert and oriented x 3 PSYCHIATRIC:  Normal affect    Signed, Jenean Lindau, MD  04/21/2018 3:38 PM    [facility]

## 2018-04-22 LAB — CBC WITH DIFFERENTIAL/PLATELET
BASOS: 1 %
Basophils Absolute: 0.1 10*3/uL (ref 0.0–0.2)
EOS (ABSOLUTE): 0.6 10*3/uL — ABNORMAL HIGH (ref 0.0–0.4)
EOS: 7 %
HEMATOCRIT: 34 % — AB (ref 37.5–51.0)
Hemoglobin: 11.6 g/dL — ABNORMAL LOW (ref 13.0–17.7)
IMMATURE GRANS (ABS): 0 10*3/uL (ref 0.0–0.1)
IMMATURE GRANULOCYTES: 0 %
LYMPHS: 13 %
Lymphocytes Absolute: 1.1 10*3/uL (ref 0.7–3.1)
MCH: 30.1 pg (ref 26.6–33.0)
MCHC: 34.1 g/dL (ref 31.5–35.7)
MCV: 88 fL (ref 79–97)
Monocytes Absolute: 0.8 10*3/uL (ref 0.1–0.9)
Monocytes: 9 %
NEUTROS PCT: 70 %
Neutrophils Absolute: 5.8 10*3/uL (ref 1.4–7.0)
Platelets: 670 10*3/uL — ABNORMAL HIGH (ref 150–450)
RBC: 3.86 x10E6/uL — ABNORMAL LOW (ref 4.14–5.80)
RDW: 14.2 % (ref 12.3–15.4)
WBC: 8.4 10*3/uL (ref 3.4–10.8)

## 2018-04-22 LAB — BASIC METABOLIC PANEL
BUN/Creatinine Ratio: 17 (ref 10–24)
BUN: 17 mg/dL (ref 8–27)
CALCIUM: 9.5 mg/dL (ref 8.6–10.2)
CO2: 25 mmol/L (ref 20–29)
CREATININE: 0.99 mg/dL (ref 0.76–1.27)
Chloride: 97 mmol/L (ref 96–106)
GFR calc Af Amer: 82 mL/min/{1.73_m2} (ref >59)
GFR, EST NON AFRICAN AMERICAN: 71 mL/min/{1.73_m2} (ref >59)
Glucose: 146 mg/dL — ABNORMAL HIGH (ref 65–99)
Potassium: 5.1 mmol/L (ref 3.5–5.2)
Sodium: 137 mmol/L (ref 134–144)

## 2018-05-01 ENCOUNTER — Telehealth: Payer: Self-pay | Admitting: Cardiology

## 2018-05-01 NOTE — Telephone Encounter (Signed)
Left voicemail for the patient's wife to call the office.

## 2018-05-01 NOTE — Telephone Encounter (Signed)
Has questions about meds because he's only on lopresser-also has a pain in his back but when he takes a deep breath it goes away

## 2018-05-02 ENCOUNTER — Other Ambulatory Visit: Payer: Self-pay

## 2018-05-02 DIAGNOSIS — I480 Paroxysmal atrial fibrillation: Secondary | ICD-10-CM

## 2018-05-02 NOTE — Telephone Encounter (Signed)
Per the wife the patient has history of a-fib post surgery. The wife states that the patient can feel his HR in his back when he is in a-fib, the patient also believes he can alter the rhythm by taking deep breaths. The wife is concerned if the patient should come in to be seen?

## 2018-05-03 ENCOUNTER — Encounter (INDEPENDENT_AMBULATORY_CARE_PROVIDER_SITE_OTHER): Payer: Medicare Other

## 2018-05-03 DIAGNOSIS — I480 Paroxysmal atrial fibrillation: Secondary | ICD-10-CM | POA: Diagnosis not present

## 2018-05-16 ENCOUNTER — Other Ambulatory Visit: Payer: Self-pay

## 2018-05-16 ENCOUNTER — Other Ambulatory Visit: Payer: Self-pay | Admitting: Surgery

## 2018-05-16 DIAGNOSIS — Z951 Presence of aortocoronary bypass graft: Secondary | ICD-10-CM

## 2018-05-16 DIAGNOSIS — I4892 Unspecified atrial flutter: Secondary | ICD-10-CM

## 2018-05-16 MED ORDER — APIXABAN 5 MG PO TABS: 5.0000 mg | ORAL_TABLET | Freq: Two times a day (BID) | ORAL | 2 refills | Status: DC

## 2018-05-17 ENCOUNTER — Ambulatory Visit (INDEPENDENT_AMBULATORY_CARE_PROVIDER_SITE_OTHER): Payer: Self-pay | Admitting: Surgery

## 2018-05-17 ENCOUNTER — Ambulatory Visit
Admission: RE | Admit: 2018-05-17 | Discharge: 2018-05-17 | Disposition: A | Discharge status: Home / Self Care | Payer: Medicare Other | Source: Ambulatory Visit | Attending: Surgery | Admitting: Surgery

## 2018-05-17 ENCOUNTER — Encounter: Payer: Self-pay | Admitting: Surgery

## 2018-05-17 ENCOUNTER — Other Ambulatory Visit: Payer: Self-pay

## 2018-05-17 VITALS — BP 128/72 | HR 68 | Resp 18 | Ht 69.0 in | Wt 142.2 lb

## 2018-05-17 DIAGNOSIS — Z951 Presence of aortocoronary bypass graft: Secondary | ICD-10-CM

## 2018-05-17 DIAGNOSIS — I4892 Unspecified atrial flutter: Secondary | ICD-10-CM | POA: Diagnosis not present

## 2018-05-17 DIAGNOSIS — I2510 Atherosclerotic heart disease of native coronary artery without angina pectoris: Secondary | ICD-10-CM

## 2018-05-17 NOTE — Progress Notes (Signed)
     HPI: Patient returns for routine postoperative follow-up having undergone coronary bypass graft surgery x5 on 04/04/2018. The patient's early postoperative recovery while in the hospital was notable for development of postoperative atrial fibrillation that converted to sinus rhythm without treatment.  Patient was discharged on metoprolol. Since hospital discharge the patient reports that he has been feeling well.  He did have some fluttering sensation in his chest and apparently had a recorder placed and was found to have further atrial fibrillation.  He was recently placed on Eliquis 5 mg twice daily by Dr. Geraldo Pitter.  He has been walking without chest pain or shortness of breath.  He is already return to mowing his yard with his riding lawnmower.   Current Outpatient Medications  Medication Sig Dispense Refill  . apixaban (ELIQUIS) 5 MG TABS tablet Take 1 tablet (5 mg total) by mouth 2 (two) times daily. 180 tablet 2  . metoprolol tartrate (LOPRESSOR) 25 MG tablet Take 1 tablet (25 mg total) by mouth 2 (two) times daily. 60 tablet 1  . Misc Natural Products (PROSTATE SUPPORT) 300-15 MG TABS Take 1 capsule by mouth daily.     No current facility-administered medications for this visit.     Physical Exam: BP 128/72 (BP Location: Left Arm, Patient Position: Sitting, Cuff Size: Normal)   Pulse 68   Resp 18   Ht 5\' 9"  (1.753 m)   Wt 142 lb 3.2 oz (64.5 kg)   SpO2 98% Comment: RA  BMI 21.00 kg/m  He looks well. Cardiac exam shows a regular rate and rhythm with normal heart sounds. His lung exam is clear. The chest incision is healing well and sternum is stable. The right leg incisions are healing well and there is no lower extremity edema.  Diagnostic Tests:  CLINICAL DATA:  Status post CABG.  EXAM: CHEST - 2 VIEW  COMPARISON:  Chest radiograph April 12, 2018  FINDINGS: Cardiomediastinal silhouette is normal. Status post median sternotomy for CABG. Mildly calcified  aortic arch. No pleural effusions or focal consolidations. Nipple shadow projecting in lung bases. Trachea projects midline and there is no pneumothorax. Soft tissue planes and included osseous structures are non-suspicious. Surgical clips LEFT neck consistent with carotid endarterectomy. Mild degenerative change of the thoracic spine.  IMPRESSION: 1. No acute cardiopulmonary process. 2.  Aortic Atherosclerosis (ICD10-I70.0).   Electronically Signed   By: Elon Alas M.D.   On: 05/17/2018 15:37   Impression:  Overall I think Mr. Puleo is making a good recovery following his surgery.  He has had some postoperative atrial fibrillation but appears to be in sinus rhythm at this time.  He is on Eliquis 5 mg twice daily.  Hopefully this is the usual postoperative atrial fibrillation and will resolve over the first couple months postoperatively.  I encouraged him to continue walking as much as possible.  I told him he could return to driving a car but should refrain lifting anything heavier than 10 pounds for 3 months postoperatively.  Plan:  He will continue to follow-up with Dr. Geraldo Pitter and will contact me if he develops any problems with his incisions.   Gaye Pollack, MD [facility] [phone]

## 2018-05-19 ENCOUNTER — Encounter: Payer: Self-pay | Admitting: Cardiology

## 2018-05-19 ENCOUNTER — Ambulatory Visit (INDEPENDENT_AMBULATORY_CARE_PROVIDER_SITE_OTHER): Payer: Medicare Other | Admitting: Cardiology

## 2018-05-19 VITALS — BP 130/72 | HR 63 | Ht 69.0 in | Wt 141.0 lb

## 2018-05-19 DIAGNOSIS — I6522 Occlusion and stenosis of left carotid artery: Secondary | ICD-10-CM

## 2018-05-19 DIAGNOSIS — Z951 Presence of aortocoronary bypass graft: Secondary | ICD-10-CM

## 2018-05-19 DIAGNOSIS — I2510 Atherosclerotic heart disease of native coronary artery without angina pectoris: Secondary | ICD-10-CM | POA: Insufficient documentation

## 2018-05-19 DIAGNOSIS — I10 Essential (primary) hypertension: Secondary | ICD-10-CM | POA: Diagnosis not present

## 2018-05-19 DIAGNOSIS — I4892 Unspecified atrial flutter: Secondary | ICD-10-CM | POA: Insufficient documentation

## 2018-05-19 HISTORY — DX: Unspecified atrial flutter: I48.92

## 2018-05-19 MED ORDER — ASPIRIN EC 81 MG PO TBEC: 81.0000 mg | DELAYED_RELEASE_TABLET | Freq: Every day | ORAL | 3 refills | Status: DC

## 2018-05-19 NOTE — Progress Notes (Signed)
Cardiology Office Note:    Date:  05/19/2018   ID:  Joseph Mcmillan, DOB Jun 30, 1934, MRN [id number]  PCP:  Joseph Flow, MD  Cardiologist:  Joseph Lindau, MD   Referring MD: Joseph Flow, MD    ASSESSMENT:    1. Paroxysmal atrial flutter (HCC)   2. Carotid artery stenosis, symptomatic, left - s/p CEA   3. Essential hypertension   4. S/P CABG x 5   5. Coronary artery disease involving native coronary artery of native heart without angina pectoris    PLAN:    In order of problems listed above:  1. Secondary prevention stressed with the patient.  Importance of compliance with diet and medication stressed and he vocalized understanding. 2. I discussed with the patient atrial flutter, disease process. Management and therapy including rate and rhythm control, anticoagulation benefits and potential risks were discussed extensively with the patient. Patient had multiple questions which were answered to patient's satisfaction. 3. I am not sure why she is not on statin therapy.  He will be back in the next few days for fasting blood work including liver lipid check and we will initiate him on a low-dose statin.  He will also now begin aspirin 81 mg daily which is coated.  I am not sure why he is not taking it. 4. Patient will be seen in follow-up appointment in 6 months or earlier if the patient has any concerns    Medication Adjustments/Labs and Tests Ordered: Current medicines are reviewed at length with the patient today.  Concerns regarding medicines are outlined above.  No orders of the defined types were placed in this encounter.  No orders of the defined types were placed in this encounter.    No chief complaint on file.    History of Present Illness:    Joseph Mcmillan is a 84 y.o. male.  Patient has known coronary artery disease post CABG surgery.  Subsequently we did an event monitor to assess his rhythm and he was found to have a paroxysm of atrial flutter.  He was  therefore initiated on anticoagulation.  He is tolerating this fine.  He denies any chest pain orthopnea or PND and he walks about a mile or more on a regular basis without any symptoms.  At the time of my evaluation, the patient is alert awake oriented and in no distress.  Past Medical History:  Diagnosis Date  . Aortic calcification (HCC)    Seen on CT scan  . BPH (benign prostatic hypertrophy)   . CAD (coronary artery disease)    Coronary artery calcification noted on CT scan  . Cataract, right eye 1994   Extracted with lens implant  . History of kidney stones   . History of TIAs 2011   Status post left carotid endarterectomy  . Hyperlipidemia   . Hypertension   . Left-sided carotid artery disease ([place]) 02/2010   Left carotid endarterectomy  . Pernicious anemia     Past Surgical History:  Procedure Laterality Date  . CARDIAC CATHETERIZATION  03/30/2018  . CAROTID ARTERY ANGIOPLASTY Left   . CAROTID ENDARTERECTOMY  03-06-2010   Left CEA  . CATARACT EXTRACTION W/ INTRAOCULAR LENS IMPLANT Right   . CHOLECYSTECTOMY    . CORONARY ARTERY BYPASS GRAFT N/A 04/04/2018   Procedure: CORONARY ARTERY BYPASS GRAFTING (CABG)TIMES 5, USING LEFT  INTERNAL MAMMARY ARTERY AND ENDOSCOPICALLY HARVESTED RIGHT AND LEFT SAPHENOUS VEIN;  Surgeon: Gaye Pollack, MD;  Location: [place];  Service: Open Heart Surgery;  Laterality: N/A;  . ENDOVEIN HARVEST OF GREATER SAPHENOUS VEIN Right 04/04/2018   Procedure: ENDOVEIN HARVEST OF GREATER SAPHENOUS VEIN;  Surgeon: Gaye Pollack, MD;  Location: [place];  Service: Open Heart Surgery;  Laterality: Right;  . LEFT HEART CATH AND CORONARY ANGIOGRAPHY N/A 03/30/2018   Procedure: LEFT HEART CATH AND CORONARY ANGIOGRAPHY;  Surgeon: Jettie Booze, MD;  Location: [facility];  Service: Cardiovascular;  Laterality: N/A;  . MELANOMA EXCISION    . TEE WITHOUT CARDIOVERSION N/A 04/04/2018   Procedure: TRANSESOPHAGEAL ECHOCARDIOGRAM (TEE);  Surgeon: Gaye Pollack, MD;  Location: [place];  Service: Open Heart Surgery;  Laterality: N/A;    Current Medications: Current Meds  Medication Sig  . apixaban (ELIQUIS) 5 MG TABS tablet Take 1 tablet (5 mg total) by mouth 2 (two) times daily.  . metoprolol tartrate (LOPRESSOR) 25 MG tablet Take 1 tablet (25 mg total) by mouth 2 (two) times daily.  . Misc Natural Products (PROSTATE SUPPORT) 300-15 MG TABS Take 1 capsule by mouth daily.     Allergies:   Codeine   Social History   Socioeconomic History  . Marital status: Married    Spouse name: Not on file  . Number of children: Not on file  . Years of education: Not on file  . Highest education level: Not on file  Occupational History  . Occupation: Retired  Scientific laboratory technician  . Financial resource strain: Not hard at all  . Food insecurity:    Worry: Never true    Inability: Never true  . Transportation needs:    Medical: No    Non-medical: No  Tobacco Use  . Smoking status: Never Smoker  . Smokeless tobacco: Never Used  Substance and Sexual Activity  . Alcohol use: No  . Drug use: No  . Sexual activity: Not on file  Lifestyle  . Physical activity:    Days per week: 0 days    Minutes per session: 0 min  . Stress: Only a little  Relationships  . Social connections:    Talks on phone: Three times a week    Gets together: Three times a week    Attends religious service: More than 4 times per year    Active member of club or organization: No    Attends meetings of clubs or organizations: Never    Relationship status: Married  Other Topics Concern  . Not on file  Social History Narrative  . Not on file     Family History: The patient's family history includes Cancer in his mother; Stroke in his father.  ROS:   Please see the history of present illness.    All other systems reviewed and are negative.  EKGs/Labs/Other Studies Reviewed:    The following studies were reviewed today: I discussed my findings with the patient at extensive  length.   Recent Labs: 04/05/2018: Magnesium 2.4 04/12/2018: ALT 27 04/21/2018: BUN 17; Creatinine, Ser 0.99; Hemoglobin 11.6; Platelets 670; Potassium 5.1; Sodium 137  Recent Lipid Panel    Component Value Date/Time   CHOL 166 03/31/2018 0846   TRIG 97 03/31/2018 0846   HDL 63 03/31/2018 0846   CHOLHDL 2.6 03/31/2018 0846   VLDL 19 03/31/2018 0846   LDLCALC 84 03/31/2018 0846    Physical Exam:    VS:  BP 130/72 (BP Location: Right Arm, Patient Position: Sitting, Cuff Size: Normal)   Pulse 63   Ht 5\' 9"  (1.753 m)  Wt 141 lb (64 kg)   SpO2 97%   BMI 20.82 kg/m     Wt Readings from Last 3 Encounters:  05/19/18 141 lb (64 kg)  05/17/18 142 lb 3.2 oz (64.5 kg)  04/21/18 143 lb (64.9 kg)     GEN: Patient is in no acute distress HEENT: Normal NECK: No JVD; No carotid bruits LYMPHATICS: No lymphadenopathy CARDIAC: Hear sounds regular, 2/6 systolic murmur at the apex. RESPIRATORY:  Clear to auscultation without rales, wheezing or rhonchi  ABDOMEN: Soft, non-tender, non-distended MUSCULOSKELETAL:  No edema; No deformity  SKIN: Warm and dry NEUROLOGIC:  Alert and oriented x 3 PSYCHIATRIC:  Normal affect   Signed, Joseph Lindau, MD  05/19/2018 3:21 PM    [facility]

## 2018-05-19 NOTE — Patient Instructions (Signed)
Medication Instructions:  Your physician has recommended you make the following change in your medication:  START 81 mg enteric coated aspirin  Labwork: Your physician recommends that you have the following labs drawn: Please come fasting next week for liver and lipid panel. No appointment needed for these labs.  Testing/Procedures: None  Follow-Up: Your physician recommends that you schedule a follow-up appointment in: 4 months  Any Other Special Instructions Will Be Listed Below (If Applicable).     If you need a refill on your cardiac medications before your next appointment, please call your pharmacy.   [place], RN, BSN

## 2018-05-24 ENCOUNTER — Other Ambulatory Visit: Payer: Self-pay | Admitting: *Deleted

## 2018-05-24 DIAGNOSIS — I6522 Occlusion and stenosis of left carotid artery: Secondary | ICD-10-CM

## 2018-05-24 LAB — BASIC METABOLIC PANEL
BUN/Creatinine Ratio: 15 (ref 10–24)
BUN: 15 mg/dL (ref 8–27)
CALCIUM: 9.4 mg/dL (ref 8.6–10.2)
CHLORIDE: 98 mmol/L (ref 96–106)
CO2: 27 mmol/L (ref 20–29)
Creatinine, Ser: 1.01 mg/dL (ref 0.76–1.27)
GFR, EST AFRICAN AMERICAN: 80 mL/min/{1.73_m2} (ref >59)
GFR, EST NON AFRICAN AMERICAN: 69 mL/min/{1.73_m2} (ref >59)
Glucose: 110 mg/dL — ABNORMAL HIGH (ref 65–99)
Potassium: 4.4 mmol/L (ref 3.5–5.2)
Sodium: 140 mmol/L (ref 134–144)

## 2018-05-24 LAB — HEPATIC FUNCTION PANEL
ALT: 12 IU/L (ref 0–44)
AST: 17 IU/L (ref 0–40)
Albumin: 4.1 g/dL (ref 3.5–4.7)
Alkaline Phosphatase: 89 IU/L (ref 39–117)
BILIRUBIN TOTAL: 0.5 mg/dL (ref 0.0–1.2)
Bilirubin, Direct: 0.14 mg/dL (ref 0.00–0.40)
Total Protein: 6.4 g/dL (ref 6.0–8.5)

## 2018-05-24 LAB — LIPID PANEL
CHOL/HDL RATIO: 3.6 ratio (ref 0.0–5.0)
Cholesterol, Total: 189 mg/dL (ref 100–199)
HDL: 53 mg/dL (ref >39)
LDL CALC: 106 mg/dL — AB (ref 0–99)
Triglycerides: 148 mg/dL (ref 0–149)
VLDL Cholesterol Cal: 30 mg/dL (ref 5–40)

## 2018-05-25 ENCOUNTER — Other Ambulatory Visit: Payer: Self-pay

## 2018-05-25 DIAGNOSIS — I2510 Atherosclerotic heart disease of native coronary artery without angina pectoris: Secondary | ICD-10-CM

## 2018-05-25 DIAGNOSIS — E7800 Pure hypercholesterolemia, unspecified: Secondary | ICD-10-CM

## 2018-05-25 MED ORDER — ATORVASTATIN CALCIUM 10 MG PO TABS: 10.0000 mg | ORAL_TABLET | Freq: Every day | ORAL | 2 refills | Status: DC

## 2018-05-28 LAB — FECAL OCCULT BLOOD, IMMUNOCHEMICAL: FECAL OCCULT BLD: NEGATIVE

## 2018-05-30 ENCOUNTER — Ambulatory Visit: Payer: Medicare Other | Admitting: Cardiology

## 2018-06-07 DIAGNOSIS — Z23 Encounter for immunization: Secondary | ICD-10-CM | POA: Diagnosis not present

## 2018-06-19 ENCOUNTER — Telehealth: Payer: Self-pay | Admitting: Cardiology

## 2018-06-19 NOTE — Telephone Encounter (Signed)
°*  STAT* If patient is at the pharmacy, call can be transferred to refill team.   1. Which medications need to be refilled? (please list name of each medication and dose if known) Metoprolol  2. Which pharmacy/location (including street and city if local pharmacy) is medication to be sent to?[facility]  3. Do they need a 30 day or 90 day supply? 90   He has many questions about what meds is he supposed to take.Marland Kitchen Please call patient before calling in the Metoprolol to make sure that he is supposed to be taking it.

## 2018-06-20 MED ORDER — METOPROLOL TARTRATE 25 MG PO TABS: 25.0000 mg | ORAL_TABLET | Freq: Two times a day (BID) | ORAL | 1 refills | Status: DC

## 2018-06-20 NOTE — Telephone Encounter (Signed)
Metoprolol tartrate 25 mg twice daily refilled. 

## 2018-07-10 ENCOUNTER — Other Ambulatory Visit: Payer: Self-pay

## 2018-07-10 DIAGNOSIS — E7800 Pure hypercholesterolemia, unspecified: Secondary | ICD-10-CM | POA: Diagnosis not present

## 2018-07-10 DIAGNOSIS — I2510 Atherosclerotic heart disease of native coronary artery without angina pectoris: Secondary | ICD-10-CM | POA: Diagnosis not present

## 2018-07-10 LAB — HEPATIC FUNCTION PANEL
ALBUMIN: 4.2 g/dL (ref 3.5–4.7)
ALT: 9 IU/L (ref 0–44)
AST: 17 IU/L (ref 0–40)
Alkaline Phosphatase: 104 IU/L (ref 39–117)
BILIRUBIN TOTAL: 0.4 mg/dL (ref 0.0–1.2)
BILIRUBIN, DIRECT: 0.12 mg/dL (ref 0.00–0.40)
TOTAL PROTEIN: 6.2 g/dL (ref 6.0–8.5)

## 2018-07-10 LAB — LIPID PANEL
CHOLESTEROL TOTAL: 143 mg/dL (ref 100–199)
Chol/HDL Ratio: 2.9 ratio (ref 0.0–5.0)
HDL: 50 mg/dL (ref >39)
LDL CALC: 73 mg/dL (ref 0–99)
TRIGLYCERIDES: 100 mg/dL (ref 0–149)
VLDL Cholesterol Cal: 20 mg/dL (ref 5–40)

## 2018-07-26 DIAGNOSIS — J40 Bronchitis, not specified as acute or chronic: Secondary | ICD-10-CM | POA: Diagnosis not present

## 2018-07-26 DIAGNOSIS — E119 Type 2 diabetes mellitus without complications: Secondary | ICD-10-CM | POA: Diagnosis not present

## 2018-07-26 DIAGNOSIS — Z6821 Body mass index (BMI) 21.0-21.9, adult: Secondary | ICD-10-CM | POA: Diagnosis not present

## 2018-07-26 DIAGNOSIS — J329 Chronic sinusitis, unspecified: Secondary | ICD-10-CM | POA: Diagnosis not present

## 2018-08-18 IMAGING — DX DG CHEST 1V PORT
1 series · 1 of 1 positions shown · non-contrast
Comparison: 04/07/2018

CLINICAL DATA: Status post coronary bypass grafting

EXAM:
PORTABLE CHEST 1 VIEW

[chest ap]
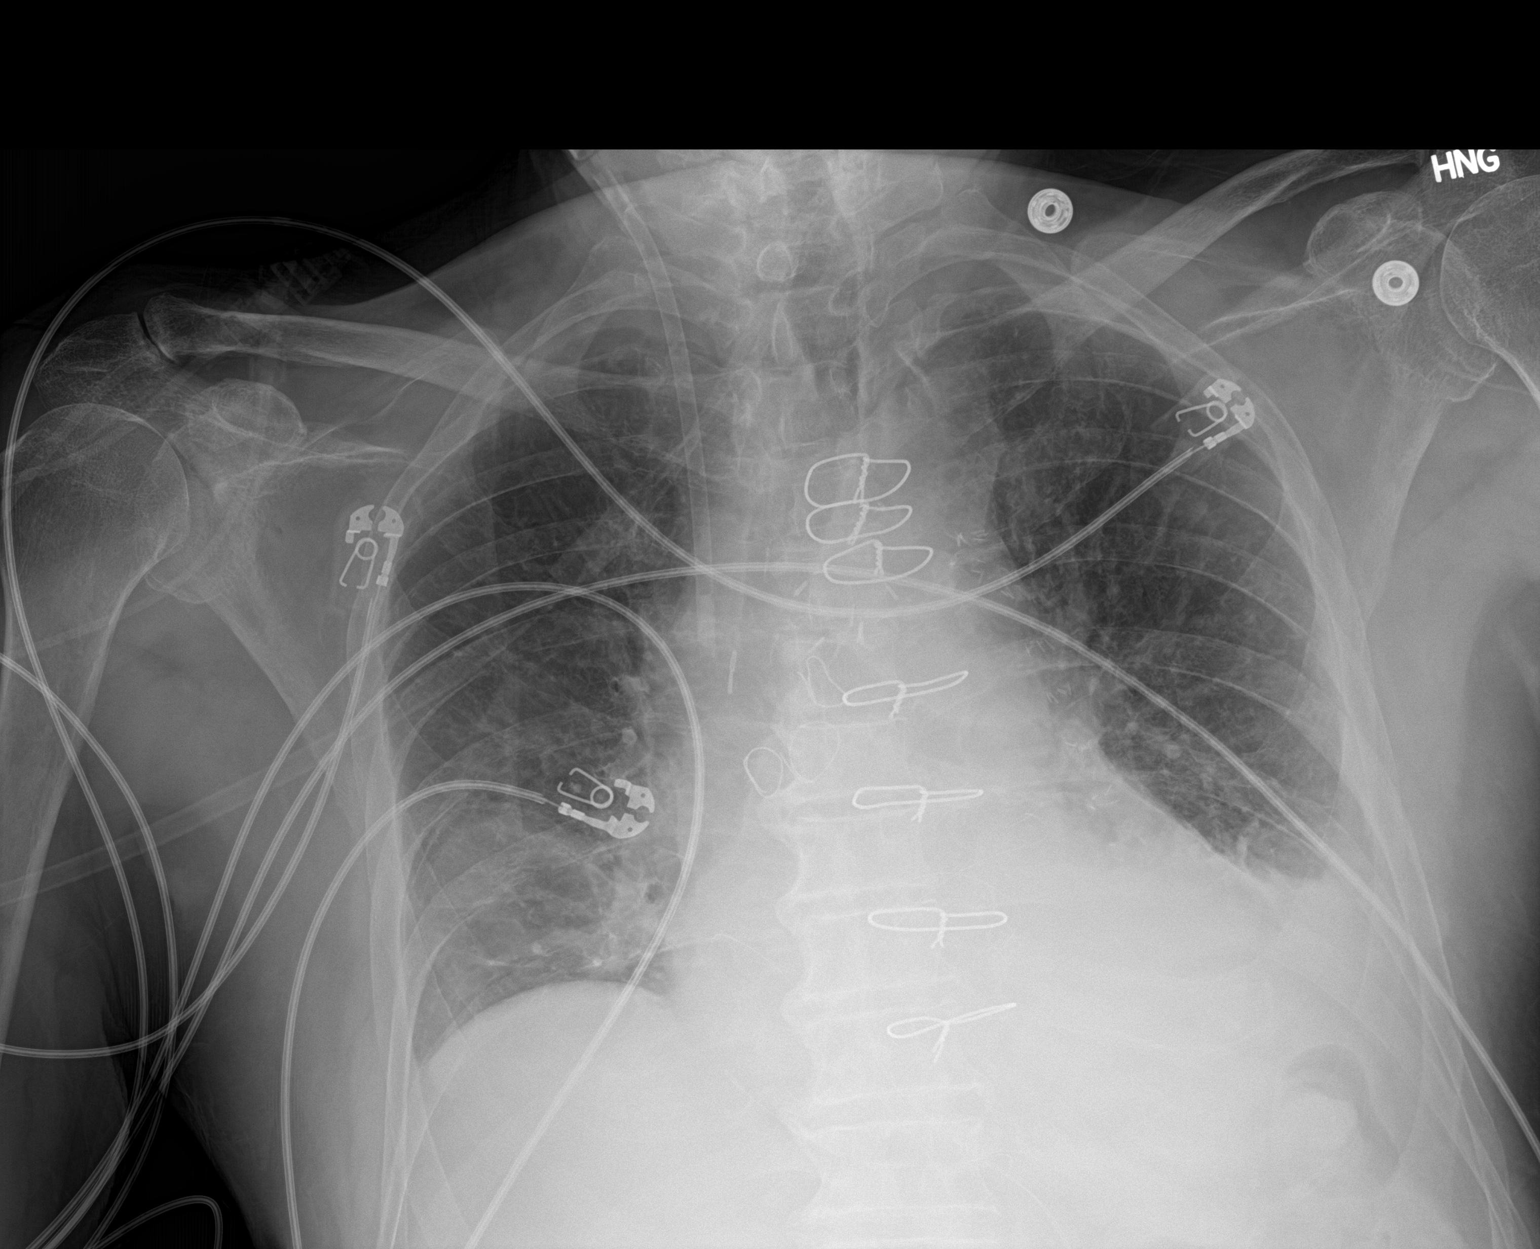

[1 of 1 positions shown; findings below may reference images not displayed]

FINDINGS: Cardiac shadow is stable. Thoracostomy catheter on the left and
mediastinal drain have been removed. Right jugular sheath remains in
place. The right lung is clear. Left basilar atelectasis and small
effusion are again noted and stable. No pneumothorax is seen. No
other focal abnormality is noted.
IMPRESSION: No pneumothorax following chest tube removal.

Persistent left basilar atelectasis and effusion are noted.

## 2018-08-22 IMAGING — CR DG CHEST 2V
2 series · 2 of 2 positions shown · non-contrast
Comparison: 04/09/2018

CLINICAL DATA: Followup CABG

EXAM:
CHEST - 2 VIEW

[chest lat]
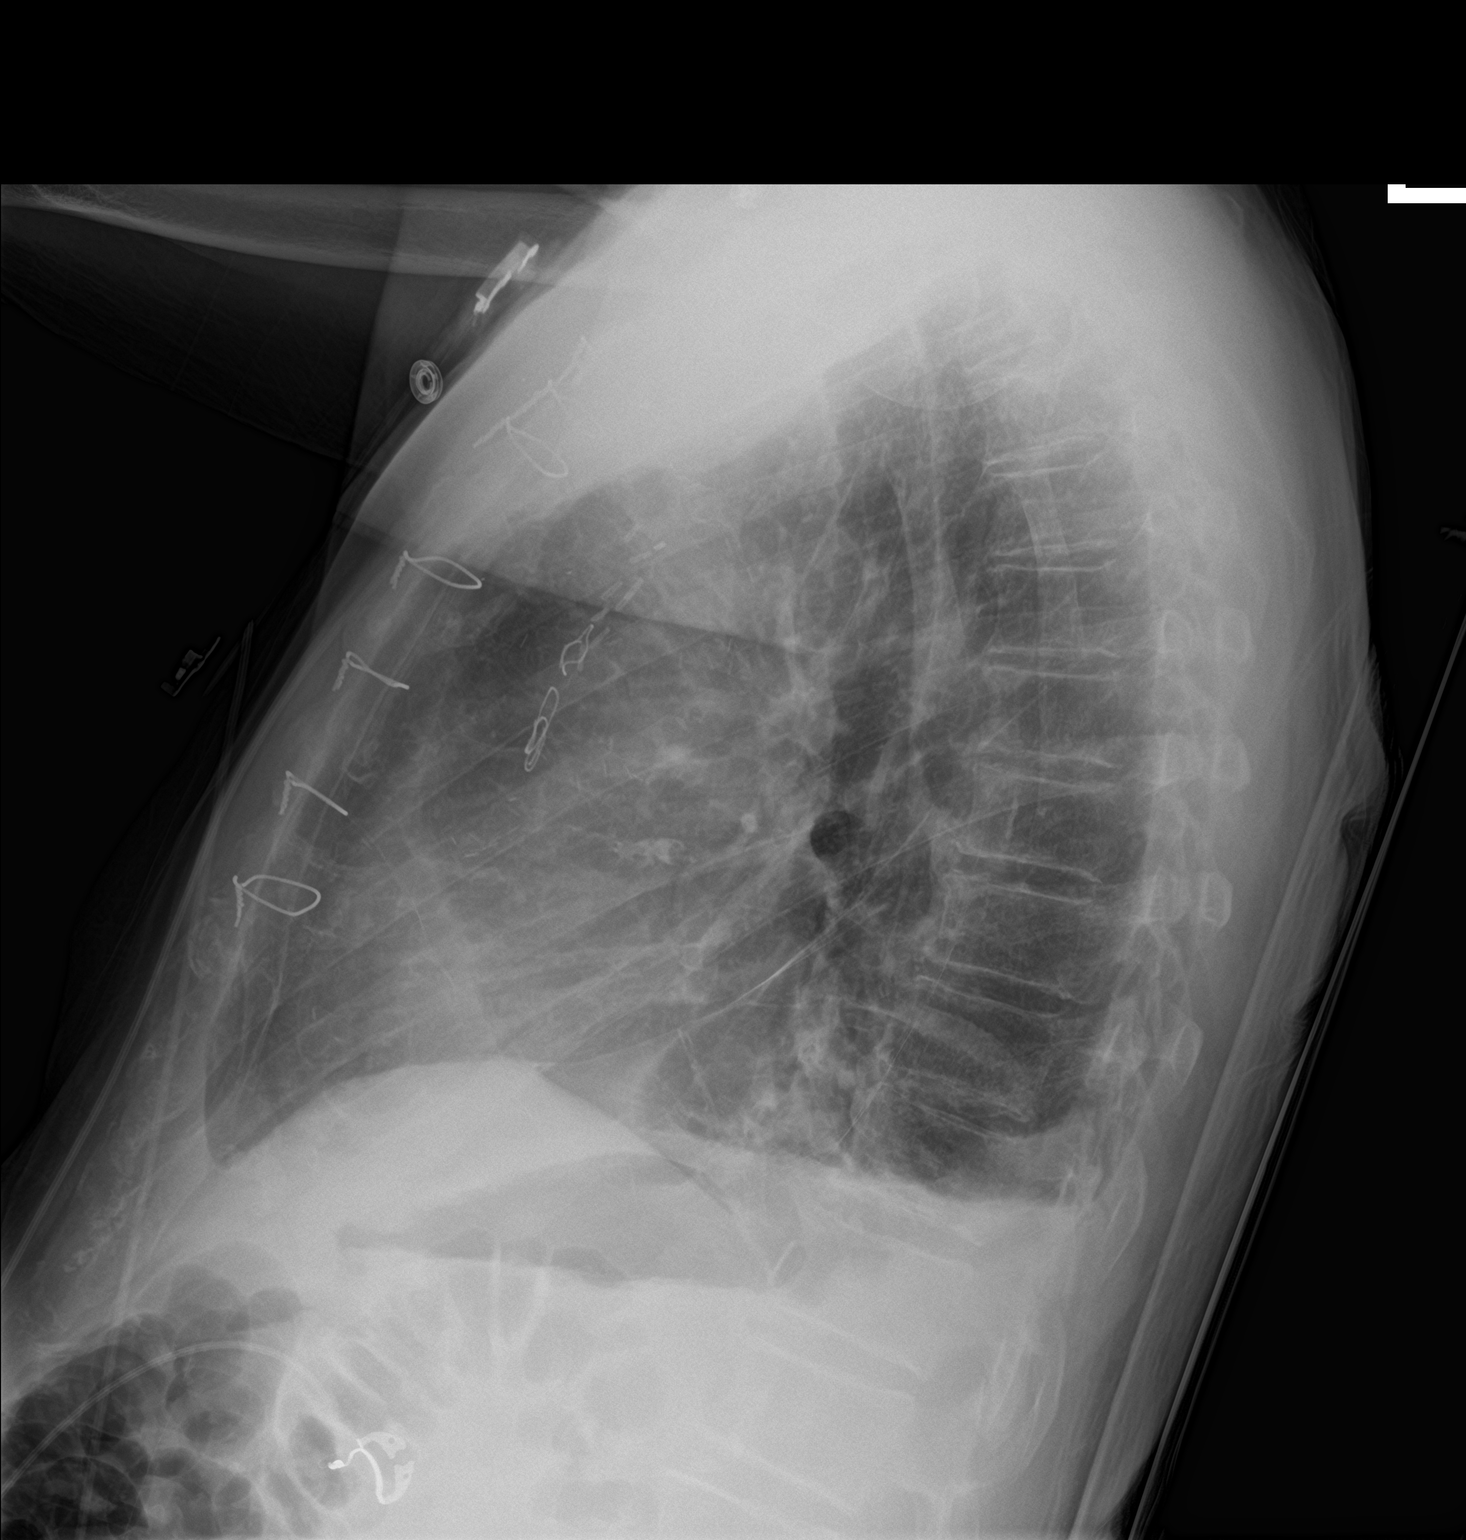

[chest ap]
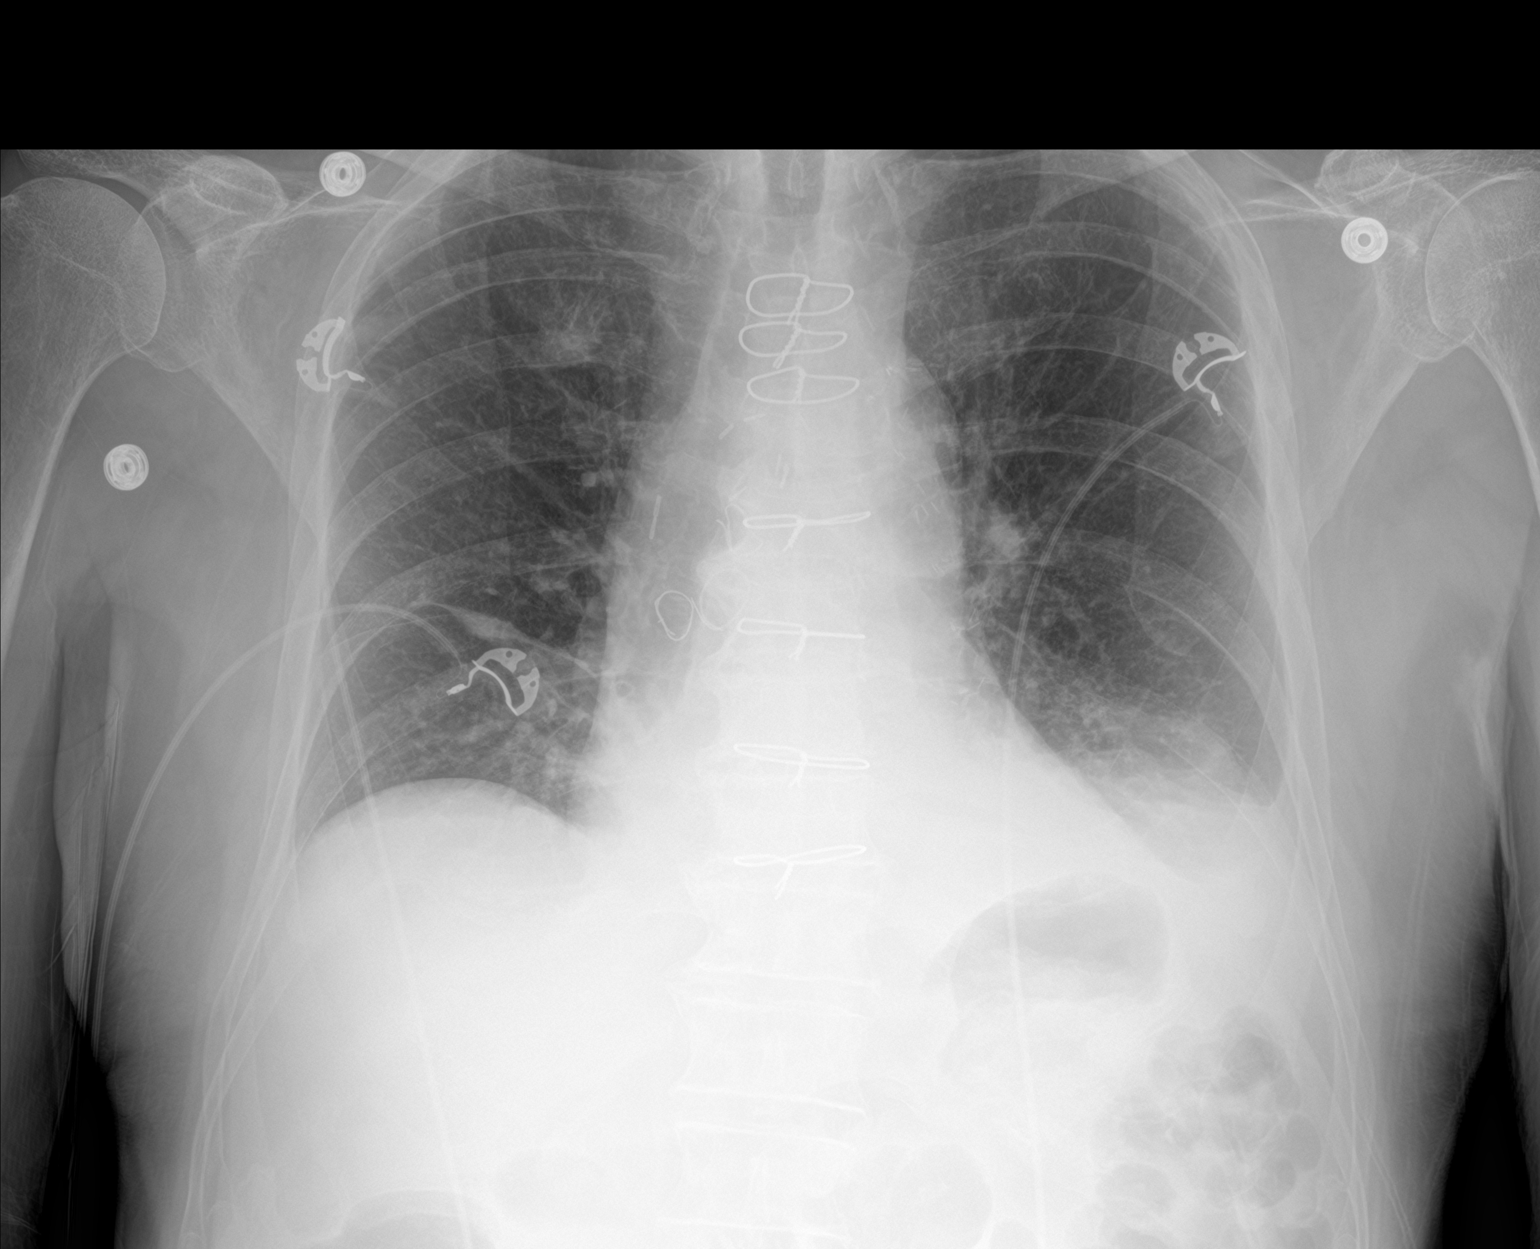

[2 of 2 positions shown; findings below may reference images not displayed]

FINDINGS: Previous median sternotomy and CABG. There are small bilateral
effusions, possibly slightly larger on the left. There is basilar
atelectasis, slightly worsened on the left. Upper lungs are clear.
IMPRESSION: Small effusions and basilar atelectasis, left worse than right. This
appears slightly more pronounced than on the study of 3 days ago.

## 2018-08-29 ENCOUNTER — Telehealth: Payer: Self-pay

## 2018-08-29 ENCOUNTER — Telehealth: Payer: Self-pay | Admitting: Cardiology

## 2018-08-29 NOTE — Telephone Encounter (Signed)
Patient's wife called and he has CABG and they noticed a knot on the leg above where he had surgery and they are concerned.  I recommended that he also call the Surgeon but haviog trouble getting thru!

## 2018-08-29 NOTE — Telephone Encounter (Signed)
Attempted to call Dr. Julien Nordmann nurse, [facility] at [facility] [phone] regarding patient's bilateral "red knots".  Will try again later.   Patient has been contacted and left voicemail messages on patient's home and cell phone regarding symptoms and also to give the office a call back.  Patient's family finally reached and appointment was made for patient to come into the office tomorrow, 08/30/2018, for a wound check.

## 2018-08-29 NOTE — Telephone Encounter (Signed)
Left message with Caryl Pina, RN Dr. Vivi Martens office regarding patient complaint asked her to please call me back regarding this.

## 2018-08-29 NOTE — Telephone Encounter (Signed)
Pl get in touch with the surgeon office and speak to nurse and have them address this asap

## 2018-08-29 NOTE — Telephone Encounter (Signed)
Patient reports he has noticed bilateral red knots in his groin area in the last 2 weeks. They are painful at the site. He has no numbness, or tingling sensation. No bruising around the site. Patient had cabg in August 2019. They have called Dr. Cyndia Bent the surgeons office regarding this but haven't heard back from the nurse yet. They would like to know what to do about this. Will route to Dr. Geraldo Pitter.

## 2018-08-30 ENCOUNTER — Ambulatory Visit: Payer: Medicare Other

## 2018-08-30 ENCOUNTER — Ambulatory Visit
Admission: RE | Admit: 2018-08-30 | Discharge: 2018-08-30 | Disposition: A | Discharge status: Home / Self Care | Payer: Medicare Other | Source: Ambulatory Visit | Attending: Physician Assistant | Admitting: Physician Assistant

## 2018-08-30 ENCOUNTER — Ambulatory Visit: Payer: Medicare Other | Admitting: Physician Assistant

## 2018-08-30 VITALS — BP 124/68 | HR 65 | Resp 20 | Ht 69.0 in | Wt 142.0 lb

## 2018-08-30 DIAGNOSIS — S79921A Unspecified injury of right thigh, initial encounter: Secondary | ICD-10-CM | POA: Diagnosis not present

## 2018-08-30 DIAGNOSIS — Z951 Presence of aortocoronary bypass graft: Secondary | ICD-10-CM | POA: Diagnosis not present

## 2018-08-30 NOTE — Telephone Encounter (Signed)
Spoke with Caryl Pina, RN with Dr. Vivi Martens office. Patient will be seen today in their office to assess this.

## 2018-08-30 NOTE — Telephone Encounter (Signed)
Please call them back if you have not heard from them yet.

## 2018-08-30 NOTE — Progress Notes (Signed)
HPI:  Mr. Joseph Mcmillan is an 85 yo white male who is S/P CABG x 5 performed on 04/04/2018.  He had bilateral endoscopic harvest of greater saphenous veins.  He contacted Dr. Ann Maki office about 48 hours ago stating he has 2 "knots" in his groin.  One on each side.  He states these are red and painful.  He presents today for evaluation.  He states in his right thigh he has an area that has been present for about 2 weeks that is painful to touch.  He states its hard and hurts when you press on it.  He denies fever and has not experienced swelling of the area.  Patient also admits he is not feeling as good as he thinks he should be.  He did not participate in cardiac rehab and states he sat around more than he shouldve after surgery.  Current Outpatient Medications  Medication Sig Dispense Refill  . apixaban (ELIQUIS) 5 MG TABS tablet Take 1 tablet (5 mg total) by mouth 2 (two) times daily. 180 tablet 2  . aspirin EC 81 MG tablet Take 1 tablet (81 mg total) by mouth daily. 90 tablet 3  . atorvastatin (LIPITOR) 10 MG tablet Take 1 tablet (10 mg total) by mouth daily. 90 tablet 2  . metoprolol tartrate (LOPRESSOR) 25 MG tablet Take 1 tablet (25 mg total) by mouth 2 (two) times daily. 180 tablet 1  . Misc Natural Products (PROSTATE SUPPORT) 300-15 MG TABS Take 1 capsule by mouth daily.     No current facility-administered medications for this visit.     Physical Exam:  BP 124/68   Pulse 65   Resp 20   Ht 5\' 9"  (1.753 m)   Wt 142 lb (64.4 kg)   SpO2 98%   BMI 20.97 kg/m   Gen: no apparent distress Heart: RRR Lungs; CTA bilaterally Right Thigh- area looks pretty benign there is about a 3-4 cm area that looks like a rash.  There is no erythema present, it is indurated on palpation.  A/P:  1. Right Thigh pain- the area looks very benign.  I do not feel evidence of a seroma or hematoma.  Patient is very concerned, he denies fever and its not erythematous, making infection less likely, will get an  [place] to assess 2. Deconditioning- patient will be referred to cardiac rehab to help him regain strength and stamina 3. Patient request new cardiologist as he is having difficulty understanding his current one.  Will arrange 4. RTC prn  Ellwood Handler, PA-C [facility] [phone]

## 2018-08-31 ENCOUNTER — Telehealth: Payer: Self-pay

## 2018-08-31 NOTE — Telephone Encounter (Signed)
Patient contacted about [place] results of right thigh.  Patient's wife, Lenna Sciara answered and given results of [place].  Stated no fluid, mass, or abnormalities per [place], [place].  She acknowledged receipt.  Patient's wife advised to contact PCP if problem does not resolve.

## 2018-09-04 DIAGNOSIS — Z85820 Personal history of malignant melanoma of skin: Secondary | ICD-10-CM | POA: Diagnosis not present

## 2018-09-04 DIAGNOSIS — L821 Other seborrheic keratosis: Secondary | ICD-10-CM | POA: Diagnosis not present

## 2018-09-04 DIAGNOSIS — L578 Other skin changes due to chronic exposure to nonionizing radiation: Secondary | ICD-10-CM | POA: Diagnosis not present

## 2018-09-04 DIAGNOSIS — L570 Actinic keratosis: Secondary | ICD-10-CM | POA: Diagnosis not present

## 2018-09-19 ENCOUNTER — Ambulatory Visit: Payer: Medicare Other | Admitting: Cardiology

## 2018-09-26 IMAGING — CR DG CHEST 2V
2 series · 2 of 2 positions shown · non-contrast
Comparison: Chest radiograph April 12, 2018

CLINICAL DATA: Status post CABG.

EXAM:
CHEST - 2 VIEW

[w chest pa]
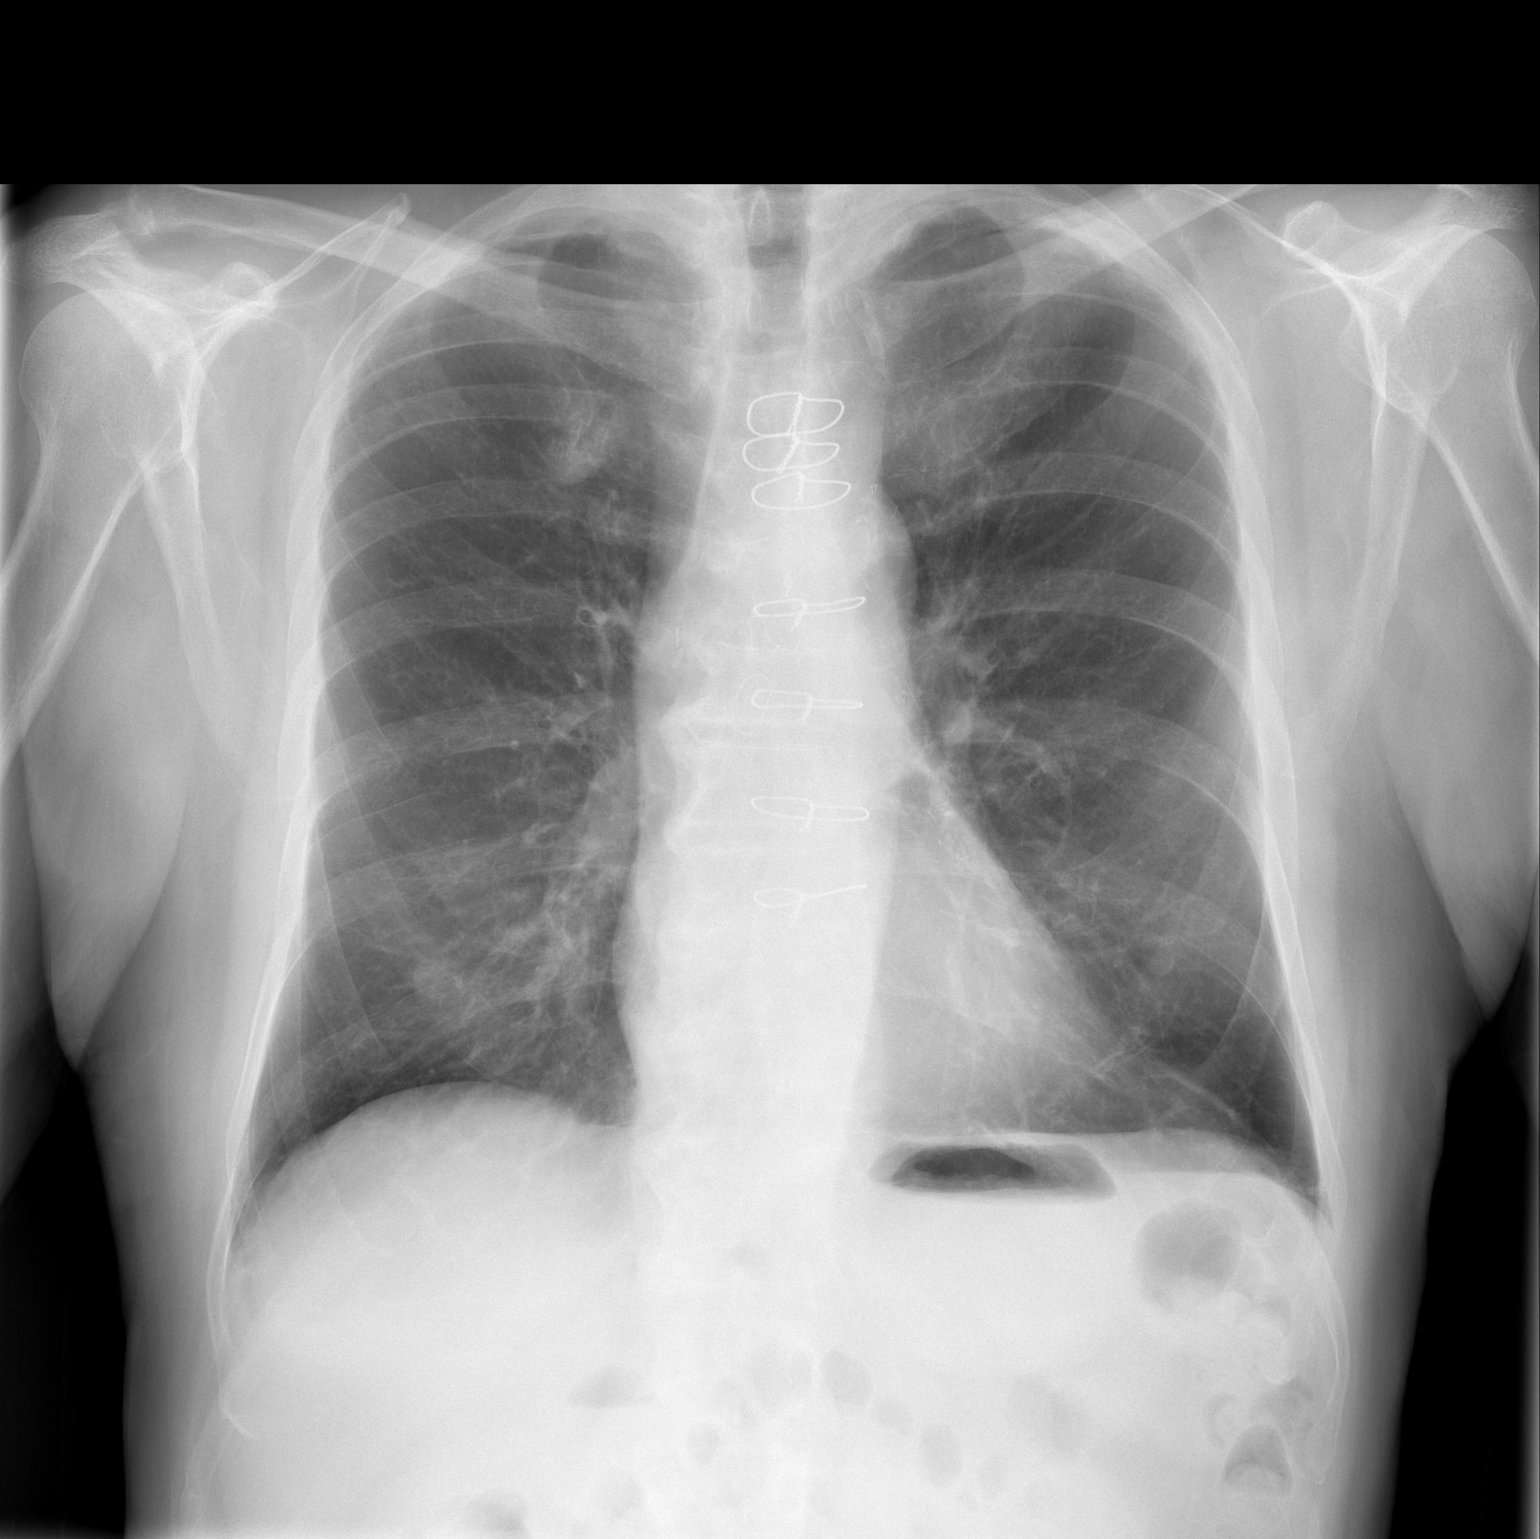

[w chest lat]
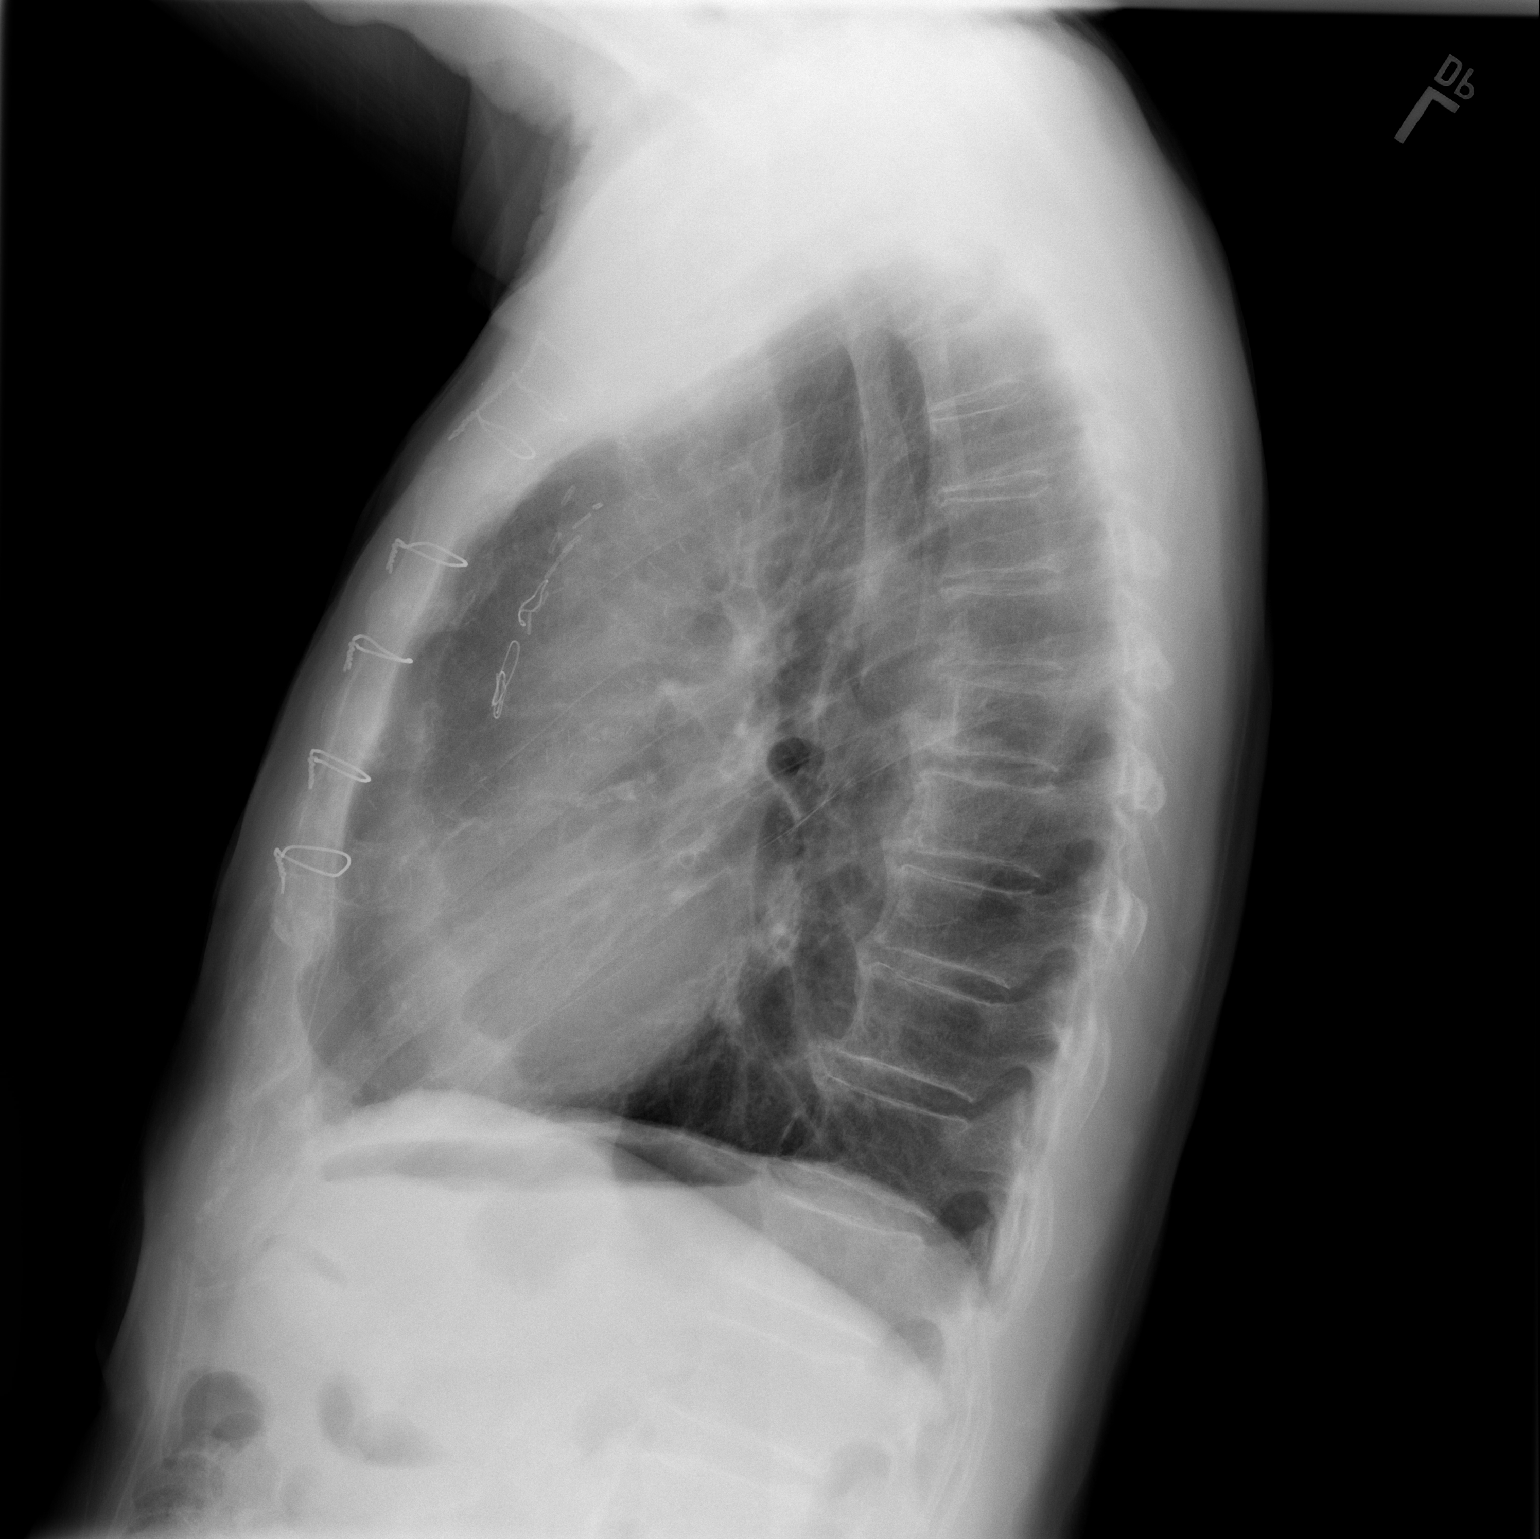

[2 of 2 positions shown; findings below may reference images not displayed]

FINDINGS: Cardiomediastinal silhouette is normal. Status post median
sternotomy for CABG. Mildly calcified aortic arch. No pleural
effusions or focal consolidations. Nipple shadow projecting in lung
bases. Trachea projects midline and there is no pneumothorax. Soft
tissue planes and included osseous structures are non-suspicious.
Surgical clips LEFT neck consistent with carotid endarterectomy.
Mild degenerative change of the thoracic spine.
IMPRESSION: 1. No acute cardiopulmonary process.
2.  Aortic Atherosclerosis ([id number]).

## 2018-10-03 ENCOUNTER — Encounter: Payer: Self-pay | Admitting: Cardiology

## 2018-10-03 ENCOUNTER — Ambulatory Visit: Payer: Medicare Other | Admitting: Cardiology

## 2018-10-03 VITALS — BP 116/70 | HR 63 | Ht 69.0 in | Wt 143.0 lb

## 2018-10-03 DIAGNOSIS — E785 Hyperlipidemia, unspecified: Secondary | ICD-10-CM | POA: Diagnosis not present

## 2018-10-03 DIAGNOSIS — I10 Essential (primary) hypertension: Secondary | ICD-10-CM

## 2018-10-03 DIAGNOSIS — Z9889 Other specified postprocedural states: Secondary | ICD-10-CM

## 2018-10-03 DIAGNOSIS — I2510 Atherosclerotic heart disease of native coronary artery without angina pectoris: Secondary | ICD-10-CM

## 2018-10-03 DIAGNOSIS — Z951 Presence of aortocoronary bypass graft: Secondary | ICD-10-CM

## 2018-10-03 NOTE — Progress Notes (Signed)
Cardiology Office Note:    Date:  10/03/2018   ID:  [name], DOB 09/12/1933, MRN [id number]  PCP:  Mateo Flow, MD  Cardiologist:  Jenean Lindau, MD   Referring MD: Mateo Flow, MD    ASSESSMENT:    1. Coronary artery disease involving native coronary artery of native heart without angina pectoris   2. Essential hypertension   3. Hyperlipidemia with target LDL less than 70   4. History of left-sided carotid endarterectomy   5. S/P CABG x 5    PLAN:    In order of problems listed above:  1. Secondary prevention stressed with the patient.  Importance of compliance with diet and medication stressed and he vocalized understanding.  His blood pressure is borderline so asked him to cut down his metoprolol to 12.5 mg twice daily in view of his symptoms.  He will also have a Chem-7 TSH and CBC today to see if there is any issue with these blood work as to why he may be having these symptoms. 2. I also asked him to get in touch with his primary care doctor about his a forementioned symptoms 3. Patient will be seen in follow-up appointment in 6 months or earlier if the patient has any concerns    Medication Adjustments/Labs and Tests Ordered: Current medicines are reviewed at length with the patient today.  Concerns regarding medicines are outlined above.  No orders of the defined types were placed in this encounter.  No orders of the defined types were placed in this encounter.    No chief complaint on file.    History of Present Illness:    Joseph Mcmillan is a 85 y.o. male.  The patient has known coronary artery and has undergone CABG surgery.  He denies any problems at this time from a cardiovascular standpoint.  He mentions to me that overall he feels tired in general.  He does not feel as energetic as in the past.  His family member accompanies him for this visit.  At the time of my evaluation, the patient is alert awake oriented and in no distress.  Past Medical  History:  Diagnosis Date  . Aortic calcification (HCC)    Seen on CT scan  . BPH (benign prostatic hypertrophy)   . CAD (coronary artery disease)    Coronary artery calcification noted on CT scan  . Cataract, right eye 1994   Extracted with lens implant  . History of kidney stones   . History of TIAs 2011   Status post left carotid endarterectomy  . Hyperlipidemia   . Hypertension   . Left-sided carotid artery disease ([place]) 02/2010   Left carotid endarterectomy  . Pernicious anemia     Past Surgical History:  Procedure Laterality Date  . CARDIAC CATHETERIZATION  03/30/2018  . CAROTID ARTERY ANGIOPLASTY Left   . CAROTID ENDARTERECTOMY  03-06-2010   Left CEA  . CATARACT EXTRACTION W/ INTRAOCULAR LENS IMPLANT Right   . CHOLECYSTECTOMY    . CORONARY ARTERY BYPASS GRAFT N/A 04/04/2018   Procedure: CORONARY ARTERY BYPASS GRAFTING (CABG)TIMES 5, USING LEFT  INTERNAL MAMMARY ARTERY AND ENDOSCOPICALLY HARVESTED RIGHT AND LEFT SAPHENOUS VEIN;  Surgeon: Gaye Pollack, MD;  Location: [place];  Service: Open Heart Surgery;  Laterality: N/A;  . ENDOVEIN HARVEST OF GREATER SAPHENOUS VEIN Right 04/04/2018   Procedure: ENDOVEIN HARVEST OF GREATER SAPHENOUS VEIN;  Surgeon: Gaye Pollack, MD;  Location: [place];  Service: Open Heart Surgery;  Laterality: Right;  . LEFT HEART CATH AND CORONARY ANGIOGRAPHY N/A 03/30/2018   Procedure: LEFT HEART CATH AND CORONARY ANGIOGRAPHY;  Surgeon: Jettie Booze, MD;  Location: [place] [facility];  Service: Cardiovascular;  Laterality: N/A;  . MELANOMA EXCISION    . TEE WITHOUT CARDIOVERSION N/A 04/04/2018   Procedure: TRANSESOPHAGEAL ECHOCARDIOGRAM (TEE);  Surgeon: Gaye Pollack, MD;  Location: [place];  Service: Open Heart Surgery;  Laterality: N/A;    Current Medications: Current Meds  Medication Sig  . apixaban (ELIQUIS) 5 MG TABS tablet Take 1 tablet (5 mg total) by mouth 2 (two) times daily.  Marland Kitchen aspirin EC 81 MG tablet Take 1 tablet (81 mg total) by  mouth daily.  . metoprolol tartrate (LOPRESSOR) 25 MG tablet Take 1 tablet (25 mg total) by mouth 2 (two) times daily.  . Misc Natural Products (PROSTATE SUPPORT) 300-15 MG TABS Take 1 capsule by mouth daily.     Allergies:   Codeine   Social History   Socioeconomic History  . Marital status: Married    Spouse name: Not on file  . Number of children: Not on file  . Years of education: Not on file  . Highest education level: Not on file  Occupational History  . Occupation: Retired  Scientific laboratory technician  . Financial resource strain: Not hard at all  . Food insecurity:    Worry: Never true    Inability: Never true  . Transportation needs:    Medical: No    Non-medical: No  Tobacco Use  . Smoking status: Never Smoker  . Smokeless tobacco: Never Used  Substance and Sexual Activity  . Alcohol use: No  . Drug use: No  . Sexual activity: Not on file  Lifestyle  . Physical activity:    Days per week: 0 days    Minutes per session: 0 min  . Stress: Only a little  Relationships  . Social connections:    Talks on phone: Three times a week    Gets together: Three times a week    Attends religious service: More than 4 times per year    Active member of club or organization: No    Attends meetings of clubs or organizations: Never    Relationship status: Married  Other Topics Concern  . Not on file  Social History Narrative  . Not on file     Family History: The patient's family history includes Cancer in his mother; Stroke in his father.  ROS:   Please see the history of present illness.    All other systems reviewed and are negative.  EKGs/Labs/Other Studies Reviewed:    The following studies were reviewed today: I discussed my findings with the patient at extensive length.   Recent Labs: 04/05/2018: Magnesium 2.4 04/21/2018: Hemoglobin 11.6; Platelets 670 05/24/2018: BUN 15; Creatinine, Ser 1.01; Potassium 4.4; Sodium 140 07/10/2018: ALT 9  Recent Lipid Panel      Component Value Date/Time   CHOL 143 07/10/2018 0941   TRIG 100 07/10/2018 0941   HDL 50 07/10/2018 0941   CHOLHDL 2.9 07/10/2018 0941   CHOLHDL 2.6 03/31/2018 0846   VLDL 19 03/31/2018 0846   LDLCALC 73 07/10/2018 0941    Physical Exam:    VS:  BP 116/70 (BP Location: Right Arm, Patient Position: Sitting, Cuff Size: Normal)   Pulse 63   Ht 5\' 9"  (1.753 m)   Wt 143 lb (64.9 kg)   SpO2 98%   BMI 21.12 kg/m  Wt Readings from Last 3 Encounters:  10/03/18 143 lb (64.9 kg)  08/30/18 142 lb (64.4 kg)  05/19/18 141 lb (64 kg)     GEN: Patient is in no acute distress HEENT: Normal NECK: No JVD; No carotid bruits LYMPHATICS: No lymphadenopathy CARDIAC: Hear sounds regular, 2/6 systolic murmur at the apex. RESPIRATORY:  Clear to auscultation without rales, wheezing or rhonchi  ABDOMEN: Soft, non-tender, non-distended MUSCULOSKELETAL:  No edema; No deformity  SKIN: Warm and dry NEUROLOGIC:  Alert and oriented x 3 PSYCHIATRIC:  Normal affect   Signed, Jenean Lindau, MD  10/03/2018 4:00 PM    [facility]

## 2018-10-03 NOTE — Patient Instructions (Signed)
Medication Instructions:   Your physician has recommended you make the following change in your medication:  CHANGE: Metoprolol 25 mg to NOW 12.5 mg twice daily    If you need a refill on your cardiac medications before your next appointment, please call your pharmacy.   Lab work:  Your physician recommends that you return for lab work today: BMP, CBC, TSH    If you have labs (blood work) drawn today and your tests are completely normal, you will receive your results only by: Marland Kitchen MyChart Message (if you have MyChart) OR . A paper copy in the mail If you have any lab test that is abnormal or we need to change your treatment, we will call you to review the results.  Testing/Procedures:  NONE  Follow-Up:  At [facility], you and your health needs are our priority.  As part of our continuing mission to provide you with exceptional heart care, we have created designated Provider Care Teams.  These Care Teams include your primary Cardiologist (physician) and Advanced Practice Providers (APPs -  Physician Assistants and Nurse Practitioners) who all work together to provide you with the care you need, when you need it.   You will need a follow up appointment in 6 months.  Please call our office 2 months in advance to schedule this appointment.

## 2018-10-04 ENCOUNTER — Telehealth: Payer: Self-pay

## 2018-10-04 DIAGNOSIS — I2510 Atherosclerotic heart disease of native coronary artery without angina pectoris: Secondary | ICD-10-CM | POA: Diagnosis not present

## 2018-10-04 DIAGNOSIS — E119 Type 2 diabetes mellitus without complications: Secondary | ICD-10-CM | POA: Diagnosis not present

## 2018-10-04 DIAGNOSIS — R5383 Other fatigue: Secondary | ICD-10-CM | POA: Diagnosis not present

## 2018-10-04 DIAGNOSIS — K5900 Constipation, unspecified: Secondary | ICD-10-CM | POA: Diagnosis not present

## 2018-10-04 DIAGNOSIS — E538 Deficiency of other specified B group vitamins: Secondary | ICD-10-CM | POA: Diagnosis not present

## 2018-10-04 LAB — CBC
HEMOGLOBIN: 13.1 g/dL (ref 13.0–17.7)
Hematocrit: 36.5 % — ABNORMAL LOW (ref 37.5–51.0)
MCH: 32.7 pg (ref 26.6–33.0)
MCHC: 35.9 g/dL — AB (ref 31.5–35.7)
MCV: 91 fL (ref 79–97)
Platelets: 260 10*3/uL (ref 150–450)
RBC: 4.01 x10E6/uL — ABNORMAL LOW (ref 4.14–5.80)
RDW: 13 % (ref 11.6–15.4)
WBC: 7.9 10*3/uL (ref 3.4–10.8)

## 2018-10-04 LAB — BASIC METABOLIC PANEL
BUN / CREAT RATIO: 18 (ref 10–24)
BUN: 22 mg/dL (ref 8–27)
CALCIUM: 9.3 mg/dL (ref 8.6–10.2)
CHLORIDE: 98 mmol/L (ref 96–106)
CO2: 23 mmol/L (ref 20–29)
CREATININE: 1.21 mg/dL (ref 0.76–1.27)
GFR calc non Af Amer: 55 mL/min/{1.73_m2} — ABNORMAL LOW (ref >59)
GFR, EST AFRICAN AMERICAN: 64 mL/min/{1.73_m2} (ref >59)
Glucose: 103 mg/dL — ABNORMAL HIGH (ref 65–99)
Potassium: 4.1 mmol/L (ref 3.5–5.2)
Sodium: 138 mmol/L (ref 134–144)

## 2018-10-04 LAB — TSH: TSH: 4.26 u[IU]/mL (ref 0.450–4.500)

## 2018-10-04 NOTE — Telephone Encounter (Signed)
Patient called and notified of test results. 

## 2018-10-04 NOTE — Telephone Encounter (Signed)
-----   Message from Jenean Lindau, MD sent at 10/04/2018  8:30 AM EST ----- The results of the study is unremarkable. Please inform patient. I will discuss in detail at next appointment. Cc  primary care/referring physician Jenean Lindau, MD 10/04/2018 8:30 AM

## 2018-10-16 ENCOUNTER — Ambulatory Visit: Payer: Medicare Other | Admitting: Gastroenterology

## 2018-10-16 ENCOUNTER — Encounter: Payer: Self-pay | Admitting: Gastroenterology

## 2018-10-16 VITALS — BP 152/82 | HR 56 | Ht 71.0 in | Wt 153.0 lb

## 2018-10-16 DIAGNOSIS — K5900 Constipation, unspecified: Secondary | ICD-10-CM | POA: Diagnosis not present

## 2018-10-16 NOTE — Patient Instructions (Signed)
If you are age 83 or older, your body mass index should be between 23-30. Your Body mass index is 21.34 kg/m. If this is out of the aforementioned range listed, please consider follow up with your Primary Care Provider.  If you are age 6 or younger, your body mass index should be between 19-25. Your Body mass index is 21.34 kg/m. If this is out of the aformentioned range listed, please consider follow up with your Primary Care Provider.   Please purchase the following medications over the counter and take as directed: Benefiber one tablespoon every morning with coffee or water.   Please call Dr. Leland Her nurse Karl Pock, RN)  in 2 weeks at [phone]  to let her now how you are doing.    Thank you,  Dr. Jackquline Denmark

## 2018-10-16 NOTE — Progress Notes (Signed)
Chief Complaint: Constipation  Referring Provider: Dr Bertram Millard       ASSESSMENT AND PLAN;  Assessment:  chronic constipation, negative colonoscopy 03/2014 except for moderate sigmoid diverticulosis.  Plan: Benefiber 1 TBS po qd. If still with problems, proceed with Linzess 72 mcg qd (samples MWF) If any further N/V, proceed with CAT followed by EGD. Increase water intake. Call in 2 weeks. Follow-up if still with problems.     HPI:   Joseph Mcmillan is a very pleasant white male with history of long-standing constipation Had recent CABG Was doing very well until recently when he had more constipation.  He currently feels much better and was going to cancel the appointment.   No melena or hematochezia.   Does not want any GI intervention.   No upper GI symptoms currently including nausea/vomiting, heartburn, regurgitation.   Past Medical History:  Diagnosis Date  . Aortic calcification (HCC)    Seen on CT scan  . BPH (benign prostatic hypertrophy)   . CAD (coronary artery disease)    Coronary artery calcification noted on CT scan  . Cataract, right eye 1994   Extracted with lens implant  . History of kidney stones   . History of TIAs 2011   Status post left carotid endarterectomy  . Hyperlipidemia   . Hypertension   . Left-sided carotid artery disease ([place]) 02/2010   Left carotid endarterectomy  . Pernicious anemia      Past Surgical History:  Procedure Laterality Date  . CARDIAC CATHETERIZATION  03/30/2018  . CAROTID ARTERY ANGIOPLASTY Left   . CAROTID ENDARTERECTOMY  03-06-2010   Left CEA  . CATARACT EXTRACTION W/ INTRAOCULAR LENS IMPLANT Right   . CHOLECYSTECTOMY    . COLONOSCOPY  04/17/2014   Moderate sigmoid diverticulosis. Otherwise, normal colonoscopy  . CORONARY ARTERY BYPASS GRAFT N/A 04/04/2018   Procedure: CORONARY ARTERY BYPASS GRAFTING (CABG)TIMES 5, USING LEFT  INTERNAL MAMMARY ARTERY AND ENDOSCOPICALLY HARVESTED RIGHT AND LEFT SAPHENOUS VEIN;   Surgeon: Gaye Pollack, MD;  Location: [place];  Service: Open Heart Surgery;  Laterality: N/A;  . ENDOVEIN HARVEST OF GREATER SAPHENOUS VEIN Right 04/04/2018   Procedure: ENDOVEIN HARVEST OF GREATER SAPHENOUS VEIN;  Surgeon: Gaye Pollack, MD;  Location: [place];  Service: Open Heart Surgery;  Laterality: Right;  . LEFT HEART CATH AND CORONARY ANGIOGRAPHY N/A 03/30/2018   Procedure: LEFT HEART CATH AND CORONARY ANGIOGRAPHY;  Surgeon: Jettie Booze, MD;  Location: [place] [facility];  Service: Cardiovascular;  Laterality: N/A;  . MELANOMA EXCISION    . TEE WITHOUT CARDIOVERSION N/A 04/04/2018   Procedure: TRANSESOPHAGEAL ECHOCARDIOGRAM (TEE);  Surgeon: Gaye Pollack, MD;  Location: [place];  Service: Open Heart Surgery;  Laterality: N/A;   Family History  Problem Relation Age of Onset  . Cancer Mother   . Stroke Father    Social History   Tobacco Use  . Smoking status: Never Smoker  . Smokeless tobacco: Never Used  Substance Use Topics  . Alcohol use: No  . Drug use: No   Current Outpatient Medications  Medication Sig Dispense Refill  . apixaban (ELIQUIS) 5 MG TABS tablet Take 1 tablet (5 mg total) by mouth 2 (two) times daily. 180 tablet 2  . aspirin EC 81 MG tablet Take 1 tablet (81 mg total) by mouth daily. 90 tablet 3  . atorvastatin (LIPITOR) 10 MG tablet Take 1 tablet (10 mg total) by mouth daily. 90 tablet 2  . metoprolol tartrate (LOPRESSOR) 25  MG tablet Take 1 tablet (25 mg total) by mouth 2 (two) times daily. 180 tablet 1  . Misc Natural Products (PROSTATE SUPPORT) 300-15 MG TABS Take 1 capsule by mouth daily.     No current facility-administered medications for this visit.    Allergies  Allergen Reactions  . Codeine Nausea And Vomiting    Review of Systems:  Constitutional: Denies fever, chills, diaphoresis, appetite change and fatigue.  HEENT: Denies photophobia, eye pain, redness, hearing loss, ear pain, congestion, sore throat, rhinorrhea, sneezing, mouth  sores, neck pain, neck stiffness and tinnitus.   Respiratory: Denies SOB, DOE, cough, chest tightness,  and wheezing.   Cardiovascular: Denies chest pain, palpitations and leg swelling.  Genitourinary: Denies dysuria, urgency, frequency, hematuria, flank pain and difficulty urinating.  Musculoskeletal: Denies myalgias, back pain, joint swelling, arthralgias and gait problem.  Skin: No rash. .  Neurological: Denies dizziness, seizures, syncope, weakness, light-headedness, numbness and headaches.  Hematological: Denies adenopathy. Easy bruising, personal or family bleeding history  Psychiatric/Behavioral: No anxiety or depression     Physical Exam:    BP (!) 152/82   Pulse (!) 56   Ht 5\' 11"  (1.803 m)   Wt 153 lb (69.4 kg)   BMI 21.34 kg/m  Constitutional:  Well-developed, in no acute distress. Psychiatric: Normal mood and affect. Behavior is normal. HEENT: Pupils normal.  Conjunctivae are normal. No scleral icterus. Neck supple.  Cardiovascular: Normal rate, regular rhythm. No edema Pulmonary/chest: Effort normal and breath sounds normal. No wheezing, rales or rhonchi. Abdominal: Soft, nondistended. Nontender. Bowel sounds active throughout. There are no masses palpable. No hepatomegaly. Rectal:    Neurological: Alert and oriented to person place and time. Skin: Skin is warm and dry. No rashes noted. CMP Latest Ref Rng & Units 10/03/2018 07/10/2018 05/24/2018  Glucose 65 - 99 mg/dL 103(H) - 110(H)  BUN 8 - 27 mg/dL 22 - 15  Creatinine 0.76 - 1.27 mg/dL 1.21 - 1.01  Sodium 134 - 144 mmol/L 138 - 140  Potassium 3.5 - 5.2 mmol/L 4.1 - 4.4  Chloride 96 - 106 mmol/L 98 - 98  CO2 20 - 29 mmol/L 23 - 27  Calcium 8.6 - 10.2 mg/dL 9.3 - 9.4  Total Protein 6.0 - 8.5 g/dL - 6.2 6.4  Total Bilirubin 0.0 - 1.2 mg/dL - 0.4 0.5  Alkaline Phos 39 - 117 IU/L - 104 89  AST 0 - 40 IU/L - 17 17  ALT 0 - 44 IU/L - 9 12     Jackquline Denmark, MD   Mateo Flow, MD

## 2019-01-05 DIAGNOSIS — E538 Deficiency of other specified B group vitamins: Secondary | ICD-10-CM | POA: Diagnosis not present

## 2019-01-05 DIAGNOSIS — E119 Type 2 diabetes mellitus without complications: Secondary | ICD-10-CM | POA: Diagnosis not present

## 2019-01-05 DIAGNOSIS — Z6822 Body mass index (BMI) 22.0-22.9, adult: Secondary | ICD-10-CM | POA: Diagnosis not present

## 2019-02-14 ENCOUNTER — Other Ambulatory Visit: Payer: Self-pay | Admitting: Cardiology

## 2019-02-14 NOTE — Telephone Encounter (Signed)
Refills sent to [facility]

## 2019-03-02 DIAGNOSIS — I6523 Occlusion and stenosis of bilateral carotid arteries: Secondary | ICD-10-CM | POA: Diagnosis not present

## 2019-03-02 DIAGNOSIS — I6521 Occlusion and stenosis of right carotid artery: Secondary | ICD-10-CM | POA: Diagnosis not present

## 2019-03-03 ENCOUNTER — Other Ambulatory Visit: Payer: Self-pay | Admitting: Cardiology

## 2019-03-08 DIAGNOSIS — H16223 Keratoconjunctivitis sicca, not specified as Sjogren's, bilateral: Secondary | ICD-10-CM | POA: Diagnosis not present

## 2019-03-08 DIAGNOSIS — H268 Other specified cataract: Secondary | ICD-10-CM | POA: Diagnosis not present

## 2019-03-08 DIAGNOSIS — H26491 Other secondary cataract, right eye: Secondary | ICD-10-CM | POA: Diagnosis not present

## 2019-03-08 DIAGNOSIS — H53012 Deprivation amblyopia, left eye: Secondary | ICD-10-CM | POA: Diagnosis not present

## 2019-03-08 DIAGNOSIS — H02831 Dermatochalasis of right upper eyelid: Secondary | ICD-10-CM | POA: Diagnosis not present

## 2019-04-23 DIAGNOSIS — R5383 Other fatigue: Secondary | ICD-10-CM | POA: Diagnosis not present

## 2019-04-23 DIAGNOSIS — E119 Type 2 diabetes mellitus without complications: Secondary | ICD-10-CM | POA: Diagnosis not present

## 2019-04-23 DIAGNOSIS — Z6822 Body mass index (BMI) 22.0-22.9, adult: Secondary | ICD-10-CM | POA: Diagnosis not present

## 2019-04-23 DIAGNOSIS — I2510 Atherosclerotic heart disease of native coronary artery without angina pectoris: Secondary | ICD-10-CM | POA: Diagnosis not present

## 2019-05-17 ENCOUNTER — Other Ambulatory Visit: Payer: Self-pay | Admitting: Cardiology

## 2019-05-22 DIAGNOSIS — L570 Actinic keratosis: Secondary | ICD-10-CM | POA: Diagnosis not present

## 2019-05-22 DIAGNOSIS — L821 Other seborrheic keratosis: Secondary | ICD-10-CM | POA: Diagnosis not present

## 2019-05-22 DIAGNOSIS — L578 Other skin changes due to chronic exposure to nonionizing radiation: Secondary | ICD-10-CM | POA: Diagnosis not present

## 2019-05-22 DIAGNOSIS — L820 Inflamed seborrheic keratosis: Secondary | ICD-10-CM | POA: Diagnosis not present

## 2019-06-08 DIAGNOSIS — Z23 Encounter for immunization: Secondary | ICD-10-CM | POA: Diagnosis not present

## 2019-08-08 DIAGNOSIS — E538 Deficiency of other specified B group vitamins: Secondary | ICD-10-CM | POA: Diagnosis not present

## 2019-08-08 DIAGNOSIS — E1159 Type 2 diabetes mellitus with other circulatory complications: Secondary | ICD-10-CM | POA: Diagnosis not present

## 2019-08-08 DIAGNOSIS — E782 Mixed hyperlipidemia: Secondary | ICD-10-CM | POA: Diagnosis not present

## 2019-08-08 DIAGNOSIS — Z79899 Other long term (current) drug therapy: Secondary | ICD-10-CM | POA: Diagnosis not present

## 2019-08-08 DIAGNOSIS — Z6823 Body mass index (BMI) 23.0-23.9, adult: Secondary | ICD-10-CM | POA: Diagnosis not present

## 2019-08-10 ENCOUNTER — Other Ambulatory Visit: Payer: Self-pay

## 2019-08-10 ENCOUNTER — Ambulatory Visit (INDEPENDENT_AMBULATORY_CARE_PROVIDER_SITE_OTHER): Payer: Medicare Other | Admitting: Cardiology

## 2019-08-10 ENCOUNTER — Encounter: Payer: Self-pay | Admitting: Cardiology

## 2019-08-10 VITALS — BP 148/78 | HR 71 | Ht 70.0 in | Wt 156.8 lb

## 2019-08-10 DIAGNOSIS — Z951 Presence of aortocoronary bypass graft: Secondary | ICD-10-CM

## 2019-08-10 DIAGNOSIS — I6522 Occlusion and stenosis of left carotid artery: Secondary | ICD-10-CM | POA: Diagnosis not present

## 2019-08-10 DIAGNOSIS — I25119 Atherosclerotic heart disease of native coronary artery with unspecified angina pectoris: Secondary | ICD-10-CM

## 2019-08-10 DIAGNOSIS — E785 Hyperlipidemia, unspecified: Secondary | ICD-10-CM | POA: Diagnosis not present

## 2019-08-10 DIAGNOSIS — I4892 Unspecified atrial flutter: Secondary | ICD-10-CM | POA: Diagnosis not present

## 2019-08-10 NOTE — Patient Instructions (Signed)
Medication Instructions:  Your physician has recommended you make the following change in your medication:  STOP: Aspirin  *If you need a refill on your cardiac medications before your next appointment, please call your pharmacy*  Lab Work: None.  If you have labs (blood work) drawn today and your tests are completely normal, you will receive your results only by: Marland Kitchen MyChart Message (if you have MyChart) OR . A paper copy in the mail If you have any lab test that is abnormal or we need to change your treatment, we will call you to review the results.  Testing/Procedures: Your physician has requested that you have an echocardiogram. Echocardiography is a painless test that uses sound waves to create images of your heart. It provides your doctor with information about the size and shape of your heart and how well your heart's chambers and valves are working. This procedure takes approximately one hour. There are no restrictions for this procedure.    Follow-Up: At [facility], you and your health needs are our priority.  As part of our continuing mission to provide you with exceptional heart care, we have created designated Provider Care Teams.  These Care Teams include your primary Cardiologist (physician) and Advanced Practice Providers (APPs -  Physician Assistants and Nurse Practitioners) who all work together to provide you with the care you need, when you need it.  Your next appointment:   6 month(s)  The format for your next appointment:   In Person  Provider:   Jenne Campus, MD  Other Instructions   Echocardiogram An echocardiogram is a procedure that uses painless sound waves (ultrasound) to produce an image of the heart. Images from an echocardiogram can provide important information about:  Signs of coronary artery disease (CAD).  Aneurysm detection. An aneurysm is a weak or damaged part of an artery wall that bulges out from the normal force of blood pumping  through the body.  Heart size and shape. Changes in the size or shape of the heart can be associated with certain conditions, including heart failure, aneurysm, and CAD.  Heart muscle function.  Heart valve function.  Signs of a past heart attack.  Fluid buildup around the heart.  Thickening of the heart muscle.  A tumor or infectious growth around the heart valves. Tell a health care provider about:  Any allergies you have.  All medicines you are taking, including vitamins, herbs, eye drops, creams, and over-the-counter medicines.  Any blood disorders you have.  Any surgeries you have had.  Any medical conditions you have.  Whether you are pregnant or may be pregnant. What are the risks? Generally, this is a safe procedure. However, problems may occur, including:  Allergic reaction to dye (contrast) that may be used during the procedure. What happens before the procedure? No specific preparation is needed. You may eat and drink normally. What happens during the procedure?   An IV tube may be inserted into one of your veins.  You may receive contrast through this tube. A contrast is an injection that improves the quality of the pictures from your heart.  A gel will be applied to your chest.  A wand-like tool (transducer) will be moved over your chest. The gel will help to transmit the sound waves from the transducer.  The sound waves will harmlessly bounce off of your heart to allow the heart images to be captured in real-time motion. The images will be recorded on a computer. The procedure may vary among  health care providers and hospitals. What happens after the procedure?  You may return to your normal, everyday life, including diet, activities, and medicines, unless your health care provider tells you not to do that. Summary  An echocardiogram is a procedure that uses painless sound waves (ultrasound) to produce an image of the heart.  Images from an  echocardiogram can provide important information about the size and shape of your heart, heart muscle function, heart valve function, and fluid buildup around your heart.  You do not need to do anything to prepare before this procedure. You may eat and drink normally.  After the echocardiogram is completed, you may return to your normal, everyday life, unless your health care provider tells you not to do that. This information is not intended to replace advice given to you by your health care provider. Make sure you discuss any questions you have with your health care provider. Document Released: 08/06/2000 Document Revised: 11/30/2018 Document Reviewed: 09/11/2016 Elsevier Patient Education  2020 [employer].

## 2019-08-10 NOTE — Progress Notes (Signed)
Cardiology Office Note:    Date:  08/10/2019   ID:  Joseph Mcmillan, DOB 30-Jan-1934, MRN [id number]  PCP:  Mateo Flow, MD  Cardiologist:  Jenne Campus, MD    Referring MD: Mateo Flow, MD   Chief Complaint  Patient presents with  . Follow-up  Doing well  History of Present Illness:    Joseph Mcmillan is a 85 y.o. male with past medical history significant for coronary artery disease, more than year ago he had coronary artery bypass graft done.  He also got paroxysmal atrial flutter.  Since that time he seems to be doing well.  [place].  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  He worked until he was 18.  Denies having any chest pain, tightness, pressure, burning in the chest.  Sometimes weak and tired exhausted but overall seems to be doing quite well.  Denies having any palpitation or dizziness  Past Medical History:  Diagnosis Date  . Aortic calcification (HCC)    Seen on CT scan  . BPH (benign prostatic hypertrophy)   . CAD (coronary artery disease)    Coronary artery calcification noted on CT scan  . Cataract, right eye 1994   Extracted with lens implant  . History of kidney stones   . History of TIAs 2011   Status post left carotid endarterectomy  . Hyperlipidemia   . Hypertension   . Left-sided carotid artery disease ([place]) 02/2010   Left carotid endarterectomy  . Pernicious anemia     Past Surgical History:  Procedure Laterality Date  . CARDIAC CATHETERIZATION  03/30/2018  . CAROTID ARTERY ANGIOPLASTY Left   . CAROTID ENDARTERECTOMY  03-06-2010   Left CEA  . CATARACT EXTRACTION W/ INTRAOCULAR LENS IMPLANT Right   . CHOLECYSTECTOMY    . COLONOSCOPY  04/17/2014   Moderate sigmoid diverticulosis. Otherwise, normal colonoscopy  . CORONARY ARTERY BYPASS GRAFT N/A 04/04/2018   Procedure: CORONARY ARTERY BYPASS GRAFTING (CABG)TIMES 5, USING LEFT  INTERNAL MAMMARY ARTERY AND ENDOSCOPICALLY HARVESTED  RIGHT AND LEFT SAPHENOUS VEIN;  Surgeon: Gaye Pollack, MD;  Location: [place];  Service: Open Heart Surgery;  Laterality: N/A;  . ENDOVEIN HARVEST OF GREATER SAPHENOUS VEIN Right 04/04/2018   Procedure: ENDOVEIN HARVEST OF GREATER SAPHENOUS VEIN;  Surgeon: Gaye Pollack, MD;  Location: [place];  Service: Open Heart Surgery;  Laterality: Right;  . LEFT HEART CATH AND CORONARY ANGIOGRAPHY N/A 03/30/2018   Procedure: LEFT HEART CATH AND CORONARY ANGIOGRAPHY;  Surgeon: Jettie Booze, MD;  Location: [place] [facility];  Service: Cardiovascular;  Laterality: N/A;  . MELANOMA EXCISION    . TEE WITHOUT CARDIOVERSION N/A 04/04/2018   Procedure: TRANSESOPHAGEAL ECHOCARDIOGRAM (TEE);  Surgeon: Gaye Pollack, MD;  Location: [place];  Service: Open Heart Surgery;  Laterality: N/A;    Current Medications: Current Meds  Medication Sig  . atorvastatin (LIPITOR) 10 MG tablet TAKE ONE (1) TABLET ONCE DAILY  . ELIQUIS 5 MG TABS tablet TAKE ONE TABLET TWICE DAILY  . metoprolol tartrate (LOPRESSOR) 25 MG tablet TAKE ONE TABLET TWICE DAILY  . [DISCONTINUED] aspirin EC 81 MG tablet Take 1 tablet (81 mg total) by mouth daily.     Allergies:   Codeine   Social History   Socioeconomic History  . Marital status: Married    Spouse name: Not on file  . Number of children: Not on file  .  Years of education: Not on file  . Highest education level: Not on file  Occupational History  . Occupation: Retired  Tobacco Use  . Smoking status: Never Smoker  . Smokeless tobacco: Never Used  Substance and Sexual Activity  . Alcohol use: No  . Drug use: No  . Sexual activity: Not on file  Other Topics Concern  . Not on file  Social History Narrative  . Not on file   Social Determinants of Health   Financial Resource Strain:   . Difficulty of Paying Living Expenses: Not on file  Food Insecurity:   . Worried About Charity fundraiser in the Last Year: Not on file  . Ran Out of Food in the Last Year: Not on  file  Transportation Needs:   . Lack of Transportation (Medical): Not on file  . Lack of Transportation (Non-Medical): Not on file  Physical Activity:   . Days of Exercise per Week: Not on file  . Minutes of Exercise per Session: Not on file  Stress:   . Feeling of Stress : Not on file  Social Connections:   . Frequency of Communication with Friends and Family: Not on file  . Frequency of Social Gatherings with Friends and Family: Not on file  . Attends Religious Services: Not on file  . Active Member of Clubs or Organizations: Not on file  . Attends Archivist Meetings: Not on file  . Marital Status: Not on file     Family History: The patient's family history includes Cancer in his mother; Stroke in his father. ROS:   Please see the history of present illness.    All 14 point review of systems negative except as described per history of present illness  EKGs/Labs/Other Studies Reviewed:      Recent Labs: 10/03/2018: BUN 22; Creatinine, Ser 1.21; Hemoglobin 13.1; Platelets 260; Potassium 4.1; Sodium 138; TSH 4.260  Recent Lipid Panel    Component Value Date/Time   CHOL 143 07/10/2018 0941   TRIG 100 07/10/2018 0941   HDL 50 07/10/2018 0941   CHOLHDL 2.9 07/10/2018 0941   CHOLHDL 2.6 03/31/2018 0846   VLDL 19 03/31/2018 0846   LDLCALC 73 07/10/2018 0941    Physical Exam:    VS:  BP (!) 148/78 (BP Location: Left Arm, Patient Position: Sitting)   Pulse 71   Ht 5\' 10"  (1.778 m)   Wt 156 lb 12.8 oz (71.1 kg)   SpO2 97%   BMI 22.50 kg/m     Wt Readings from Last 3 Encounters:  08/10/19 156 lb 12.8 oz (71.1 kg)  10/16/18 153 lb (69.4 kg)  10/03/18 143 lb (64.9 kg)     GEN:  Well nourished, well developed in no acute distress HEENT: Normal NECK: No JVD; No carotid bruits LYMPHATICS: No lymphadenopathy CARDIAC: RRR, no murmurs, no rubs, no gallops RESPIRATORY:  Clear to auscultation without rales, wheezing or rhonchi  ABDOMEN: Soft, non-tender,  non-distended MUSCULOSKELETAL:  No edema; No deformity  SKIN: Warm and dry LOWER EXTREMITIES: no swelling NEUROLOGIC:  Alert and oriented x 3 PSYCHIATRIC:  Normal affect   ASSESSMENT:    1. Coronary artery disease with angina pectoris, unspecified vessel or lesion type, unspecified whether native or transplanted heart ([place])   2. S/P CABG x 5   3. Carotid artery stenosis, symptomatic, left - s/p CEA   4. Paroxysmal atrial flutter (HCC)   5. Hyperlipidemia with target LDL less than 70    PLAN:  In order of problems listed above:  1. Coronary artery disease stable from that point review I will discontinue his aspirin.  There is no recent coronary event within.  Of last year. 2. Status post coronary bypass graft stable from that point review wound healed completely 3. Status post left carotid endarterectomy.  I will schedule him to have carotic ultrasound. 4. Paroxysmal atrial flutter, chads 2 vascular equals 3.  We will continue anticoagulation. 5. Hyperlipidemia.  Last fasting lipid profile have from year ago which was excellent.  We will schedule him to have fasting lipid profile again.   Medication Adjustments/Labs and Tests Ordered: Current medicines are reviewed at length with the patient today.  Concerns regarding medicines are outlined above.  Orders Placed This Encounter  Procedures  . EKG 12-Lead  . ECHOCARDIOGRAM COMPLETE   Medication changes: No orders of the defined types were placed in this encounter.   Signed, Park Liter, MD, [facility] 08/10/2019 5:07 PM    [place]

## 2019-08-11 ENCOUNTER — Other Ambulatory Visit: Payer: Self-pay | Admitting: Cardiology

## 2019-08-22 DIAGNOSIS — E782 Mixed hyperlipidemia: Secondary | ICD-10-CM | POA: Diagnosis not present

## 2019-08-22 DIAGNOSIS — E1159 Type 2 diabetes mellitus with other circulatory complications: Secondary | ICD-10-CM | POA: Diagnosis not present

## 2019-08-22 DIAGNOSIS — Z6823 Body mass index (BMI) 23.0-23.9, adult: Secondary | ICD-10-CM | POA: Diagnosis not present

## 2019-08-22 DIAGNOSIS — I480 Paroxysmal atrial fibrillation: Secondary | ICD-10-CM | POA: Diagnosis not present

## 2019-08-28 ENCOUNTER — Encounter: Payer: Self-pay | Admitting: Cardiology

## 2019-09-15 ENCOUNTER — Other Ambulatory Visit: Payer: Self-pay | Admitting: Cardiology

## 2019-10-09 ENCOUNTER — Ambulatory Visit (INDEPENDENT_AMBULATORY_CARE_PROVIDER_SITE_OTHER): Payer: Medicare Other

## 2019-10-09 ENCOUNTER — Other Ambulatory Visit: Payer: Self-pay

## 2019-10-09 DIAGNOSIS — I25119 Atherosclerotic heart disease of native coronary artery with unspecified angina pectoris: Secondary | ICD-10-CM | POA: Diagnosis not present

## 2019-10-09 NOTE — Progress Notes (Signed)
Complete echocardiogram has been performed.  Jimmy Montrel Donahoe, RDCS, RVT 

## 2019-10-17 ENCOUNTER — Telehealth: Payer: Self-pay | Admitting: Emergency Medicine

## 2019-10-17 DIAGNOSIS — R931 Abnormal findings on diagnostic imaging of heart and coronary circulation: Secondary | ICD-10-CM

## 2019-10-17 NOTE — Telephone Encounter (Signed)
Placed order for abdominal ultrasound already spoke with patient about this.

## 2019-10-24 ENCOUNTER — Encounter: Payer: Self-pay | Admitting: Cardiology

## 2019-10-25 ENCOUNTER — Encounter: Payer: Self-pay | Admitting: Cardiology

## 2019-10-25 DIAGNOSIS — K862 Cyst of pancreas: Secondary | ICD-10-CM | POA: Diagnosis not present

## 2019-10-25 DIAGNOSIS — R931 Abnormal findings on diagnostic imaging of heart and coronary circulation: Secondary | ICD-10-CM | POA: Diagnosis not present

## 2019-11-05 ENCOUNTER — Telehealth: Payer: Self-pay | Admitting: Cardiology

## 2019-11-05 NOTE — Telephone Encounter (Signed)
Results have been given to Dr. Harriet Masson to review since Dr. Agustin Cree is out.

## 2019-11-05 NOTE — Telephone Encounter (Signed)
Wants abdominal ultrasound results from [facility]

## 2019-11-05 NOTE — Telephone Encounter (Signed)
Called and spoke to patient wife per dpr. Advised her per Dr. Harriet Masson that his ultrasound results didn't show what they were concerned about in his inferior vena cava but however a fluid filled lesion was noted on his pancreas, wife aware of this and aware that patient should follow up with pcp regarding this. She verbally understood. No further questions.

## 2019-11-08 DIAGNOSIS — K862 Cyst of pancreas: Secondary | ICD-10-CM | POA: Diagnosis not present

## 2019-11-08 DIAGNOSIS — Z6822 Body mass index (BMI) 22.0-22.9, adult: Secondary | ICD-10-CM | POA: Diagnosis not present

## 2019-11-22 ENCOUNTER — Other Ambulatory Visit: Payer: Self-pay | Admitting: Cardiology

## 2019-11-23 NOTE — Telephone Encounter (Signed)
85 M 71.1 kg, SCr 1.10 (07/2019), LOV Shanda Bumps 12/20

## 2019-11-29 DIAGNOSIS — Z6822 Body mass index (BMI) 22.0-22.9, adult: Secondary | ICD-10-CM | POA: Diagnosis not present

## 2019-11-29 DIAGNOSIS — H6123 Impacted cerumen, bilateral: Secondary | ICD-10-CM | POA: Diagnosis not present

## 2019-11-29 DIAGNOSIS — L821 Other seborrheic keratosis: Secondary | ICD-10-CM | POA: Diagnosis not present

## 2019-11-29 DIAGNOSIS — I480 Paroxysmal atrial fibrillation: Secondary | ICD-10-CM | POA: Diagnosis not present

## 2019-12-28 ENCOUNTER — Telehealth: Payer: Self-pay | Admitting: Cardiology

## 2019-12-28 NOTE — Telephone Encounter (Signed)
Pt c/o medication issue:  1. Name of Medication: ELIQUIS 5 MG TABS tablet  2. How are you currently taking this medication (dosage and times per day)? As directed  3. Are you having a reaction (difficulty breathing--STAT)? no  4. What is your medication issue? Patient states that Dr. Agustin Cree said something about this medication at his last OV in December. He wants to know if he should continue to take it.

## 2019-12-31 NOTE — Telephone Encounter (Signed)
He does have paroxysmal atrial flutter, his chads 2 vascular equals 3.  He need to continue anticoagulation

## 2019-12-31 NOTE — Telephone Encounter (Signed)
Called patient and informed him per Dr. Agustin Cree he needs to continue medication. He has asked if Dr. Agustin Cree could call him to discuss further he doesn't want a office visit because it is $200 every time he comes. Will consult with Dr. Agustin Cree.

## 2020-01-03 DIAGNOSIS — W57XXXA Bitten or stung by nonvenomous insect and other nonvenomous arthropods, initial encounter: Secondary | ICD-10-CM | POA: Diagnosis not present

## 2020-01-03 DIAGNOSIS — S60562A Insect bite (nonvenomous) of left hand, initial encounter: Secondary | ICD-10-CM | POA: Diagnosis not present

## 2020-01-03 NOTE — Telephone Encounter (Signed)
Left message for patient to return call.

## 2020-01-03 NOTE — Telephone Encounter (Signed)
We can do virtual visit  

## 2020-01-07 NOTE — Telephone Encounter (Signed)
Called patient spoke to his wife per dpr they declined a virtual visit and prefer to be seen in person instead moved up his follow up appointment.

## 2020-02-05 ENCOUNTER — Ambulatory Visit: Payer: Medicare Other | Admitting: Cardiology

## 2020-02-05 ENCOUNTER — Encounter: Payer: Self-pay | Admitting: Cardiology

## 2020-02-05 ENCOUNTER — Other Ambulatory Visit: Payer: Self-pay

## 2020-02-05 VITALS — BP 162/78 | HR 71 | Ht 70.0 in | Wt 152.4 lb

## 2020-02-05 DIAGNOSIS — Z951 Presence of aortocoronary bypass graft: Secondary | ICD-10-CM

## 2020-02-05 DIAGNOSIS — I6522 Occlusion and stenosis of left carotid artery: Secondary | ICD-10-CM | POA: Diagnosis not present

## 2020-02-05 DIAGNOSIS — I10 Essential (primary) hypertension: Secondary | ICD-10-CM | POA: Diagnosis not present

## 2020-02-05 DIAGNOSIS — E785 Hyperlipidemia, unspecified: Secondary | ICD-10-CM | POA: Diagnosis not present

## 2020-02-05 DIAGNOSIS — I4892 Unspecified atrial flutter: Secondary | ICD-10-CM | POA: Diagnosis not present

## 2020-02-05 NOTE — Progress Notes (Signed)
Cardiology Office Note:    Date:  02/05/2020   ID:  Joseph Mcmillan, DOB 25-Aug-1933, MRN [id number]  PCP:  Mateo Flow, MD  Cardiologist:  Jenne Campus, MD    Referring MD: Mateo Flow, MD   Chief Complaint  Patient presents with  . Follow-up  Doing fine  History of Present Illness:    Joseph Mcmillan is a 86 y.o. male with past medical history significant for coronary artery bypass graft done 2 years ago, carotic endarterectomy done many years ago, essential hypertension, dyslipidemia.  Comes today to my office for follow-up overall doing well.  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  Denies have any chest pain tightness squeezing pressure burning chest.  He does get short of breath while doing things but overall considering his age and overall condition he is doing quite well.  Past Medical History:  Diagnosis Date  . Aortic calcification (HCC)    Seen on CT scan  . BPH (benign prostatic hypertrophy)   . CAD (coronary artery disease)    Coronary artery calcification noted on CT scan  . Cataract, right eye 1994   Extracted with lens implant  . History of kidney stones   . History of TIAs 2011   Status post left carotid endarterectomy  . Hyperlipidemia   . Hypertension   . Left-sided carotid artery disease ([place]) 02/2010   Left carotid endarterectomy  . Pernicious anemia     Past Surgical History:  Procedure Laterality Date  . CARDIAC CATHETERIZATION  03/30/2018  . CAROTID ARTERY ANGIOPLASTY Left   . CAROTID ENDARTERECTOMY  03-06-2010   Left CEA  . CATARACT EXTRACTION W/ INTRAOCULAR LENS IMPLANT Right   . CHOLECYSTECTOMY    . COLONOSCOPY  04/17/2014   Moderate sigmoid diverticulosis. Otherwise, normal colonoscopy  . CORONARY ARTERY BYPASS GRAFT N/A 04/04/2018   Procedure: CORONARY ARTERY BYPASS GRAFTING (CABG)TIMES 5, USING LEFT  INTERNAL MAMMARY ARTERY AND  ENDOSCOPICALLY HARVESTED RIGHT AND LEFT SAPHENOUS VEIN;  Surgeon: Gaye Pollack, MD;  Location: [place];  Service: Open Heart Surgery;  Laterality: N/A;  . ENDOVEIN HARVEST OF GREATER SAPHENOUS VEIN Right 04/04/2018   Procedure: ENDOVEIN HARVEST OF GREATER SAPHENOUS VEIN;  Surgeon: Gaye Pollack, MD;  Location: [place];  Service: Open Heart Surgery;  Laterality: Right;  . LEFT HEART CATH AND CORONARY ANGIOGRAPHY N/A 03/30/2018   Procedure: LEFT HEART CATH AND CORONARY ANGIOGRAPHY;  Surgeon: Jettie Booze, MD;  Location: [facility];  Service: Cardiovascular;  Laterality: N/A;  . MELANOMA EXCISION    . TEE WITHOUT CARDIOVERSION N/A 04/04/2018   Procedure: TRANSESOPHAGEAL ECHOCARDIOGRAM (TEE);  Surgeon: Gaye Pollack, MD;  Location: [place];  Service: Open Heart Surgery;  Laterality: N/A;    Current Medications: Current Meds  Medication Sig  . atorvastatin (LIPITOR) 10 MG tablet TAKE ONE (1) TABLET ONCE DAILY  . doxycycline (VIBRAMYCIN) 100 MG capsule Take 100 mg by mouth 2 (two) times daily.  Marland Kitchen ELIQUIS 5 MG TABS tablet TAKE ONE TABLET TWICE DAILY  . metFORMIN (GLUCOPHAGE-XR) 750 MG 24 hr tablet Take 750 mg by mouth daily.  . metoprolol tartrate (LOPRESSOR) 25 MG tablet TAKE ONE TABLET TWICE DAILY     Allergies:   Codeine   Social History   Socioeconomic History  . Marital status: Married    Spouse name: Not  on file  . Number of children: Not on file  . Years of education: Not on file  . Highest education level: Not on file  Occupational History  . Occupation: Retired  Tobacco Use  . Smoking status: Never Smoker  . Smokeless tobacco: Never Used  Vaping Use  . Vaping Use: Never used  Substance and Sexual Activity  . Alcohol use: No  . Drug use: No  . Sexual activity: Not on file  Other Topics Concern  . Not on file  Social History Narrative  . Not on file   Social Determinants of Health   Financial Resource Strain:   . Difficulty of Paying Living Expenses:     Food Insecurity:   . Worried About Charity fundraiser in the Last Year:   . Arboriculturist in the Last Year:   Transportation Needs:   . Film/video editor (Medical):   Marland Kitchen Lack of Transportation (Non-Medical):   Physical Activity:   . Days of Exercise per Week:   . Minutes of Exercise per Session:   Stress:   . Feeling of Stress :   Social Connections:   . Frequency of Communication with Friends and Family:   . Frequency of Social Gatherings with Friends and Family:   . Attends Religious Services:   . Active Member of Clubs or Organizations:   . Attends Archivist Meetings:   Marland Kitchen Marital Status:      Family History: The patient's family history includes Cancer in his mother; Stroke in his father. ROS:   Please see the history of present illness.    All 14 point review of systems negative except as described per history of present illness  EKGs/Labs/Other Studies Reviewed:      Recent Labs: No results found for requested labs within last 8760 hours.  Recent Lipid Panel    Component Value Date/Time   CHOL 143 07/10/2018 0941   TRIG 100 07/10/2018 0941   HDL 50 07/10/2018 0941   CHOLHDL 2.9 07/10/2018 0941   CHOLHDL 2.6 03/31/2018 0846   VLDL 19 03/31/2018 0846   LDLCALC 73 07/10/2018 0941    Physical Exam:    VS:  BP (!) 162/78 (BP Location: Right Arm, Patient Position: Sitting, Cuff Size: Normal)   Pulse 71   Ht 5\' 10"  (1.778 m)   Wt 152 lb 6.4 oz (69.1 kg)   SpO2 93%   BMI 21.87 kg/m     Wt Readings from Last 3 Encounters:  02/05/20 152 lb 6.4 oz (69.1 kg)  08/10/19 156 lb 12.8 oz (71.1 kg)  10/16/18 153 lb (69.4 kg)     GEN:  Well nourished, well developed in no acute distress HEENT: Normal NECK: No JVD; No carotid bruits LYMPHATICS: No lymphadenopathy CARDIAC: RRR, no murmurs, no rubs, no gallops RESPIRATORY:  Clear to auscultation without rales, wheezing or rhonchi  ABDOMEN: Soft, non-tender, non-distended MUSCULOSKELETAL:  No  edema; No deformity  SKIN: Warm and dry LOWER EXTREMITIES: no swelling NEUROLOGIC:  Alert and oriented x 3 PSYCHIATRIC:  Normal affect   ASSESSMENT:    1. S/P CABG x 5   2. Hyperlipidemia with target LDL less than 70   3. Paroxysmal atrial flutter ([place])   4. Essential hypertension   5. Carotid artery stenosis, symptomatic, left - s/p CEA    PLAN:    In order of problems listed above:  1. Status post coronary bypass graft x5.  Doing well from that point review denies  have any issue.  His EKG today showed normal sinus rhythm left axis deviation possibility of septal infarct.  We did spend time talking about medication that he is on.  He did not understand why he is taking this medication overall he does not like to take medication he would like to cut them some of his medications but he is on absolute minimum of medication that he need to be on. 2. Dyslipidemia: We will do fasting lipid profile today.  He takes only 10 mg of Lipitor.  Ideally he need to be on high intensity statin. 3. Paroxysmal atrial flutter.  Denies having a palpitation we will maintain him on anticoagulation.  His chads 2 vascular equals 4. 4. Essential hypertension: Blood pressure well controlled continue present management. 5. Status post carotid endarterectomy.  Time to recheck his carotid ultrasounds which we will do.   Medication Adjustments/Labs and Tests Ordered: Current medicines are reviewed at length with the patient today.  Concerns regarding medicines are outlined above.  No orders of the defined types were placed in this encounter.  Medication changes: No orders of the defined types were placed in this encounter.   Signed, Park Liter, MD, [facility] 02/05/2020 3:33 PM    [place]

## 2020-02-05 NOTE — Patient Instructions (Signed)
Medication Instructions:  Your physician recommends that you continue on your current medications as directed. Please refer to the Current Medication list given to you today.  *If you need a refill on your cardiac medications before your next appointment, please call your pharmacy*   Lab Work: Your physician recommends that you return for lab work today: lipid  If you have labs (blood work) drawn today and your tests are completely normal, you will receive your results only by: . MyChart Message (if you have MyChart) OR . A paper copy in the mail If you have any lab test that is abnormal or we need to change your treatment, we will call you to review the results.   Testing/Procedures: Your physician has requested that you have a carotid duplex. This test is an ultrasound of the carotid arteries in your neck. It looks at blood flow through these arteries that supply the brain with blood. Allow one hour for this exam. There are no restrictions or special instructions.     Follow-Up: At [facility], you and your health needs are our priority.  As part of our continuing mission to provide you with exceptional heart care, we have created designated Provider Care Teams.  These Care Teams include your primary Cardiologist (physician) and Advanced Practice Providers (APPs -  Physician Assistants and Nurse Practitioners) who all work together to provide you with the care you need, when you need it.  We recommend signing up for the patient portal called "MyChart".  Sign up information is provided on this After Visit Summary.  MyChart is used to connect with patients for Virtual Visits (Telemedicine).  Patients are able to view lab/test results, encounter notes, upcoming appointments, etc.  Non-urgent messages can be sent to your provider as well.   To learn more about what you can do with MyChart, go to [email or web address].    Your next appointment:   6 month(s)  The format for your next  appointment:   In Person  Provider:   Robert Krasowski, MD   Other Instructions    

## 2020-02-06 LAB — LIPID PANEL
Chol/HDL Ratio: 2.4 ratio (ref 0.0–5.0)
Cholesterol, Total: 139 mg/dL (ref 100–199)
HDL: 59 mg/dL (ref >39)
LDL Chol Calc (NIH): 61 mg/dL (ref 0–99)
Triglycerides: 104 mg/dL (ref 0–149)
VLDL Cholesterol Cal: 19 mg/dL (ref 5–40)

## 2020-02-08 DIAGNOSIS — K862 Cyst of pancreas: Secondary | ICD-10-CM | POA: Diagnosis not present

## 2020-02-18 ENCOUNTER — Ambulatory Visit: Payer: Medicare Other | Admitting: Cardiology

## 2020-02-19 ENCOUNTER — Ambulatory Visit (INDEPENDENT_AMBULATORY_CARE_PROVIDER_SITE_OTHER): Payer: Medicare Other

## 2020-02-19 ENCOUNTER — Other Ambulatory Visit: Payer: Self-pay

## 2020-02-19 DIAGNOSIS — I6522 Occlusion and stenosis of left carotid artery: Secondary | ICD-10-CM | POA: Diagnosis not present

## 2020-02-19 DIAGNOSIS — E785 Hyperlipidemia, unspecified: Secondary | ICD-10-CM

## 2020-02-19 NOTE — Progress Notes (Signed)
Carotid artery duplex exam performed.  Jimmy Brodan Grewell RDCS, RVT

## 2020-03-11 DIAGNOSIS — H02831 Dermatochalasis of right upper eyelid: Secondary | ICD-10-CM | POA: Diagnosis not present

## 2020-03-11 DIAGNOSIS — H2589 Other age-related cataract: Secondary | ICD-10-CM | POA: Diagnosis not present

## 2020-03-11 DIAGNOSIS — H16223 Keratoconjunctivitis sicca, not specified as Sjogren's, bilateral: Secondary | ICD-10-CM | POA: Diagnosis not present

## 2020-03-11 DIAGNOSIS — H52201 Unspecified astigmatism, right eye: Secondary | ICD-10-CM | POA: Diagnosis not present

## 2020-03-11 DIAGNOSIS — H26491 Other secondary cataract, right eye: Secondary | ICD-10-CM | POA: Diagnosis not present

## 2020-03-11 DIAGNOSIS — H524 Presbyopia: Secondary | ICD-10-CM | POA: Diagnosis not present

## 2020-03-11 DIAGNOSIS — H53012 Deprivation amblyopia, left eye: Secondary | ICD-10-CM | POA: Diagnosis not present

## 2020-03-11 DIAGNOSIS — H5201 Hypermetropia, right eye: Secondary | ICD-10-CM | POA: Diagnosis not present

## 2020-03-21 DIAGNOSIS — E1165 Type 2 diabetes mellitus with hyperglycemia: Secondary | ICD-10-CM | POA: Diagnosis not present

## 2020-03-21 DIAGNOSIS — I480 Paroxysmal atrial fibrillation: Secondary | ICD-10-CM | POA: Diagnosis not present

## 2020-03-21 DIAGNOSIS — T63481A Toxic effect of venom of other arthropod, accidental (unintentional), initial encounter: Secondary | ICD-10-CM | POA: Diagnosis not present

## 2020-03-21 DIAGNOSIS — E1159 Type 2 diabetes mellitus with other circulatory complications: Secondary | ICD-10-CM | POA: Diagnosis not present

## 2020-03-21 DIAGNOSIS — Z6822 Body mass index (BMI) 22.0-22.9, adult: Secondary | ICD-10-CM | POA: Diagnosis not present

## 2020-04-01 ENCOUNTER — Other Ambulatory Visit: Payer: Self-pay | Admitting: Cardiology

## 2020-05-15 ENCOUNTER — Telehealth: Payer: Self-pay | Admitting: Cardiology

## 2020-05-15 NOTE — Telephone Encounter (Signed)
Patient calling the office for samples of medication:   1.  What medication and dosage are you requesting samples for? ELIQUIS 5 MG TABS tablet  2.  Are you currently out of this medication? No, 3 or 4 days left

## 2020-05-15 NOTE — Telephone Encounter (Signed)
Called patient. Informed his wife per dpr we could do a couple samples but long term we are not supposed to and patient will need to apply for patient assistance if unable to afford. He reports he will not apply because he thinks he will get denied. Samples ready in [place] office.

## 2020-05-31 ENCOUNTER — Other Ambulatory Visit: Payer: Self-pay | Admitting: Cardiology

## 2020-06-05 DIAGNOSIS — Z23 Encounter for immunization: Secondary | ICD-10-CM | POA: Diagnosis not present

## 2020-06-21 DIAGNOSIS — E1165 Type 2 diabetes mellitus with hyperglycemia: Secondary | ICD-10-CM | POA: Diagnosis not present

## 2020-06-21 DIAGNOSIS — I10 Essential (primary) hypertension: Secondary | ICD-10-CM | POA: Diagnosis not present

## 2020-06-21 DIAGNOSIS — I480 Paroxysmal atrial fibrillation: Secondary | ICD-10-CM | POA: Diagnosis not present

## 2020-06-21 DIAGNOSIS — E538 Deficiency of other specified B group vitamins: Secondary | ICD-10-CM | POA: Diagnosis not present

## 2020-07-25 DIAGNOSIS — I10 Essential (primary) hypertension: Secondary | ICD-10-CM | POA: Insufficient documentation

## 2020-07-25 DIAGNOSIS — Z87442 Personal history of urinary calculi: Secondary | ICD-10-CM | POA: Insufficient documentation

## 2020-07-25 DIAGNOSIS — D510 Vitamin B12 deficiency anemia due to intrinsic factor deficiency: Secondary | ICD-10-CM | POA: Insufficient documentation

## 2020-07-25 DIAGNOSIS — I700 Atherosclerosis of aorta: Secondary | ICD-10-CM | POA: Insufficient documentation

## 2020-07-25 DIAGNOSIS — E785 Hyperlipidemia, unspecified: Secondary | ICD-10-CM | POA: Insufficient documentation

## 2020-07-28 ENCOUNTER — Encounter: Payer: Self-pay | Admitting: Cardiology

## 2020-07-28 ENCOUNTER — Ambulatory Visit: Payer: Medicare Other | Admitting: Cardiology

## 2020-07-28 ENCOUNTER — Other Ambulatory Visit: Payer: Self-pay

## 2020-07-28 VITALS — BP 132/68 | HR 81 | Ht 69.0 in | Wt 150.0 lb

## 2020-07-28 DIAGNOSIS — Z951 Presence of aortocoronary bypass graft: Secondary | ICD-10-CM

## 2020-07-28 DIAGNOSIS — I6522 Occlusion and stenosis of left carotid artery: Secondary | ICD-10-CM | POA: Diagnosis not present

## 2020-07-28 DIAGNOSIS — E782 Mixed hyperlipidemia: Secondary | ICD-10-CM | POA: Diagnosis not present

## 2020-07-28 DIAGNOSIS — I10 Essential (primary) hypertension: Secondary | ICD-10-CM | POA: Diagnosis not present

## 2020-07-28 DIAGNOSIS — I4892 Unspecified atrial flutter: Secondary | ICD-10-CM | POA: Diagnosis not present

## 2020-07-28 MED ORDER — RANOLAZINE ER 500 MG PO TB12: 500.0000 mg | ORAL_TABLET | Freq: Two times a day (BID) | ORAL | 2 refills | Status: DC

## 2020-07-28 NOTE — Progress Notes (Signed)
Cardiology Office Note:    Date:  07/28/2020   ID:  Joseph Mcmillan, DOB 06/07/1934, MRN [id number]  PCP:  Mateo Flow, MD  Cardiologist:  Jenne Campus, MD    Referring MD: Mateo Flow, MD   Chief Complaint  Patient presents with  . Mild chest pain  . Fatigue  Doing fine but I do have some chest pain  History of Present Illness:    Joseph Mcmillan is a 86 y.o. male with past medical history significant for coronary artery disease status post coronary bypass grafting 2 years ago, status post left carotid endarterectomy done many years ago, essential hypertension, dyslipidemia. 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 He comes today 2 months for follow-up initially he tells me that everything is fine, he is hard of hearing so conversation somewhat difficult, however, he tells me that he does have some chest pain pain happen at different situations sometimes when he lays down sometimes when he walks last for few minutes he cannot cure to this sensation. There is no shortness of breath no sweating associated with it. Also describes duration 1 time he woke up in the morning he had difficulty squeezing his right arm that lasted for few minutes and then everything came back normal. No dizziness no passing out.  Past Medical History:  Diagnosis Date  . Aortic calcification (HCC)    Seen on CT scan  . Blepharitis of upper and lower eyelids of both eyes 11/29/2017  . BPH (benign prostatic hypertrophy)   . CAD (coronary artery disease)    Coronary artery calcification noted on CT scan  . Carotid artery stenosis, symptomatic, left - s/p CEA 07/26/2017  . Cataract, right eye 1994   Extracted with lens implant  . Deprivation amblyopia 08/24/2011  . Dermatochalasis 08/24/2011  . Essential hypertension 07/26/2017  . Fatigue due to excessive exertion 07/26/2017  . History of kidney  stones   . History of left-sided carotid endarterectomy 07/26/2017  . History of TIAs 2011   Status post left carotid endarterectomy  . Hyperlipidemia   . Hyperlipidemia with target LDL less than 70 07/26/2017  . Hypertension   . Keratoconjunctivitis sicca of both eyes not specified as Sjogren's 11/29/2017  . Left-sided carotid artery disease ([place]) 02/2010   Left carotid endarterectomy  . Malnutrition of moderate degree 04/10/2018  . Mature cataract 08/24/2011  . Occlusion and stenosis of carotid artery without mention of cerebral infarction 10/30/2012  . Paroxysmal atrial flutter ([place]) 05/19/2018  . Pernicious anemia   . Posterior capsular opacification of right eye, not obscuring vision 08/24/2011  . Posterior vitreous detachment 08/24/2011  . Presbyopia of both eyes 03/01/2017  . Pseudophakia 08/24/2011  . S/P CABG x 5 04/04/2018  . Weakness of both lower extremities 07/26/2017    Past Surgical History:  Procedure Laterality Date  . CARDIAC CATHETERIZATION  03/30/2018  . CAROTID ARTERY ANGIOPLASTY Left   . CAROTID ENDARTERECTOMY  03-06-2010   Left CEA  . CATARACT EXTRACTION W/ INTRAOCULAR LENS IMPLANT Right   . CHOLECYSTECTOMY    . COLONOSCOPY  04/17/2014   Moderate sigmoid diverticulosis. Otherwise, normal colonoscopy  . CORONARY ARTERY BYPASS GRAFT N/A 04/04/2018   Procedure: CORONARY ARTERY BYPASS GRAFTING (CABG)TIMES 5, USING LEFT  INTERNAL MAMMARY ARTERY AND ENDOSCOPICALLY HARVESTED RIGHT AND LEFT SAPHENOUS VEIN;  Surgeon: Gaye Pollack, MD;  Location: [facility];  Service: Open Heart Surgery;  Laterality: N/A;  . ENDOVEIN HARVEST OF GREATER SAPHENOUS VEIN Right 04/04/2018   Procedure: ENDOVEIN HARVEST OF GREATER SAPHENOUS VEIN;  Surgeon: Gaye Pollack, MD;  Location: [place];  Service: Open Heart Surgery;  Laterality: Right;  . LEFT HEART CATH AND CORONARY ANGIOGRAPHY N/A 03/30/2018   Procedure: LEFT HEART CATH AND CORONARY ANGIOGRAPHY;  Surgeon: Jettie Booze, MD;  Location: [place]  [facility];  Service: Cardiovascular;  Laterality: N/A;  . MELANOMA EXCISION    . TEE WITHOUT CARDIOVERSION N/A 04/04/2018   Procedure: TRANSESOPHAGEAL ECHOCARDIOGRAM (TEE);  Surgeon: Gaye Pollack, MD;  Location: [place];  Service: Open Heart Surgery;  Laterality: N/A;    Current Medications: Current Meds  Medication Sig  . atorvastatin (LIPITOR) 10 MG tablet TAKE ONE (1) TABLET ONCE DAILY  . ELIQUIS 5 MG TABS tablet TAKE ONE TABLET TWICE DAILY  . metFORMIN (GLUCOPHAGE-XR) 750 MG 24 hr tablet Take 750 mg by mouth daily.  . metoprolol tartrate (LOPRESSOR) 25 MG tablet TAKE ONE TABLET TWICE DAILY     Allergies:   Codeine   Social History   Socioeconomic History  . Marital status: Married    Spouse name: Not on file  . Number of children: Not on file  . Years of education: Not on file  . Highest education level: Not on file  Occupational History  . Occupation: Retired  Tobacco Use  . Smoking status: Never Smoker  . Smokeless tobacco: Never Used  Vaping Use  . Vaping Use: Never used  Substance and Sexual Activity  . Alcohol use: No  . Drug use: No  . Sexual activity: Not on file  Other Topics Concern  . Not on file  Social History Narrative  . Not on file   Social Determinants of Health   Financial Resource Strain:   . Difficulty of Paying Living Expenses: Not on file  Food Insecurity:   . Worried About Charity fundraiser in the Last Year: Not on file  . Ran Out of Food in the Last Year: Not on file  Transportation Needs:   . Lack of Transportation (Medical): Not on file  . Lack of Transportation (Non-Medical): Not on file  Physical Activity:   . Days of Exercise per Week: Not on file  . Minutes of Exercise per Session: Not on file  Stress:   . Feeling of Stress : Not on file  Social Connections:   . Frequency of Communication with Friends and Family: Not on file  . Frequency of Social Gatherings with Friends and Family: Not on file  . Attends Religious Services:  Not on file  . Active Member of Clubs or Organizations: Not on file  . Attends Archivist Meetings: Not on file  . Marital Status: Not on file     Family History: The patient's family history includes Cancer in his mother; Stroke in his father. ROS:   Please see the history of present illness.    All 14 point review of systems negative except as described per history of present illness  EKGs/Labs/Other Studies Reviewed:      Recent Labs: No results found for requested labs within last 8760 hours.  Recent Lipid Panel    Component Value Date/Time   CHOL 139 02/05/2020 1549   TRIG 104 02/05/2020 1549   HDL 59 02/05/2020 1549   CHOLHDL 2.4 02/05/2020 1549   CHOLHDL 2.6 03/31/2018 0846   VLDL 19  03/31/2018 0846   LDLCALC 61 02/05/2020 1549    Physical Exam:    VS:  BP 132/68 (BP Location: Left Arm, Patient Position: Sitting)   Pulse 81   Ht 5\' 9"  (1.753 m)   Wt 150 lb (68 kg)   SpO2 97%   BMI 22.15 kg/m     Wt Readings from Last 3 Encounters:  07/28/20 150 lb (68 kg)  02/05/20 152 lb 6.4 oz (69.1 kg)  08/10/19 156 lb 12.8 oz (71.1 kg)     GEN:  Well nourished, well developed in no acute distress HEENT: Normal NECK: No JVD; No carotid bruits LYMPHATICS: No lymphadenopathy CARDIAC: RRR, no murmurs, no rubs, no gallops RESPIRATORY:  Clear to auscultation without rales, wheezing or rhonchi  ABDOMEN: Soft, non-tender, non-distended MUSCULOSKELETAL:  No edema; No deformity  SKIN: Warm and dry LOWER EXTREMITIES: no swelling NEUROLOGIC:  Alert and oriented x 3 PSYCHIATRIC:  Normal affect   ASSESSMENT:    1. S/P CABG x 5   2. Essential hypertension   3. Paroxysmal atrial flutter ([place])   4. Mixed hyperlipidemia   5. Carotid artery stenosis, symptomatic, left - s/p CEA    PLAN:    In order of problems listed above:  1. Status post coronary bypass graft he does have some chest pain somewhat atypical however I will schedule him to have [place]. I will  start ranolazine 5 mg twice daily to his medical regimen. 2. Essential hypertension: Blood pressure well controlled continue present management. 3. Paroxysmal atrial flutter denies have any palpitation he is anticoagulated which I will continue. 4. Mixed dyslipidemia: I did review his fasting lipid profile from [facility] which show me LDL of 61 HDL 59 this is 02/05/2020. He is on moderate intensity statin form of Lipitor 10 mg daily which I will continue. 5. Status post carotic artery stenting done many years ago on the left side last ultrasound did not show critical stenosis we will continue monitoring.   Medication Adjustments/Labs and Tests Ordered: Current medicines are reviewed at length with the patient today.  Concerns regarding medicines are outlined above.  No orders of the defined types were placed in this encounter.  Medication changes: No orders of the defined types were placed in this encounter.   Signed, Park Liter, MD, [facility] 07/28/2020 3:32 PM    [place]

## 2020-07-28 NOTE — Patient Instructions (Signed)
Medication Instructions:  Your physician has recommended you make the following change in your medication:  START: Ranolazine 500 mg twice daily   *If you need a refill on your cardiac medications before your next appointment, please call your pharmacy*   Lab Work: None If you have labs (blood work) drawn today and your tests are completely normal, you will receive your results only by: Marland Kitchen MyChart Message (if you have MyChart) OR . A paper copy in the mail If you have any lab test that is abnormal or we need to change your treatment, we will call you to review the results.   Testing/Procedures:   [facility] [address] [place], [place] [address] Phone:  [phone]    Please arrive 15 minutes prior to your appointment time for registration and insurance purposes.  The test will take approximately 3 to 4 hours to complete; you may bring reading material.  If someone comes with you to your appointment, they will need to remain in the main lobby due to limited space in the testing area. **If you are pregnant or breastfeeding, please notify the nuclear lab prior to your appointment**  How to prepare for your Myocardial Perfusion Test: . Do not eat or drink 3 hours prior to your test, except you may have water. . Do not consume products containing caffeine (regular or decaffeinated) 12 hours prior to your test. (ex: coffee, chocolate, sodas, tea). . Do bring a list of your current medications with you.  If not listed below, you may take your medications as normal. .  HOLD diabetic medication/insulin the morning of the test: metformin . Do wear comfortable clothes (no dresses or overalls) and walking shoes, tennis shoes preferred (No heels or open toe shoes are allowed). . Do NOT wear cologne, perfume, aftershave, or lotions (deodorant is allowed). . If these instructions are not followed, your test will have to be rescheduled.  Please report to [address] for your test.  If you have questions or concerns about your appointment, you can call the [facility] at [phone].  If you cannot keep your appointment, please provide 24 hours notification to the Nuclear Lab, to avoid a possible $50 charge to your account.    Follow-Up: At [facility], you and your health needs are our priority.  As part of our continuing mission to provide you with exceptional heart care, we have created designated Provider Care Teams.  These Care Teams include your primary Cardiologist (physician) and Advanced Practice Providers (APPs -  Physician Assistants and Nurse Practitioners) who all work together to provide you with the care you need, when you need it.  We recommend signing up for the patient portal called "MyChart".  Sign up information is provided on this After Visit Summary.  MyChart is used to connect with patients for Virtual Visits (Telemedicine).  Patients are able to view lab/test results, encounter notes, upcoming appointments, etc.  Non-urgent messages can be sent to your provider as well.   To learn more about what you can do with MyChart, go to [email or web address].    Your next appointment:   2 month(s)  The format for your next appointment:   In Person  Provider:   Jenne Campus, MD   Other Instructions  Ranolazine tablets, extended release What is this medicine? RANOLAZINE (ra NOE la zeen) is a heart medicine. It is used to treat chronic chest pain (angina). This medicine must be taken  regularly. It will not relieve an acute episode of chest pain. This medicine may be used for other purposes; ask your health care provider or pharmacist if you have questions. COMMON BRAND NAME(S): Ranexa What should I tell my health care provider before I take this medicine? They need to know if you have any of these conditions:  heart disease  irregular heartbeat  kidney disease  liver  disease  low levels of potassium or magnesium in the blood  an unusual or allergic reaction to ranolazine, other medicines, foods, dyes, or preservatives  pregnant or trying to get pregnant  breast-feeding How should I use this medicine? Take this medicine by mouth with a glass of water. Follow the directions on the prescription label. Do not cut, crush, or chew this medicine. Take with or without food. Do not take this medication with grapefruit juice. Take your doses at regular intervals. Do not take your medicine more often then directed. Talk to your pediatrician regarding the use of this medicine in children. Special care may be needed. Overdosage: If you think you have taken too much of this medicine contact a poison control center or emergency room at once. NOTE: This medicine is only for you. Do not share this medicine with others. What if I miss a dose? If you miss a dose, take it as soon as you can. If it is almost time for your next dose, take only that dose. Do not take double or extra doses. What may interact with this medicine? Do not take this medicine with any of the following medications:  antivirals for HIV or AIDS  cerivastatin  certain antibiotics like chloramphenicol, clarithromycin, dalfopristin; quinupristin, isoniazid, rifabutin, rifampin, rifapentine  certain medicines used for cancer like imatinib, nilotinib  certain medicines for fungal infections like fluconazole, itraconazole, ketoconazole, posaconazole, voriconazole  certain medicines for irregular heart beat like dronedarone  certain medicines for seizures like carbamazepine, fosphenytoin, oxcarbazepine, phenobarbital, phenytoin  cisapride  conivaptan  cyclosporine  grapefruit or grapefruit juice  lumacaftor; ivacaftor  nefazodone  pimozide  quinacrine  St John's wort  thioridazine This medicine may also interact with the following medications:  alfuzosin  certain medicines for  depression, anxiety, or psychotic disturbances like bupropion, citalopram, fluoxetine, fluphenazine, paroxetine, perphenazine, risperidone, sertraline, trifluoperazine  certain medicines for cholesterol like atorvastatin, lovastatin, simvastatin  certain medicines for stomach problems like octreotide, palonosetron, prochlorperazine  eplerenone  ergot alkaloids like dihydroergotamine, ergonovine, ergotamine, methylergonovine  metformin  nicardipine  other medicines that prolong the QT interval (cause an abnormal heart rhythm) like dofetilide, ziprasidone  sirolimus  tacrolimus This list may not describe all possible interactions. Give your health care provider a list of all the medicines, herbs, non-prescription drugs, or dietary supplements you use. Also tell them if you smoke, drink alcohol, or use illegal drugs. Some items may interact with your medicine. What should I watch for while using this medicine? Visit your doctor for regular check ups. Tell your doctor or healthcare professional if your symptoms do not start to get better or if they get worse. This medicine will not relieve an acute attack of angina or chest pain. This medicine can change your heart rhythm. Your health care provider may check your heart rhythm by ordering an electrocardiogram (ECG) while you are taking this medicine. You may get drowsy or dizzy. Do not drive, use machinery, or do anything that needs mental alertness until you know how this medicine affects you. Do not stand or sit up quickly, especially if you  are an older patient. This reduces the risk of dizzy or fainting spells. Alcohol may interfere with the effect of this medicine. Avoid alcoholic drinks. If you are scheduled for any medical or dental procedure, tell your healthcare provider that you are taking this medicine. This medicine can interact with other medicines used during surgery. What side effects may I notice from receiving this  medicine? Side effects that you should report to your doctor or health care professional as soon as possible:  allergic reactions like skin rash, itching or hives, swelling of the face, lips, or tongue  breathing problems  changes in vision  fast, irregular or pounding heartbeat  feeling faint or lightheaded, falls  low or high blood pressure  numbness or tingling feelings  ringing in the ears  tremor or shakiness  slow heartbeat (fewer than 50 beats per minute)  swelling of the legs or feet Side effects that usually do not require medical attention (report to your doctor or health care professional if they continue or are bothersome):  constipation  drowsy  dry mouth  headache  nausea or vomiting  stomach upset This list may not describe all possible side effects. Call your doctor for medical advice about side effects. You may report side effects to FDA at 1-800-FDA-1088. Where should I keep my medicine? Keep out of the reach of children. Store at room temperature between 15 and 30 degrees C (59 and 86 degrees F). Throw away any unused medicine after the expiration date. NOTE: This sheet is a summary. It may not cover all possible information. If you have questions about this medicine, talk to your doctor, pharmacist, or health care provider.  2020 Elsevier/Gold Standard (2018-08-01 09:18:49)   Cardiac Nuclear Scan A cardiac nuclear scan is a test that measures blood flow to the heart when a person is resting and when he or she is exercising. The test looks for problems such as:  Not enough blood reaching a portion of the heart.  The heart muscle not working normally. You may need this test if:  You have heart disease.  You have had abnormal lab results.  You have had heart surgery or a balloon procedure to open up blocked arteries (angioplasty).  You have chest pain.  You have shortness of breath. In this test, a radioactive dye (tracer) is injected  into your bloodstream. After the tracer has traveled to your heart, an imaging device is used to measure how much of the tracer is absorbed by or distributed to various areas of your heart. This procedure is usually done at a hospital and takes 2-4 hours. Tell a health care provider about:  Any allergies you have.  All medicines you are taking, including vitamins, herbs, eye drops, creams, and over-the-counter medicines.  Any problems you or family members have had with anesthetic medicines.  Any blood disorders you have.  Any surgeries you have had.  Any medical conditions you have.  Whether you are pregnant or may be pregnant. What are the risks? Generally, this is a safe procedure. However, problems may occur, including:  Serious chest pain and heart attack. This is only a risk if the stress portion of the test is done.  Rapid heartbeat.  Sensation of warmth in your chest. This usually passes quickly.  Allergic reaction to the tracer. What happens before the procedure?  Ask your health care provider about changing or stopping your regular medicines. This is especially important if you are taking diabetes medicines or blood thinners.  Follow instructions from your health care provider about eating or drinking restrictions.  Remove your jewelry on the day of the procedure. What happens during the procedure?  An IV will be inserted into one of your veins.  Your health care provider will inject a small amount of radioactive tracer through the IV.  You will wait for 20-40 minutes while the tracer travels through your bloodstream.  Your heart activity will be monitored with an electrocardiogram (ECG).  You will lie down on an exam table.  Images of your heart will be taken for about 15-20 minutes.  You may also have a stress test. For this test, one of the following may be done: ? You will exercise on a treadmill or stationary bike. While you exercise, your heart's  activity will be monitored with an ECG, and your blood pressure will be checked. ? You will be given medicines that will increase blood flow to parts of your heart. This is done if you are unable to exercise.  When blood flow to your heart has peaked, a tracer will again be injected through the IV.  After 20-40 minutes, you will get back on the exam table and have more images taken of your heart.  Depending on the type of tracer used, scans may need to be repeated 3-4 hours later.  Your IV line will be removed when the procedure is over. The procedure may vary among health care providers and hospitals. What happens after the procedure?  Unless your health care provider tells you otherwise, you may return to your normal schedule, including diet, activities, and medicines.  Unless your health care provider tells you otherwise, you may increase your fluid intake. This will help to flush the contrast dye from your body. Drink enough fluid to keep your urine pale yellow.  Ask your health care provider, or the department that is doing the test: ? When will my results be ready? ? How will I get my results? Summary  A cardiac nuclear scan measures the blood flow to the heart when a person is resting and when he or she is exercising.  Tell your health care provider if you are pregnant.  Before the procedure, ask your health care provider about changing or stopping your regular medicines. This is especially important if you are taking diabetes medicines or blood thinners.  After the procedure, unless your health care provider tells you otherwise, increase your fluid intake. This will help flush the contrast dye from your body.  After the procedure, unless your health care provider tells you otherwise, you may return to your normal schedule, including diet, activities, and medicines. This information is not intended to replace advice given to you by your health care provider. Make sure you  discuss any questions you have with your health care provider. Document Revised: 01/23/2018 Document Reviewed: 01/23/2018 Elsevier Patient Education  [place].

## 2020-07-28 NOTE — Addendum Note (Signed)
Addended by: Senaida Ores on: 07/28/2020 03:44 PM   Modules accepted: Orders

## 2020-07-31 ENCOUNTER — Telehealth: Payer: Self-pay | Admitting: Cardiology

## 2020-07-31 NOTE — Telephone Encounter (Signed)
Called patient.Informed patient that samples are ready for him in the [place] office. He verbally understood. No further questions.

## 2020-07-31 NOTE — Telephone Encounter (Signed)
Patient wants to know if there was anyway he could get Eliquis samples for rest of year due to cost of medication. Please call to discuss

## 2020-08-06 DIAGNOSIS — E1159 Type 2 diabetes mellitus with other circulatory complications: Secondary | ICD-10-CM | POA: Diagnosis not present

## 2020-08-06 DIAGNOSIS — Z9181 History of falling: Secondary | ICD-10-CM | POA: Diagnosis not present

## 2020-08-06 DIAGNOSIS — I2510 Atherosclerotic heart disease of native coronary artery without angina pectoris: Secondary | ICD-10-CM | POA: Diagnosis not present

## 2020-08-06 DIAGNOSIS — Z0000 Encounter for general adult medical examination without abnormal findings: Secondary | ICD-10-CM | POA: Diagnosis not present

## 2020-08-21 ENCOUNTER — Other Ambulatory Visit: Payer: Self-pay | Admitting: Cardiology

## 2020-08-21 NOTE — Telephone Encounter (Signed)
Prescription refill request for Eliquis received. Indication: atrial flutter Last office visit: 07/2020 krasowski Scr: 1.1 07/2019 Age: 86 Weight: 68 kg  Prescription refilled

## 2020-08-28 DIAGNOSIS — E1159 Type 2 diabetes mellitus with other circulatory complications: Secondary | ICD-10-CM | POA: Diagnosis not present

## 2020-08-28 DIAGNOSIS — K5900 Constipation, unspecified: Secondary | ICD-10-CM | POA: Diagnosis not present

## 2020-08-28 DIAGNOSIS — H6123 Impacted cerumen, bilateral: Secondary | ICD-10-CM | POA: Diagnosis not present

## 2020-08-28 DIAGNOSIS — W57XXXA Bitten or stung by nonvenomous insect and other nonvenomous arthropods, initial encounter: Secondary | ICD-10-CM | POA: Diagnosis not present

## 2020-08-28 DIAGNOSIS — E538 Deficiency of other specified B group vitamins: Secondary | ICD-10-CM | POA: Diagnosis not present

## 2020-08-28 DIAGNOSIS — Z6822 Body mass index (BMI) 22.0-22.9, adult: Secondary | ICD-10-CM | POA: Diagnosis not present

## 2020-09-12 DIAGNOSIS — K5900 Constipation, unspecified: Secondary | ICD-10-CM | POA: Diagnosis not present

## 2020-09-12 DIAGNOSIS — Z6821 Body mass index (BMI) 21.0-21.9, adult: Secondary | ICD-10-CM | POA: Diagnosis not present

## 2020-09-12 DIAGNOSIS — H6122 Impacted cerumen, left ear: Secondary | ICD-10-CM | POA: Diagnosis not present

## 2020-09-17 ENCOUNTER — Ambulatory Visit: Payer: Medicare Other | Admitting: Cardiology

## 2020-09-30 ENCOUNTER — Other Ambulatory Visit: Payer: Self-pay

## 2020-10-08 ENCOUNTER — Ambulatory Visit: Payer: Medicare Other | Admitting: Cardiology

## 2020-10-08 ENCOUNTER — Encounter: Payer: Self-pay | Admitting: Cardiology

## 2020-10-08 ENCOUNTER — Other Ambulatory Visit: Payer: Self-pay

## 2020-10-08 VITALS — BP 150/68 | HR 83 | Ht 69.0 in | Wt 146.0 lb

## 2020-10-08 DIAGNOSIS — I4892 Unspecified atrial flutter: Secondary | ICD-10-CM

## 2020-10-08 DIAGNOSIS — Z951 Presence of aortocoronary bypass graft: Secondary | ICD-10-CM

## 2020-10-08 DIAGNOSIS — I10 Essential (primary) hypertension: Secondary | ICD-10-CM

## 2020-10-08 NOTE — Progress Notes (Signed)
Cardiology Office Note:    Date:  10/08/2020   ID:  Joseph Mcmillan, DOB 17-Oct-1933, MRN [id number]  PCP:  Mateo Flow, MD  Cardiologist:  Jenne Campus, MD    Referring MD: Mateo Flow, MD   Chief Complaint  Patient presents with  . Follow-up    History of Present Illness:    Joseph Mcmillan is a 87 y.o. male with past medical history significant for coronary artery disease.  3 years ago he did have coronary artery bypass graft with 5 grafts.  Also carotic endarterectomy in 2018, essential hypertension, dyslipidemia.  Comes today to my office for follow-up.  Overall he seems to be doing well described to have some chest pain but very atypical only happening when he lays down at the beginning he said that he is more aware of his heart beating right and having chest pain.  He does have [place] acres [place] still does some work there but not as much as he used to.  Overall he is happy the way he is.  Past Medical History:  Diagnosis Date  . Aortic calcification (HCC)    Seen on CT scan  . Blepharitis of upper and lower eyelids of both eyes 11/29/2017  . BPH (benign prostatic hypertrophy)   . CAD (coronary artery disease)    Coronary artery calcification noted on CT scan  . Carotid artery stenosis, symptomatic, left - s/p CEA 07/26/2017  . Cataract, right eye 1994   Extracted with lens implant  . Deprivation amblyopia 08/24/2011  . Dermatochalasis 08/24/2011  . Essential hypertension 07/26/2017  . Fatigue due to excessive exertion 07/26/2017  . History of kidney stones   . History of left-sided carotid endarterectomy 07/26/2017  . History of TIAs 2011   Status post left carotid endarterectomy  . Hyperlipidemia   . Hyperlipidemia with target LDL less than 70 07/26/2017  . Hypertension   . Keratoconjunctivitis sicca of both eyes not specified as Sjogren's 11/29/2017  . Left-sided carotid artery disease ([place]) 02/2010   Left carotid endarterectomy  . Malnutrition of moderate degree 04/10/2018  .  Mature cataract 08/24/2011  . Occlusion and stenosis of carotid artery without mention of cerebral infarction 10/30/2012  . Paroxysmal atrial flutter ([place]) 05/19/2018  . Pernicious anemia   . Posterior capsular opacification of right eye, not obscuring vision 08/24/2011  . Posterior vitreous detachment 08/24/2011  . Presbyopia of both eyes 03/01/2017  . Pseudophakia 08/24/2011  . S/P CABG x 5 04/04/2018  . Weakness of both lower extremities 07/26/2017    Past Surgical History:  Procedure Laterality Date  . CARDIAC CATHETERIZATION  03/30/2018  . CAROTID ARTERY ANGIOPLASTY Left   . CAROTID ENDARTERECTOMY  03-06-2010   Left CEA  . CATARACT EXTRACTION W/ INTRAOCULAR LENS IMPLANT Right   . CHOLECYSTECTOMY    . COLONOSCOPY  04/17/2014   Moderate sigmoid diverticulosis. Otherwise, normal colonoscopy  . CORONARY ARTERY BYPASS GRAFT N/A 04/04/2018   Procedure: CORONARY ARTERY BYPASS GRAFTING (CABG)TIMES 5, USING LEFT  INTERNAL MAMMARY ARTERY AND ENDOSCOPICALLY HARVESTED RIGHT AND LEFT SAPHENOUS VEIN;  Surgeon: Gaye Pollack, MD;  Location: [facility];  Service: Open Heart Surgery;  Laterality: N/A;  . ENDOVEIN HARVEST OF GREATER SAPHENOUS VEIN Right 04/04/2018   Procedure: ENDOVEIN HARVEST OF GREATER SAPHENOUS VEIN;  Surgeon: Gaye Pollack, MD;  Location: [facility];  Service: Open Heart Surgery;  Laterality: Right;  . LEFT HEART CATH AND CORONARY ANGIOGRAPHY N/A 03/30/2018   Procedure: LEFT HEART CATH AND  CORONARY ANGIOGRAPHY;  Surgeon: Jettie Booze, MD;  Location: [facility];  Service: Cardiovascular;  Laterality: N/A;  . MELANOMA EXCISION    . TEE WITHOUT CARDIOVERSION N/A 04/04/2018   Procedure: TRANSESOPHAGEAL ECHOCARDIOGRAM (TEE);  Surgeon: Gaye Pollack, MD;  Location: [facility];  Service: Open Heart Surgery;  Laterality: N/A;    Current Medications: Current Meds  Medication Sig  . atorvastatin (LIPITOR) 10 MG tablet TAKE ONE (1) TABLET ONCE DAILY  . ELIQUIS 5 MG TABS tablet TAKE ONE TABLET  TWICE DAILY  . GOODSENSE CLEARLAX 17 GM/SCOOP powder Take 17 g by mouth 2 (two) times daily as needed. Regulate bowel movement  . metFORMIN (GLUCOPHAGE-XR) 750 MG 24 hr tablet Take 750 mg by mouth daily.  . metoprolol tartrate (LOPRESSOR) 25 MG tablet TAKE ONE TABLET TWICE DAILY  . nitroGLYCERIN (NITROSTAT) 0.4 MG SL tablet Place 1 tablet under the tongue as needed for chest pain.     Allergies:   Codeine   Social History   Socioeconomic History  . Marital status: Married    Spouse name: Not on file  . Number of children: Not on file  . Years of education: Not on file  . Highest education level: Not on file  Occupational History  . Occupation: Retired  Tobacco Use  . Smoking status: Never Smoker  . Smokeless tobacco: Never Used  Vaping Use  . Vaping Use: Never used  Substance and Sexual Activity  . Alcohol use: No  . Drug use: No  . Sexual activity: Not on file  Other Topics Concern  . Not on file  Social History Narrative  . Not on file   Social Determinants of Health   Financial Resource Strain: Not on file  Food Insecurity: Not on file  Transportation Needs: Not on file  Physical Activity: Not on file  Stress: Not on file  Social Connections: Not on file     Family History: The patient's family history includes Cancer in his mother; Stroke in his father. ROS:   Please see the history of present illness.    All 14 point review of systems negative except as described per history of present illness  EKGs/Labs/Other Studies Reviewed:      Recent Labs: No results found for requested labs within last 8760 hours.  Recent Lipid Panel    Component Value Date/Time   CHOL 139 02/05/2020 1549   TRIG 104 02/05/2020 1549   HDL 59 02/05/2020 1549   CHOLHDL 2.4 02/05/2020 1549   CHOLHDL 2.6 03/31/2018 0846   VLDL 19 03/31/2018 0846   LDLCALC 61 02/05/2020 1549    Physical Exam:    VS:  BP (!) 150/68 (BP Location: Left Arm, Patient Position: Sitting)   Pulse 83    Ht 5\' 9"  (1.753 m)   Wt 146 lb (66.2 kg)   SpO2 97%   BMI 21.56 kg/m     Wt Readings from Last 3 Encounters:  10/08/20 146 lb (66.2 kg)  07/28/20 150 lb (68 kg)  02/05/20 152 lb 6.4 oz (69.1 kg)     GEN:  Well nourished, well developed in no acute distress HEENT: Normal NECK: No JVD; No carotid bruits LYMPHATICS: No lymphadenopathy CARDIAC: RRR, no murmurs, no rubs, no gallops RESPIRATORY:  Clear to auscultation without rales, wheezing or rhonchi  ABDOMEN: Soft, non-tender, non-distended MUSCULOSKELETAL:  No edema; No deformity  SKIN: Warm and dry LOWER EXTREMITIES: no swelling NEUROLOGIC:  Alert and oriented x 3 PSYCHIATRIC:  Normal affect  ASSESSMENT:    1. S/P CABG x 5   2. Paroxysmal atrial flutter (HCC)   3. Essential hypertension    PLAN:    In order of problems listed above:  1. Status post coronary bypass graft stable from that point review.  The symptoms that he described to me are very atypical.  I encouraged him to be more active. 2. Paroxysmal atrial flutter denies have any palpitations, he is anticoagulated Eliquis which I will continue. 3. Dyslipidemia he is on Lipitor 10 mg daily which I will continue I did review his K PN which show me data from 02/05/2020 with LDL of 61 HDL 59.  We will continue present management we will recheck his fasting lipid profile. 4. History of carotic arterial disease. 5. When I see him we will schedule him to have carotic ultrasound. 6. Diabetes.  His hemoglobin A1c from August 28, 2020 is a 7.1 I told him need to be slightly better control and encouraged him to be a little more active.  He is looking forward to better whether he will be able to do more.   Medication Adjustments/Labs and Tests Ordered: Current medicines are reviewed at length with the patient today.  Concerns regarding medicines are outlined above.  No orders of the defined types were placed in this encounter.  Medication changes: No orders of the defined  types were placed in this encounter.   Signed, Park Liter, MD, [facility] 10/08/2020 4:30 PM    [place]

## 2020-10-08 NOTE — Patient Instructions (Signed)

## 2020-11-03 DIAGNOSIS — K862 Cyst of pancreas: Secondary | ICD-10-CM | POA: Diagnosis not present

## 2020-11-03 DIAGNOSIS — Z9049 Acquired absence of other specified parts of digestive tract: Secondary | ICD-10-CM | POA: Diagnosis not present

## 2020-11-03 DIAGNOSIS — I700 Atherosclerosis of aorta: Secondary | ICD-10-CM | POA: Diagnosis not present

## 2020-11-03 DIAGNOSIS — N281 Cyst of kidney, acquired: Secondary | ICD-10-CM | POA: Diagnosis not present

## 2020-11-11 DIAGNOSIS — H9392 Unspecified disorder of left ear: Secondary | ICD-10-CM | POA: Diagnosis not present

## 2020-11-11 DIAGNOSIS — Z6822 Body mass index (BMI) 22.0-22.9, adult: Secondary | ICD-10-CM | POA: Diagnosis not present

## 2020-11-11 DIAGNOSIS — K862 Cyst of pancreas: Secondary | ICD-10-CM | POA: Diagnosis not present

## 2020-11-19 DIAGNOSIS — Z6821 Body mass index (BMI) 21.0-21.9, adult: Secondary | ICD-10-CM | POA: Diagnosis not present

## 2020-11-19 DIAGNOSIS — W57XXXA Bitten or stung by nonvenomous insect and other nonvenomous arthropods, initial encounter: Secondary | ICD-10-CM | POA: Diagnosis not present

## 2020-11-19 DIAGNOSIS — K529 Noninfective gastroenteritis and colitis, unspecified: Secondary | ICD-10-CM | POA: Diagnosis not present

## 2020-12-06 ENCOUNTER — Other Ambulatory Visit: Payer: Self-pay | Admitting: Cardiology

## 2020-12-08 NOTE — Telephone Encounter (Signed)
Lipitor 10 mg # 90 x 3 additional refills to pharmacy

## 2020-12-18 DIAGNOSIS — L728 Other follicular cysts of the skin and subcutaneous tissue: Secondary | ICD-10-CM | POA: Diagnosis not present

## 2020-12-18 DIAGNOSIS — L578 Other skin changes due to chronic exposure to nonionizing radiation: Secondary | ICD-10-CM | POA: Diagnosis not present

## 2020-12-18 DIAGNOSIS — L570 Actinic keratosis: Secondary | ICD-10-CM | POA: Diagnosis not present

## 2020-12-18 DIAGNOSIS — L821 Other seborrheic keratosis: Secondary | ICD-10-CM | POA: Diagnosis not present

## 2021-02-05 DIAGNOSIS — L821 Other seborrheic keratosis: Secondary | ICD-10-CM | POA: Diagnosis not present

## 2021-02-05 DIAGNOSIS — L578 Other skin changes due to chronic exposure to nonionizing radiation: Secondary | ICD-10-CM | POA: Diagnosis not present

## 2021-02-05 DIAGNOSIS — L570 Actinic keratosis: Secondary | ICD-10-CM | POA: Diagnosis not present

## 2021-02-05 DIAGNOSIS — B079 Viral wart, unspecified: Secondary | ICD-10-CM | POA: Diagnosis not present

## 2021-02-09 DIAGNOSIS — Z23 Encounter for immunization: Secondary | ICD-10-CM | POA: Diagnosis not present

## 2021-02-24 ENCOUNTER — Other Ambulatory Visit: Payer: Self-pay | Admitting: Cardiology

## 2021-02-24 NOTE — Telephone Encounter (Signed)
Prescription refill request for Eliquis received. Indication:atrial flutter Last office visit:2/22 Scr:1.0 Age: 87 Weight:66.2  kg  Prescription refilled

## 2021-04-07 ENCOUNTER — Telehealth: Payer: Self-pay | Admitting: Cardiology

## 2021-04-07 ENCOUNTER — Encounter: Payer: Self-pay | Admitting: Cardiology

## 2021-04-07 ENCOUNTER — Other Ambulatory Visit: Payer: Self-pay

## 2021-04-07 ENCOUNTER — Ambulatory Visit: Payer: Medicare Other | Admitting: Cardiology

## 2021-04-07 VITALS — BP 146/88 | HR 58 | Ht 69.0 in | Wt 148.8 lb

## 2021-04-07 DIAGNOSIS — I6522 Occlusion and stenosis of left carotid artery: Secondary | ICD-10-CM

## 2021-04-07 DIAGNOSIS — Z951 Presence of aortocoronary bypass graft: Secondary | ICD-10-CM

## 2021-04-07 DIAGNOSIS — E782 Mixed hyperlipidemia: Secondary | ICD-10-CM | POA: Diagnosis not present

## 2021-04-07 DIAGNOSIS — Z8679 Personal history of other diseases of the circulatory system: Secondary | ICD-10-CM

## 2021-04-07 DIAGNOSIS — I4892 Unspecified atrial flutter: Secondary | ICD-10-CM | POA: Diagnosis not present

## 2021-04-07 DIAGNOSIS — E785 Hyperlipidemia, unspecified: Secondary | ICD-10-CM

## 2021-04-07 MED ORDER — AMLODIPINE BESYLATE 5 MG PO TABS: 5.0000 mg | ORAL_TABLET | Freq: Every day | ORAL | 3 refills | Status: DC

## 2021-04-07 NOTE — Telephone Encounter (Signed)
Pt c/o medication issue:  1. Name of Medication:  amLODipine (NORVASC) 5 MG tablet  2. How are you currently taking this medication (dosage and times per day)? N/A  3. Are you having a reaction (difficulty breathing--STAT)? N/A  4. What is your medication issue? Pharmacy is calling wanting to confirm prescription was supposed to be sent for 180 tablets.

## 2021-04-07 NOTE — Progress Notes (Signed)
Kg   

## 2021-04-07 NOTE — Patient Instructions (Addendum)
Medication Instructions:  Your physician has recommended you make the following change in your medication:  START: Norvasc 5 mg take one tablet by mouth daily.   *If you need a refill on your cardiac medications before your next appointment, please call your pharmacy*   Lab Work: None If you have labs (blood work) drawn today and your tests are completely normal, you will receive your results only by: [place] (if you have MyChart) OR A paper copy in the mail If you have any lab test that is abnormal or we need to change your treatment, we will call you to review the results.   Testing/Procedures: Your physician has requested that you have an echocardiogram. Echocardiography is a painless test that uses sound waves to create images of your heart. It provides your doctor with information about the size and shape of your heart and how well your heart's chambers and valves are working. This procedure takes approximately one hour. There are no restrictions for this procedure.  Your physician has requested that you have a carotid duplex. This test is an ultrasound of the carotid arteries in your neck. It looks at blood flow through these arteries that supply the brain with blood. Allow one hour for this exam. There are no restrictions or special instructions.    Follow-Up: At [facility], you and your health needs are our priority.  As part of our continuing mission to provide you with exceptional heart care, we have created designated Provider Care Teams.  These Care Teams include your primary Cardiologist (physician) and Advanced Practice Providers (APPs -  Physician Assistants and Nurse Practitioners) who all work together to provide you with the care you need, when you need it.  We recommend signing up for the patient portal called "MyChart".  Sign up information is provided on this After Visit Summary.  MyChart is used to connect with patients for Virtual Visits (Telemedicine).   Patients are able to view lab/test results, encounter notes, upcoming appointments, etc.  Non-urgent messages can be sent to your provider as well.   To learn more about what you can do with MyChart, go to [email or web address].    Your next appointment:   6 month(s)  The format for your next appointment:   In Person  Provider:   Dr. Agustin Cree    Other Instructions

## 2021-04-07 NOTE — Telephone Encounter (Signed)
START: Norvasc 5 mg take one tablet by mouth daily.   Called and spoke with pharmacist at [facility].  Advised MD did start pt on Amlodipine 5 mg po daily and rx should be for #90 with refills for 1 yr.  Not #180 with end date of 07/06/2021 as charted.  Pharmacist had no further questions at the time of the call.

## 2021-04-07 NOTE — Progress Notes (Signed)
Cardiology Office Note:    Date:  04/07/2021   ID:  Joseph Mcmillan, DOB [birth year], MRN [id number]  PCP:  Mateo Flow, MD  Cardiologist:  Jenne Campus, MD    Referring MD: Mateo Flow, MD   No chief complaint on file. I am doing well  History of Present Illness:    Joseph Mcmillan is a 87 y.o. male with past medical history significant for coronary artery disease.  3 years ago he did have coronary bypass graft with 5 grafts.  Also carotic endarterectomy done in 2018, essential hypertension, dyslipidemia.  Is coming today to my office for follow-up.  Overall cardiac wise doing very well.  He is upset about the fact that Eliquis caused him more and more every single time.  Denies have any cardiac complaints.  There is no chest pain tightness squeezing pressure burning chest.  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/24/2011   Dermatochalasis 08/24/2011   Essential hypertension 07/26/2017   Fatigue due to excessive exertion 07/26/2017   History of kidney stones    History of left-sided carotid endarterectomy 07/26/2017   History of TIAs 2011   Status post left carotid endarterectomy   Hyperlipidemia    Hyperlipidemia with target LDL less than 70 07/26/2017   Hypertension    Keratoconjunctivitis sicca of both eyes not specified as Sjogren's 11/29/2017   Left-sided carotid artery disease ([place]) 02/2010   Left carotid endarterectomy   Malnutrition of moderate degree 04/10/2018   Mature cataract 08/24/2011   Occlusion and stenosis of carotid artery without mention of cerebral infarction 10/30/2012   Paroxysmal atrial  flutter ([place]) 05/19/2018   Pernicious anemia    Posterior capsular opacification of right eye, not obscuring vision 08/24/2011   Posterior vitreous detachment 08/24/2011   Presbyopia of both eyes 03/01/2017   Pseudophakia 08/24/2011   S/P CABG x 5 04/04/2018   Weakness of both lower extremities 07/26/2017    Past Surgical History:  Procedure Laterality Date   CARDIAC CATHETERIZATION  03/30/2018   CAROTID ARTERY ANGIOPLASTY Left    CAROTID ENDARTERECTOMY  03-06-2010   Left CEA   CATARACT EXTRACTION W/ INTRAOCULAR LENS IMPLANT Right    CHOLECYSTECTOMY     COLONOSCOPY  04/17/2014   Moderate sigmoid diverticulosis. Otherwise, normal colonoscopy   CORONARY ARTERY BYPASS GRAFT N/A 04/04/2018   Procedure: CORONARY ARTERY BYPASS GRAFTING (CABG)TIMES 5, USING LEFT  INTERNAL MAMMARY ARTERY AND ENDOSCOPICALLY HARVESTED RIGHT AND LEFT SAPHENOUS VEIN;  Surgeon: Gaye Pollack, MD;  Location: [facility];  Service: Open Heart Surgery;  Laterality: N/A;   ENDOVEIN HARVEST OF GREATER SAPHENOUS VEIN Right 04/04/2018   Procedure: ENDOVEIN HARVEST OF GREATER SAPHENOUS VEIN;  Surgeon: Gaye Pollack, MD;  Location: [place];  Service: Open Heart Surgery;  Laterality: Right;   LEFT HEART CATH AND CORONARY ANGIOGRAPHY N/A 03/30/2018   Procedure: LEFT HEART CATH AND CORONARY ANGIOGRAPHY;  Surgeon: Jettie Booze, MD;  Location: [place] [facility];  Service: Cardiovascular;  Laterality: N/A;   MELANOMA EXCISION     TEE WITHOUT CARDIOVERSION N/A  04/04/2018   Procedure: TRANSESOPHAGEAL ECHOCARDIOGRAM (TEE);  Surgeon: Gaye Pollack, MD;  Location: [place];  Service: Open Heart Surgery;  Laterality: N/A;    Current Medications: No outpatient medications have been marked as taking for the 04/07/21 encounter (Appointment) with Park Liter, MD.     Allergies:   Codeine   Social History   Socioeconomic History   Marital status: Married    Spouse name: Not on file   Number of children: Not on file   Years of  education: Not on file   Highest education level: Not on file  Occupational History   Occupation: Retired  Tobacco Use   Smoking status: Never   Smokeless tobacco: Never  Vaping Use   Vaping Use: Never used  Substance and Sexual Activity   Alcohol use: No   Drug use: No   Sexual activity: Not on file  Other Topics Concern   Not on file  Social History Narrative   Not on file   Social Determinants of Health   Financial Resource Strain: Not on file  Food Insecurity: Not on file  Transportation Needs: Not on file  Physical Activity: Not on file  Stress: Not on file  Social Connections: Not on file     Family History: The patient's family history includes Cancer in his mother; Stroke in his father. ROS:   Please see the history of present illness.    All 14 point review of systems negative except as described per history of present illness  EKGs/Labs/Other Studies Reviewed:      Recent Labs: No results found for requested labs within last 8760 hours.  Recent Lipid Panel    Component Value Date/Time   CHOL 139 02/05/2020 1549   TRIG 104 02/05/2020 1549   HDL 59 02/05/2020 1549   CHOLHDL 2.4 02/05/2020 1549   CHOLHDL 2.6 03/31/2018 0846   VLDL 19 03/31/2018 0846   LDLCALC 61 02/05/2020 1549    Physical Exam:    VS:  There were no vitals taken for this visit.    Wt Readings from Last 3 Encounters:  10/08/20 146 lb (66.2 kg)  07/28/20 150 lb (68 kg)  02/05/20 152 lb 6.4 oz (69.1 kg)     GEN:  Well nourished, well developed in no acute distress HEENT: Normal NECK: No JVD; No carotid bruits LYMPHATICS: No lymphadenopathy CARDIAC: RRR, no murmurs, no rubs, no gallops RESPIRATORY:  Clear to auscultation without rales, wheezing or rhonchi  ABDOMEN: Soft, non-tender, non-distended MUSCULOSKELETAL:  No edema; No deformity  SKIN: Warm and dry LOWER EXTREMITIES: no swelling NEUROLOGIC:  Alert and oriented x 3 PSYCHIATRIC:  Normal affect   ASSESSMENT:    1.  S/P CABG x 5   2. Hyperlipidemia with target LDL less than 70   3. Paroxysmal atrial flutter (HCC)   4. Carotid artery stenosis, symptomatic, left - s/p CEA   5. Mixed hyperlipidemia    PLAN:    In order of problems listed above:  Coronary disease stable from that point review we will continue present management Dyslipidemia.  He is taking Lipitor 10 which I will continue.  I did review his K PN which show me his LDL of 61 and HDL of 59 this is from 02/05/2020 we will continue present management, arrangements will be made to check his fasting lipid profile. Paroxysmal atrial flutter maintaining sinus rhythm we will continue with Eliquis. History of carotic arterial disease.  He will be scheduled have carotic ultrasound. History of  coronary artery disease with Q waves anteriorly.  We will check his echocardiogram to check left ventricular ejection fraction. I am concerned about his blood pressure which is not well controlled today but he is upset about Eliquis prices.  We will recheck his blood pressure before he leaves if it still elevated I will give him Norvasc 5 mg daily.   Medication Adjustments/Labs and Tests Ordered: Current medicines are reviewed at length with the patient today.  Concerns regarding medicines are outlined above.  No orders of the defined types were placed in this encounter.  Medication changes: No orders of the defined types were placed in this encounter.   Signed, Park Liter, MD, [facility] 04/07/2021 1:29 PM    [place]

## 2021-04-08 DIAGNOSIS — K862 Cyst of pancreas: Secondary | ICD-10-CM | POA: Diagnosis not present

## 2021-04-08 DIAGNOSIS — Z6822 Body mass index (BMI) 22.0-22.9, adult: Secondary | ICD-10-CM | POA: Diagnosis not present

## 2021-04-22 ENCOUNTER — Other Ambulatory Visit: Payer: Medicare Other

## 2021-05-04 ENCOUNTER — Ambulatory Visit (INDEPENDENT_AMBULATORY_CARE_PROVIDER_SITE_OTHER): Payer: Medicare Other

## 2021-05-04 ENCOUNTER — Other Ambulatory Visit: Payer: Self-pay

## 2021-05-04 DIAGNOSIS — I6522 Occlusion and stenosis of left carotid artery: Secondary | ICD-10-CM

## 2021-05-04 DIAGNOSIS — Z8679 Personal history of other diseases of the circulatory system: Secondary | ICD-10-CM | POA: Diagnosis not present

## 2021-05-04 DIAGNOSIS — I2510 Atherosclerotic heart disease of native coronary artery without angina pectoris: Secondary | ICD-10-CM

## 2021-05-04 LAB — ECHOCARDIOGRAM COMPLETE
Area-P 1/2: 4.74 cm2
Calc EF: 52.1 %
S' Lateral: 3.4 cm
Single Plane A2C EF: 54.3 %
Single Plane A4C EF: 50.3 %

## 2021-05-11 ENCOUNTER — Telehealth: Payer: Self-pay

## 2021-05-11 NOTE — Telephone Encounter (Signed)
-----   Message from Park Liter, MD sent at 05/11/2021 12:04 PM EDT ----- Up to 39% stenosis in b right carotic artery, medical therapy

## 2021-05-11 NOTE — Telephone Encounter (Signed)
Spoke with Melissa, informed if results

## 2021-05-14 DIAGNOSIS — Z85820 Personal history of malignant melanoma of skin: Secondary | ICD-10-CM | POA: Diagnosis not present

## 2021-05-14 DIAGNOSIS — L821 Other seborrheic keratosis: Secondary | ICD-10-CM | POA: Diagnosis not present

## 2021-05-14 DIAGNOSIS — L570 Actinic keratosis: Secondary | ICD-10-CM | POA: Diagnosis not present

## 2021-05-14 DIAGNOSIS — L578 Other skin changes due to chronic exposure to nonionizing radiation: Secondary | ICD-10-CM | POA: Diagnosis not present

## 2021-06-05 ENCOUNTER — Telehealth: Payer: Self-pay | Admitting: Cardiology

## 2021-06-05 NOTE — Telephone Encounter (Signed)
Pt c/o of Chest Pain: STAT if CP now or developed within 24 hours  1. Are you having CP right now? no  2. Are you experiencing any other symptoms (ex. SOB, nausea, vomiting, sweating)? Feels something in his chest mostly at night, it's like a discomfort he is feeling on his chest.   3. How long have you been experiencing CP? Doesn't know, but it's been going on for a while  4. Is your CP continuous or coming and going? Comes and goes   5. Have you taken Nitroglycerin? no ?

## 2021-06-05 NOTE — Telephone Encounter (Signed)
Spoke with pt who states that this has been ongoing for a good while. Pt has not used his nitroglycerin as he states it is not severe but a dull pain. Pt states he can feel his heart beating and that if he takes several deep breaths it relieves it. I have made a future appointment with Dr. Agustin Cree 07/08/21. How do you advise?

## 2021-06-19 DIAGNOSIS — Z6822 Body mass index (BMI) 22.0-22.9, adult: Secondary | ICD-10-CM | POA: Diagnosis not present

## 2021-06-19 DIAGNOSIS — Z23 Encounter for immunization: Secondary | ICD-10-CM | POA: Diagnosis not present

## 2021-06-19 DIAGNOSIS — I10 Essential (primary) hypertension: Secondary | ICD-10-CM | POA: Diagnosis not present

## 2021-06-29 DIAGNOSIS — Z23 Encounter for immunization: Secondary | ICD-10-CM | POA: Diagnosis not present

## 2021-07-08 ENCOUNTER — Other Ambulatory Visit: Payer: Self-pay

## 2021-07-08 ENCOUNTER — Encounter: Payer: Self-pay | Admitting: Cardiology

## 2021-07-08 ENCOUNTER — Ambulatory Visit: Payer: Medicare Other | Admitting: Cardiology

## 2021-07-08 VITALS — BP 138/70 | HR 67 | Ht 70.0 in | Wt 149.0 lb

## 2021-07-08 DIAGNOSIS — E785 Hyperlipidemia, unspecified: Secondary | ICD-10-CM | POA: Diagnosis not present

## 2021-07-08 DIAGNOSIS — Z951 Presence of aortocoronary bypass graft: Secondary | ICD-10-CM | POA: Diagnosis not present

## 2021-07-08 DIAGNOSIS — I10 Essential (primary) hypertension: Secondary | ICD-10-CM | POA: Diagnosis not present

## 2021-07-08 DIAGNOSIS — Z9889 Other specified postprocedural states: Secondary | ICD-10-CM | POA: Diagnosis not present

## 2021-07-08 DIAGNOSIS — E782 Mixed hyperlipidemia: Secondary | ICD-10-CM | POA: Diagnosis not present

## 2021-07-08 NOTE — Progress Notes (Signed)
Cardiology Office Note:    Date:  07/08/2021   ID:  Joseph Mcmillan, DOB 09/20/1933, MRN [id number]  PCP:  Joseph Flow, MD  Cardiologist:  Joseph Campus, MD    Referring MD: Joseph Flow, MD   Chief Complaint  Patient presents with   Follow-up  Doing well  History of Present Illness:    Joseph Mcmillan is a 87 y.o. male  with past medical history significant for coronary artery disease.  3 years ago he did have coronary bypass graft with 5 grafts.  Also carotic endarterectomy done in 2018, essential hypertension, dyslipidemia. Last cardiac ultrasound done in September 2022 showing up to 39% stenosis on the right side, last echocardiogram done in September 2022 showing ejection fraction 5055% without segmental wall motion abnormalities. Today he comes to the office for follow-up.  Overall he is doing very well.  He denies have any chest pain tightness squeezing pressure burning chest.  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/24/2011   Dermatochalasis 08/24/2011   Essential hypertension 07/26/2017   Fatigue due to excessive exertion 07/26/2017   History of kidney stones    History of left-sided carotid endarterectomy 07/26/2017   History of TIAs 2011   Status post left carotid endarterectomy   Hyperlipidemia    Hyperlipidemia with target LDL less than 70 07/26/2017   Hypertension    Keratoconjunctivitis sicca of both eyes not specified as Sjogren's 11/29/2017   Left-sided carotid artery disease ([place]) 02/2010   Left carotid endarterectomy   Malnutrition of  moderate degree 04/10/2018   Mature cataract 08/24/2011   Occlusion and stenosis of carotid artery without mention of cerebral infarction 10/30/2012   Paroxysmal atrial flutter ([place]) 05/19/2018   Pernicious anemia    Posterior capsular opacification of right eye, not obscuring vision 08/24/2011   Posterior vitreous detachment 08/24/2011   Presbyopia of both eyes 03/01/2017   Pseudophakia 08/24/2011   S/P CABG x 5 04/04/2018   Weakness of both lower extremities 07/26/2017    Past Surgical History:  Procedure Laterality Date   CARDIAC CATHETERIZATION  03/30/2018   CAROTID ARTERY ANGIOPLASTY Left    CAROTID ENDARTERECTOMY  03-06-2010   Left CEA   CATARACT EXTRACTION W/ INTRAOCULAR LENS IMPLANT Right    CHOLECYSTECTOMY     COLONOSCOPY  04/17/2014   Moderate sigmoid diverticulosis. Otherwise, normal colonoscopy   CORONARY ARTERY BYPASS GRAFT N/A 04/04/2018   Procedure: CORONARY ARTERY BYPASS GRAFTING (CABG)TIMES 5, USING LEFT  INTERNAL MAMMARY ARTERY AND ENDOSCOPICALLY HARVESTED RIGHT AND LEFT SAPHENOUS VEIN;  Surgeon: Joseph Pollack, MD;  Location: [place];  Service: Open Heart Surgery;  Laterality: N/A;   ENDOVEIN HARVEST OF GREATER SAPHENOUS VEIN Right 04/04/2018   Procedure: ENDOVEIN HARVEST OF GREATER SAPHENOUS VEIN;  Surgeon: Joseph Pollack, MD;  Location: [place];  Service: Open Heart Surgery;  Laterality: Right;   LEFT HEART CATH AND CORONARY ANGIOGRAPHY N/A 03/30/2018   Procedure: LEFT HEART CATH AND CORONARY ANGIOGRAPHY;  Surgeon: Joseph Booze, MD;  Location: [place] [facility];  Service: Cardiovascular;  Laterality: N/A;   MELANOMA EXCISION     TEE WITHOUT CARDIOVERSION N/A 04/04/2018   Procedure: TRANSESOPHAGEAL ECHOCARDIOGRAM (TEE);  Surgeon: Joseph Pollack, MD;  Location: [place];  Service: Open Heart Surgery;  Laterality: N/A;    Current Medications: Current Meds  Medication Sig   amLODipine (NORVASC) 5 MG tablet Take 1 tablet (5 mg total) by mouth daily.   atorvastatin (LIPITOR) 10  MG tablet TAKE ONE (1) TABLET ONCE DAILY (Patient taking differently: Take 10 mg by mouth daily.)   ELIQUIS 5 MG TABS tablet TAKE ONE TABLET TWICE DAILY (Patient taking differently: Take 5 mg by mouth 2 (two) times daily.)   metFORMIN (GLUCOPHAGE-XR) 750 MG 24 hr tablet Take 750 mg by mouth daily.   nitroGLYCERIN (NITROSTAT) 0.4 MG SL tablet Place 1 tablet under the tongue as needed for chest pain.     Allergies:   Codeine   Social History   Socioeconomic History   Marital status: Married    Spouse name: Not on file   Number of children: Not on file   Years of education: Not on file   Highest education level: Not on file  Occupational History   Occupation: Retired  Tobacco Use   Smoking status: Never   Smokeless tobacco: Never  Vaping Use   Vaping Use: Never used  Substance and Sexual Activity   Alcohol use: No   Drug use: No   Sexual activity: Not on file  Other Topics Concern   Not on file  Social History Narrative   Not on file   Social Determinants of Health   Financial Resource Strain: Not on file  Food Insecurity: Not on file  Transportation Needs: Not on file  Physical Activity: Not on file  Stress: Not on file  Social Connections: Not on file     Family History: The patient's family history includes Cancer in his mother; Stroke in his father. ROS:   Please see the history of present illness.    All 14 point review of systems negative except as described per history of present illness  EKGs/Labs/Other Studies Reviewed:      Recent Labs: No results found for requested labs within last 8760 hours.  Recent Lipid Panel    Component Value Date/Time   CHOL 139 02/05/2020 1549   TRIG 104 02/05/2020 1549   HDL 59 02/05/2020 1549   CHOLHDL 2.4 02/05/2020 1549   CHOLHDL 2.6 03/31/2018 0846   VLDL 19 03/31/2018 0846   LDLCALC 61 02/05/2020 1549    Physical Exam:    VS:  There were no vitals taken for this visit.    Wt Readings from Last 3 Encounters:   04/07/21 148 lb 12.8 oz (67.5 kg)  10/08/20 146 lb (66.2 kg)  07/28/20 150 lb (68 kg)     GEN:  Well nourished, well developed in no acute distress HEENT: Normal NECK: No JVD; No carotid bruits LYMPHATICS: No lymphadenopathy CARDIAC: RRR, no murmurs, no rubs, no gallops RESPIRATORY:  Clear to auscultation without rales, wheezing or rhonchi  ABDOMEN: Soft, non-tender, non-distended MUSCULOSKELETAL:  No edema; No deformity  SKIN: Warm and dry LOWER EXTREMITIES: no swelling NEUROLOGIC:  Alert and oriented x 3 PSYCHIATRIC:  Normal affect   ASSESSMENT:    1. S/P CABG x 5   2. Hyperlipidemia with target LDL less than 70   3. History of left-sided carotid endarterectomy   4. Essential hypertension   5. Mixed  hyperlipidemia    PLAN:    In order of problems listed above:  Coronary disease status post coronary artery bypass graft doing well asymptomatic, denies have any chest pain tightness squeezing pressure burning chest. Dyslipidemia I did review his K PN which show me his LDL of a 61 and HDL of 59.  We will continue present management. Paroxysmal atrial flutter, denies having any palpitations.  He is on Eliquis which I will continue. Essential hypertension blood pressure seems to be decently controlled today we will continue present management. He is absolutely unsure about medication he takes I did review medication with him he cannot recall what he is taking the only thing he is sure about is the fact that he takes Eliquis twice daily.  I asked him to call us later today and tell us exactly what medication he takes okay to make right assessment.   Medication Adjustments/Labs and Tests Ordered: Current medicines are reviewed at length with the patient today.  Concerns regarding medicines are outlined above.  No orders of the defined types were placed in this encounter.  Medication changes: No orders of the defined types were placed in this encounter.   Signed, Park Liter, MD, [facility] 07/08/2021 9:16 AM    [place]

## 2021-07-08 NOTE — Patient Instructions (Signed)

## 2021-07-15 DIAGNOSIS — Z630 Problems in relationship with spouse or partner: Secondary | ICD-10-CM | POA: Diagnosis not present

## 2021-07-15 DIAGNOSIS — Z6822 Body mass index (BMI) 22.0-22.9, adult: Secondary | ICD-10-CM | POA: Diagnosis not present

## 2021-07-15 DIAGNOSIS — R413 Other amnesia: Secondary | ICD-10-CM | POA: Diagnosis not present

## 2021-07-28 NOTE — Addendum Note (Signed)
Addended by: Senaida Ores on: 07/28/2021 08:15 AM   Modules accepted: Orders

## 2021-10-12 ENCOUNTER — Ambulatory Visit: Payer: Medicare Other | Admitting: Cardiology

## 2021-10-22 ENCOUNTER — Other Ambulatory Visit: Payer: Self-pay | Admitting: Cardiology

## 2021-10-22 ENCOUNTER — Telehealth: Payer: Self-pay

## 2021-10-22 DIAGNOSIS — I4892 Unspecified atrial flutter: Secondary | ICD-10-CM

## 2021-10-22 DIAGNOSIS — I10 Essential (primary) hypertension: Secondary | ICD-10-CM

## 2021-10-22 NOTE — Telephone Encounter (Signed)
Patient needs BMET/CBC drawn for Eliquis refill.  Thank you ?

## 2021-10-22 NOTE — Telephone Encounter (Signed)
Prescription refill request for Eliquis received. ?Indication:Aflutter ?Last office visit:11/22 ?[id number] ?Age: 88 ?Weight:67.6 kg ? ?Prescription refilled ? ?

## 2021-10-26 DIAGNOSIS — R0789 Other chest pain: Secondary | ICD-10-CM | POA: Diagnosis not present

## 2021-10-26 DIAGNOSIS — J329 Chronic sinusitis, unspecified: Secondary | ICD-10-CM | POA: Diagnosis not present

## 2021-10-26 DIAGNOSIS — Z6822 Body mass index (BMI) 22.0-22.9, adult: Secondary | ICD-10-CM | POA: Diagnosis not present

## 2021-10-26 DIAGNOSIS — S62609A Fracture of unspecified phalanx of unspecified finger, initial encounter for closed fracture: Secondary | ICD-10-CM | POA: Diagnosis not present

## 2021-10-26 DIAGNOSIS — M79641 Pain in right hand: Secondary | ICD-10-CM | POA: Diagnosis not present

## 2021-10-26 DIAGNOSIS — M25531 Pain in right wrist: Secondary | ICD-10-CM | POA: Diagnosis not present

## 2021-10-28 DIAGNOSIS — S62636A Displaced fracture of distal phalanx of right little finger, initial encounter for closed fracture: Secondary | ICD-10-CM | POA: Diagnosis not present

## 2021-11-05 DIAGNOSIS — C4441 Basal cell carcinoma of skin of scalp and neck: Secondary | ICD-10-CM | POA: Diagnosis not present

## 2021-11-05 DIAGNOSIS — L821 Other seborrheic keratosis: Secondary | ICD-10-CM | POA: Diagnosis not present

## 2021-11-05 DIAGNOSIS — L578 Other skin changes due to chronic exposure to nonionizing radiation: Secondary | ICD-10-CM | POA: Diagnosis not present

## 2021-11-05 DIAGNOSIS — L570 Actinic keratosis: Secondary | ICD-10-CM | POA: Diagnosis not present

## 2021-11-05 DIAGNOSIS — Z85820 Personal history of malignant melanoma of skin: Secondary | ICD-10-CM | POA: Diagnosis not present

## 2021-11-12 DIAGNOSIS — C4441 Basal cell carcinoma of skin of scalp and neck: Secondary | ICD-10-CM | POA: Diagnosis not present

## 2021-12-05 ENCOUNTER — Other Ambulatory Visit: Payer: Self-pay | Admitting: Cardiology

## 2021-12-07 NOTE — Telephone Encounter (Signed)
Prescription refill request for Eliquis received. ?Indication:Aflutter ?Last office visit:11/22 ?[id number] ?Age: 88 ?Weight:67.6 kg ? ?Prescription refilled ? ?

## 2021-12-11 DIAGNOSIS — C4441 Basal cell carcinoma of skin of scalp and neck: Secondary | ICD-10-CM | POA: Diagnosis not present

## 2021-12-11 DIAGNOSIS — L300 Nummular dermatitis: Secondary | ICD-10-CM | POA: Diagnosis not present

## 2021-12-11 DIAGNOSIS — L299 Pruritus, unspecified: Secondary | ICD-10-CM | POA: Diagnosis not present

## 2021-12-18 DIAGNOSIS — E782 Mixed hyperlipidemia: Secondary | ICD-10-CM | POA: Diagnosis not present

## 2021-12-18 DIAGNOSIS — S62609A Fracture of unspecified phalanx of unspecified finger, initial encounter for closed fracture: Secondary | ICD-10-CM | POA: Diagnosis not present

## 2021-12-18 DIAGNOSIS — E1159 Type 2 diabetes mellitus with other circulatory complications: Secondary | ICD-10-CM | POA: Diagnosis not present

## 2021-12-18 DIAGNOSIS — K862 Cyst of pancreas: Secondary | ICD-10-CM | POA: Diagnosis not present

## 2021-12-18 DIAGNOSIS — Z6821 Body mass index (BMI) 21.0-21.9, adult: Secondary | ICD-10-CM | POA: Diagnosis not present

## 2021-12-18 DIAGNOSIS — Z0000 Encounter for general adult medical examination without abnormal findings: Secondary | ICD-10-CM | POA: Diagnosis not present

## 2021-12-23 ENCOUNTER — Other Ambulatory Visit: Payer: Self-pay | Admitting: Cardiology

## 2021-12-23 NOTE — Telephone Encounter (Signed)
Prescription refill request for Eliquis received. ?Indication:Aflutter ?Last office visit:11/22 ?Scr:1.1 ?Age: 88 ?Weight:67.6 kg ? ?Prescription refilled ? ?

## 2021-12-25 DIAGNOSIS — C4441 Basal cell carcinoma of skin of scalp and neck: Secondary | ICD-10-CM | POA: Diagnosis not present

## 2021-12-25 DIAGNOSIS — L300 Nummular dermatitis: Secondary | ICD-10-CM | POA: Diagnosis not present

## 2021-12-29 DIAGNOSIS — Z9049 Acquired absence of other specified parts of digestive tract: Secondary | ICD-10-CM | POA: Diagnosis not present

## 2021-12-29 DIAGNOSIS — K862 Cyst of pancreas: Secondary | ICD-10-CM | POA: Diagnosis not present

## 2022-01-01 DIAGNOSIS — K862 Cyst of pancreas: Secondary | ICD-10-CM | POA: Diagnosis not present

## 2022-01-01 DIAGNOSIS — S62609A Fracture of unspecified phalanx of unspecified finger, initial encounter for closed fracture: Secondary | ICD-10-CM | POA: Diagnosis not present

## 2022-01-01 DIAGNOSIS — Z6821 Body mass index (BMI) 21.0-21.9, adult: Secondary | ICD-10-CM | POA: Diagnosis not present

## 2022-01-05 ENCOUNTER — Encounter: Payer: Self-pay | Admitting: Cardiology

## 2022-01-05 ENCOUNTER — Ambulatory Visit: Payer: Medicare Other | Admitting: Cardiology

## 2022-01-05 VITALS — BP 128/80 | HR 64 | Ht 71.0 in | Wt 145.6 lb

## 2022-01-05 DIAGNOSIS — I10 Essential (primary) hypertension: Secondary | ICD-10-CM

## 2022-01-05 DIAGNOSIS — E785 Hyperlipidemia, unspecified: Secondary | ICD-10-CM | POA: Diagnosis not present

## 2022-01-05 DIAGNOSIS — Z951 Presence of aortocoronary bypass graft: Secondary | ICD-10-CM | POA: Diagnosis not present

## 2022-01-05 DIAGNOSIS — I6522 Occlusion and stenosis of left carotid artery: Secondary | ICD-10-CM

## 2022-01-05 NOTE — Patient Instructions (Signed)

## 2022-01-05 NOTE — Progress Notes (Signed)
?Cardiology Office Note:   ? ?Date:  01/05/2022  ? ?ID:  Joseph Mcmillan, DOB 06/04/1934, MRN [id number] ? ?PCP:  Mateo Flow, MD  ?Cardiologist:  Jenne Campus, MD   ? ?Referring MD: Mateo Flow, MD  ? ?Chief Complaint  ?Patient presents with  ? Follow-up  ? Dizziness  ? ? ?History of Present Illness:   ? ?Joseph Mcmillan is a 88 y.o. male with past medical history significant for coronary artery disease status post coronary artery bypass graft done years ago, also status post carotic endarterectomy done in 2018, essential hypertension, dyslipidemia.  Last evaluation of his carotic artery was done in September 2022 with up to 39% stenosis in the right side, last echocardiogram done in September 2022 showing ejection fraction 50 to 55% without segmental wall motion abnormalities. ?Comes today to my office to follow-up overall he is doing very well.  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 ?Past Medical History:  ?Diagnosis Date  ? Aortic calcification (HCC)   ? Seen on CT scan  ? Blepharitis of upper and lower eyelids of both eyes 11/29/2017  ? BPH (benign prostatic hypertrophy)   ? CAD (coronary artery disease)   ? Coronary artery calcification noted on CT scan  ? Carotid artery stenosis, symptomatic, left - s/p CEA 07/26/2017  ? Cataract, right eye 1994  ? Extracted with lens implant  ? Deprivation amblyopia 08/24/2011  ? Dermatochalasis 08/24/2011  ? Essential hypertension 07/26/2017  ? Fatigue due to excessive exertion 07/26/2017  ? History of kidney stones   ? History of left-sided carotid endarterectomy 07/26/2017  ? History of TIAs 2011  ? Status post left carotid endarterectomy  ? Hyperlipidemia   ?  Hyperlipidemia with target LDL less than 70 07/26/2017  ? Hypertension   ? Keratoconjunctivitis sicca of both eyes not specified as Sjogren's 11/29/2017  ? Left-sided carotid artery disease ([place]) 02/2010  ? Left carotid endarterectomy  ? Malnutrition of moderate degree 04/10/2018  ? Mature cataract 08/24/2011  ? Occlusion and stenosis of carotid artery without mention of cerebral infarction 10/30/2012  ? Paroxysmal atrial flutter ([place]) 05/19/2018  ? Pernicious anemia   ? Posterior capsular opacification of right eye, not obscuring vision 08/24/2011  ? Posterior vitreous detachment 08/24/2011  ? Presbyopia of both eyes 03/01/2017  ? Pseudophakia 08/24/2011  ? S/P CABG x 5 04/04/2018  ? Weakness of both lower extremities 07/26/2017  ? ? ?Past Surgical History:  ?Procedure Laterality Date  ? CARDIAC CATHETERIZATION  03/30/2018  ? CAROTID ARTERY ANGIOPLASTY Left   ? CAROTID ENDARTERECTOMY  03-06-2010  ? Left CEA  ? CATARACT EXTRACTION W/ INTRAOCULAR LENS IMPLANT Right   ? CHOLECYSTECTOMY    ? COLONOSCOPY  04/17/2014  ? Moderate sigmoid diverticulosis. Otherwise, normal colonoscopy  ? CORONARY ARTERY BYPASS GRAFT N/A 04/04/2018  ? Procedure: CORONARY ARTERY BYPASS GRAFTING (CABG)TIMES 5, USING LEFT  INTERNAL MAMMARY ARTERY AND ENDOSCOPICALLY HARVESTED RIGHT AND LEFT SAPHENOUS VEIN;  Surgeon: Gaye Pollack, MD;  Location: [place];  Service: Open Heart Surgery;  Laterality: N/A;  ? ENDOVEIN HARVEST OF GREATER SAPHENOUS VEIN Right 04/04/2018  ? Procedure:  ENDOVEIN HARVEST OF GREATER SAPHENOUS VEIN;  Surgeon: Gaye Pollack, MD;  Location: [place];  Service: Open Heart Surgery;  Laterality: Right;  ? LEFT HEART CATH AND CORONARY ANGIOGRAPHY N/A 03/30/2018  ? Procedure: LEFT HEART CATH AND CORONARY ANGIOGRAPHY;  Surgeon: Jettie Booze, MD;  Location: [place] [facility];  Service: Cardiovascular;  Laterality: N/A;  ? MELANOMA EXCISION    ? TEE WITHOUT CARDIOVERSION N/A 04/04/2018  ? Procedure: TRANSESOPHAGEAL ECHOCARDIOGRAM (TEE);  Surgeon:  Gaye Pollack, MD;  Location: [place];  Service: Open Heart Surgery;  Laterality: N/A;  ? ? ?Current Medications: ?Current Meds  ?Medication Sig  ? apixaban (ELIQUIS) 5 MG TABS tablet Take 1 tablet (5 mg total) by mouth 2 (two) times daily.  ? atorvastatin (LIPITOR) 10 MG tablet TAKE ONE (1) TABLET ONCE DAILY (Patient taking differently: Take 10 mg by mouth daily.)  ? metFORMIN (GLUCOPHAGE-XR) 750 MG 24 hr tablet Take 750 mg by mouth daily.  ? metoprolol tartrate (LOPRESSOR) 25 MG tablet TAKE ONE TABLET TWICE DAILY (Patient taking differently: Take 50 mg by mouth 2 (two) times daily.)  ? nitroGLYCERIN (NITROSTAT) 0.4 MG SL tablet Place 1 tablet under the tongue as needed for chest pain.  ?  ? ?Allergies:   Codeine  ? ?Social History  ? ?Socioeconomic History  ? Marital status: Married  ?  Spouse name: Not on file  ? Number of children: Not on file  ? Years of education: Not on file  ? Highest education level: Not on file  ?Occupational History  ? Occupation: Retired  ?Tobacco Use  ? Smoking status: Never  ? Smokeless tobacco: Never  ?Vaping Use  ? Vaping Use: Never used  ?Substance and Sexual Activity  ? Alcohol use: No  ? Drug use: No  ? Sexual activity: Not on file  ?Other Topics Concern  ? Not on file  ?Social History Narrative  ? Not on file  ? ?Social Determinants of Health  ? ?Financial Resource Strain: Not on file  ?Food Insecurity: Not on file  ?Transportation Needs: Not on file  ?Physical Activity: Not on file  ?Stress: Not on file  ?Social Connections: Not on file  ?  ? ?Family History: ?The patient's family history includes Cancer in his mother; Stroke in his father. ?ROS:   ?Please see the history of present illness.    ?All 14 point review of systems negative except as described per history of present illness ? ?EKGs/Labs/Other Studies Reviewed:   ? ? ? ?Recent Labs: ?No results found for requested labs within last 8760 hours.  ?Recent Lipid Panel ?   ?Component Value Date/Time  ? CHOL 139 02/05/2020  1549  ? TRIG 104 02/05/2020 1549  ? HDL 59 02/05/2020 1549  ? CHOLHDL 2.4 02/05/2020 1549  ? CHOLHDL 2.6 03/31/2018 0846  ? VLDL 19 03/31/2018 0846  ? [place] 61 02/05/2020 1549  ? ? ?Physical Exam:   ? ?VS:  BP 128/80 (BP Location: Left Arm, Patient Position: Sitting)   Pulse 64   Ht '5\' 11"'$  (1.803 m)   Wt 145 lb 9.6 oz (66 kg)   SpO2 96%   BMI 20.31 kg/m?    ? ?Wt Readings from Last 3 Encounters:  ?01/05/22 145 lb 9.6 oz (66 kg)  ?07/08/21 149 lb (67.6 kg)  ?04/07/21 148 lb 12.8 oz (67.5 kg)  ?  ? ?GEN:  Well nourished, well developed in no acute distress ?HEENT: Normal ?NECK: No JVD; No carotid bruits ?LYMPHATICS: No  lymphadenopathy ?CARDIAC: RRR, no murmurs, no rubs, no gallops ?RESPIRATORY:  Clear to auscultation without rales, wheezing or rhonchi  ?ABDOMEN: Soft, non-tender, non-distended ?MUSCULOSKELETAL:  No edema; No deformity  ?SKIN: Warm and dry ?LOWER EXTREMITIES: no swelling ?NEUROLOGIC:  Alert and oriented x 3 ?PSYCHIATRIC:  Normal affect  ? ?ASSESSMENT:   ? ?1. Carotid artery stenosis, symptomatic, left - s/p CEA   ?2. Essential hypertension   ?3. S/P CABG x 5   ?4. Hyperlipidemia with target LDL less than 70   ? ?PLAN:   ? ?In order of problems listed above: ? ?Coronary artery disease doing well from that point review asymptomatic we will continue present medication which include statin therapy ?Dyslipidemia I did review K PN which show me his LDL of 61 HDL 60 we will continue present management. ?Carotic artery stenosis.  Carotic ultrasounds reviewed from last year only up to 39% stenosis next year or did not of this year we will repeat the test we have ?We had a long discussion about healthy lifestyle need to exercise on the regular basis which she is already doing ? ? ?Medication Adjustments/Labs and Tests Ordered: ?Current medicines are reviewed at length with the patient today.  Concerns regarding medicines are outlined above.  ?No orders of the defined types were placed in this  encounter. ? ?Medication changes: No orders of the defined types were placed in this encounter. ? ? ?Signed, ?Park Liter, MD, [facility] ?01/05/2022 2:12 PM    ?[place] ?

## 2022-01-08 ENCOUNTER — Other Ambulatory Visit: Payer: Self-pay | Admitting: Cardiology

## 2022-01-11 NOTE — Telephone Encounter (Signed)
Rx refill sent to pharmacy. 

## 2022-02-03 DIAGNOSIS — M79641 Pain in right hand: Secondary | ICD-10-CM | POA: Diagnosis not present

## 2022-02-03 DIAGNOSIS — M20011 Mallet finger of right finger(s): Secondary | ICD-10-CM | POA: Diagnosis not present

## 2022-02-03 DIAGNOSIS — S62636A Displaced fracture of distal phalanx of right little finger, initial encounter for closed fracture: Secondary | ICD-10-CM | POA: Diagnosis not present

## 2022-02-03 DIAGNOSIS — M25641 Stiffness of right hand, not elsewhere classified: Secondary | ICD-10-CM | POA: Diagnosis not present

## 2022-02-17 DIAGNOSIS — M25641 Stiffness of right hand, not elsewhere classified: Secondary | ICD-10-CM | POA: Diagnosis not present

## 2022-02-17 DIAGNOSIS — M20011 Mallet finger of right finger(s): Secondary | ICD-10-CM | POA: Diagnosis not present

## 2022-02-17 DIAGNOSIS — S62636A Displaced fracture of distal phalanx of right little finger, initial encounter for closed fracture: Secondary | ICD-10-CM | POA: Diagnosis not present

## 2022-03-10 DIAGNOSIS — M20011 Mallet finger of right finger(s): Secondary | ICD-10-CM | POA: Diagnosis not present

## 2022-03-10 DIAGNOSIS — M25641 Stiffness of right hand, not elsewhere classified: Secondary | ICD-10-CM | POA: Diagnosis not present

## 2022-03-10 DIAGNOSIS — S62636A Displaced fracture of distal phalanx of right little finger, initial encounter for closed fracture: Secondary | ICD-10-CM | POA: Diagnosis not present

## 2022-03-22 DIAGNOSIS — H02831 Dermatochalasis of right upper eyelid: Secondary | ICD-10-CM | POA: Diagnosis not present

## 2022-03-22 DIAGNOSIS — E119 Type 2 diabetes mellitus without complications: Secondary | ICD-10-CM | POA: Diagnosis not present

## 2022-03-22 DIAGNOSIS — H524 Presbyopia: Secondary | ICD-10-CM | POA: Diagnosis not present

## 2022-03-22 DIAGNOSIS — H53022 Refractive amblyopia, left eye: Secondary | ICD-10-CM | POA: Diagnosis not present

## 2022-03-22 DIAGNOSIS — H43821 Vitreomacular adhesion, right eye: Secondary | ICD-10-CM | POA: Diagnosis not present

## 2022-03-22 DIAGNOSIS — H16223 Keratoconjunctivitis sicca, not specified as Sjogren's, bilateral: Secondary | ICD-10-CM | POA: Diagnosis not present

## 2022-03-22 DIAGNOSIS — H268 Other specified cataract: Secondary | ICD-10-CM | POA: Diagnosis not present

## 2022-03-22 DIAGNOSIS — Z7984 Long term (current) use of oral hypoglycemic drugs: Secondary | ICD-10-CM | POA: Diagnosis not present

## 2022-03-22 DIAGNOSIS — H02834 Dermatochalasis of left upper eyelid: Secondary | ICD-10-CM | POA: Diagnosis not present

## 2022-03-22 DIAGNOSIS — H43811 Vitreous degeneration, right eye: Secondary | ICD-10-CM | POA: Diagnosis not present

## 2022-03-22 DIAGNOSIS — Z961 Presence of intraocular lens: Secondary | ICD-10-CM | POA: Diagnosis not present

## 2022-03-22 DIAGNOSIS — H26491 Other secondary cataract, right eye: Secondary | ICD-10-CM | POA: Diagnosis not present

## 2022-03-24 DIAGNOSIS — S62636A Displaced fracture of distal phalanx of right little finger, initial encounter for closed fracture: Secondary | ICD-10-CM | POA: Diagnosis not present

## 2022-03-24 DIAGNOSIS — M20011 Mallet finger of right finger(s): Secondary | ICD-10-CM | POA: Diagnosis not present

## 2022-03-24 DIAGNOSIS — M25641 Stiffness of right hand, not elsewhere classified: Secondary | ICD-10-CM | POA: Diagnosis not present

## 2022-04-05 DIAGNOSIS — L578 Other skin changes due to chronic exposure to nonionizing radiation: Secondary | ICD-10-CM | POA: Diagnosis not present

## 2022-04-05 DIAGNOSIS — Z85820 Personal history of malignant melanoma of skin: Secondary | ICD-10-CM | POA: Diagnosis not present

## 2022-04-05 DIAGNOSIS — L821 Other seborrheic keratosis: Secondary | ICD-10-CM | POA: Diagnosis not present

## 2022-04-05 DIAGNOSIS — L570 Actinic keratosis: Secondary | ICD-10-CM | POA: Diagnosis not present

## 2022-04-07 DIAGNOSIS — M20011 Mallet finger of right finger(s): Secondary | ICD-10-CM | POA: Diagnosis not present

## 2022-04-07 DIAGNOSIS — M25641 Stiffness of right hand, not elsewhere classified: Secondary | ICD-10-CM | POA: Diagnosis not present

## 2022-04-07 DIAGNOSIS — S62636A Displaced fracture of distal phalanx of right little finger, initial encounter for closed fracture: Secondary | ICD-10-CM | POA: Diagnosis not present

## 2022-04-21 DIAGNOSIS — E1159 Type 2 diabetes mellitus with other circulatory complications: Secondary | ICD-10-CM | POA: Diagnosis not present

## 2022-04-21 DIAGNOSIS — I2510 Atherosclerotic heart disease of native coronary artery without angina pectoris: Secondary | ICD-10-CM | POA: Diagnosis not present

## 2022-04-21 DIAGNOSIS — Z6821 Body mass index (BMI) 21.0-21.9, adult: Secondary | ICD-10-CM | POA: Diagnosis not present

## 2022-06-08 DIAGNOSIS — R112 Nausea with vomiting, unspecified: Secondary | ICD-10-CM | POA: Diagnosis not present

## 2022-06-15 DIAGNOSIS — E119 Type 2 diabetes mellitus without complications: Secondary | ICD-10-CM | POA: Diagnosis not present

## 2022-06-15 DIAGNOSIS — E1165 Type 2 diabetes mellitus with hyperglycemia: Secondary | ICD-10-CM | POA: Diagnosis not present

## 2022-06-15 DIAGNOSIS — R112 Nausea with vomiting, unspecified: Secondary | ICD-10-CM | POA: Diagnosis not present

## 2022-07-12 ENCOUNTER — Other Ambulatory Visit: Payer: Self-pay | Admitting: Cardiology

## 2022-07-12 NOTE — Telephone Encounter (Signed)
Refill sent to pharmacy.   

## 2022-07-29 DIAGNOSIS — Z6821 Body mass index (BMI) 21.0-21.9, adult: Secondary | ICD-10-CM | POA: Diagnosis not present

## 2022-07-29 DIAGNOSIS — H6120 Impacted cerumen, unspecified ear: Secondary | ICD-10-CM | POA: Diagnosis not present

## 2022-08-03 DIAGNOSIS — L820 Inflamed seborrheic keratosis: Secondary | ICD-10-CM | POA: Diagnosis not present

## 2022-08-03 DIAGNOSIS — L578 Other skin changes due to chronic exposure to nonionizing radiation: Secondary | ICD-10-CM | POA: Diagnosis not present

## 2022-08-03 DIAGNOSIS — L821 Other seborrheic keratosis: Secondary | ICD-10-CM | POA: Diagnosis not present

## 2022-08-25 ENCOUNTER — Other Ambulatory Visit: Payer: Self-pay

## 2022-08-26 ENCOUNTER — Ambulatory Visit: Payer: Medicare Other | Attending: Cardiology | Admitting: Cardiology

## 2022-08-26 ENCOUNTER — Encounter: Payer: Self-pay | Admitting: Cardiology

## 2022-08-26 VITALS — BP 138/70 | HR 90 | Ht 70.6 in | Wt 144.6 lb

## 2022-08-26 DIAGNOSIS — I2510 Atherosclerotic heart disease of native coronary artery without angina pectoris: Secondary | ICD-10-CM

## 2022-08-26 DIAGNOSIS — Z951 Presence of aortocoronary bypass graft: Secondary | ICD-10-CM

## 2022-08-26 DIAGNOSIS — I10 Essential (primary) hypertension: Secondary | ICD-10-CM | POA: Diagnosis not present

## 2022-08-26 DIAGNOSIS — I4892 Unspecified atrial flutter: Secondary | ICD-10-CM | POA: Diagnosis not present

## 2022-08-26 DIAGNOSIS — I6522 Occlusion and stenosis of left carotid artery: Secondary | ICD-10-CM | POA: Diagnosis not present

## 2022-08-26 DIAGNOSIS — Z8673 Personal history of transient ischemic attack (TIA), and cerebral infarction without residual deficits: Secondary | ICD-10-CM

## 2022-08-26 NOTE — Patient Instructions (Signed)
Medication Instructions:  Your physician recommends that you continue on your current medications as directed. Please refer to the Current Medication list given to you today.  *If you need a refill on your cardiac medications before your next appointment, please call your pharmacy*   Lab Work: Your physician recommends that you return for lab work in: the next few days. You need to have labs done when you are fasting.  You can come Monday through Friday 8:30 am to 12:00 pm and 1:15 to 4:30. You do not need to make an appointment as the order has already been placed. The labs you are going to have done are CMET, CBC, TSH, A1C, vitamin D and Lipids.  If you have labs (blood work) drawn today and your tests are completely normal, you will receive your results only by: Dawn (if you have MyChart) OR A paper copy in the mail If you have any lab test that is abnormal or we need to change your treatment, we will call you to review the results.   Testing/Procedures: None ordered   Follow-Up: At [facility], you and your health needs are our priority.  As part of our continuing mission to provide you with exceptional heart care, we have created designated Provider Care Teams.  These Care Teams include your primary Cardiologist (physician) and Advanced Practice Providers (APPs -  Physician Assistants and Nurse Practitioners) who all work together to provide you with the care you need, when you need it.  We recommend signing up for the patient portal called "MyChart".  Sign up information is provided on this After Visit Summary.  MyChart is used to connect with patients for Virtual Visits (Telemedicine).  Patients are able to view lab/test results, encounter notes, upcoming appointments, etc.  Non-urgent messages can be sent to your provider as well.   To learn more about what you can do with MyChart, go to [email or web address].    Your next appointment:   6 month(s)  The  format for your next appointment:   In Person  Provider:   Jyl Heinz, MD    Other Instructions none  Important Information About [place]

## 2022-08-26 NOTE — Progress Notes (Signed)
Cardiology Office Note:    Date:  08/26/2022   ID:  Joseph Mcmillan, DOB 1934-06-29, MRN [id number]  PCP:  Mateo Flow, MD  Cardiologist:  Jenean Lindau, MD   Referring MD: Mateo Flow, MD    ASSESSMENT:    1. Coronary artery disease involving native coronary artery of native heart without angina pectoris   2. Carotid artery stenosis, symptomatic, left - s/p CEA   3. Essential hypertension   4. Paroxysmal atrial flutter (HCC)   5. History of TIAs   6. S/P CABG x 5    PLAN:    In order of problems listed above:  Coronary artery disease post CABG surgery: Secondary prevention stressed with the patient.  Importance of compliance with diet medication stressed and vocalized understanding.  He was advised to walk at least half an hour a day 5 days a week and he promises to do so. Essential hypertension: Blood pressure is stable and diet was emphasized.  Lifestyle modification urged with Mixed dyslipidemia: On lipid-lowering medications.  He was advised to come in the morning for blood work and he will get a complete blood work. History of atrial flutter on anticoagulation:I discussed with the patient atrial flutter, disease process. Management and therapy including rate and rhythm control, anticoagulation benefits and potential risks were discussed extensively with the patient. Patient had multiple questions which were answered to patient's satisfaction. Patient will be seen in follow-up appointment in 6 months or earlier if the patient has any concerns    Medication Adjustments/Labs and Tests Ordered: Current medicines are reviewed at length with the patient today.  Concerns regarding medicines are outlined above.  No orders of the defined types were placed in this encounter.  No orders of the defined types were placed in this encounter.    No chief complaint on file.    History of Present Illness:    Joseph Mcmillan is a 89 y.o. male.  Patient has past medical history of  coronary artery disease post CABG surgery, essential hypertension, dyslipidemia.  He denies any problems at this time and takes care of activities of daily living.  No chest pain orthopnea or PND.  At the time of my evaluation, the patient is alert awake oriented and in no distress.  Past Medical History:  Diagnosis Date   Aortic calcification (HCC)    Seen on CT scan   Blepharitis of upper and lower eyelids of both eyes 11/29/2017   CAD (coronary artery disease)    Coronary artery calcification noted on CT scan   Carotid artery stenosis, symptomatic, left - s/p CEA 07/26/2017   Cataract, right eye 1994   Extracted with lens implant   Deprivation amblyopia 08/24/2011   Dermatochalasis 08/24/2011   Essential hypertension 07/26/2017   Fatigue due to excessive exertion 07/26/2017   History of kidney stones    History of left-sided carotid endarterectomy 07/26/2017   History of TIAs 2011   Status post left carotid endarterectomy   Hyperlipidemia    Hyperlipidemia with target LDL less than 70 07/26/2017   Hypertension    Keratoconjunctivitis sicca of both eyes not specified as Sjogren's 11/29/2017   Left-sided carotid artery disease ([place]) 02/2010   Left carotid endarterectomy   Malnutrition of moderate degree 04/10/2018   Mature cataract 08/24/2011   Occlusion and stenosis of carotid artery without mention of cerebral infarction 10/30/2012   Paroxysmal atrial flutter ([place]) 05/19/2018   Pernicious anemia    Posterior capsular opacification of right  eye, not obscuring vision 08/24/2011   Posterior vitreous detachment 08/24/2011   Presbyopia of both eyes 03/01/2017   Pseudophakia 08/24/2011   S/P CABG x 5 04/04/2018   Weakness of both lower extremities 07/26/2017    Past Surgical History:  Procedure Laterality Date   CARDIAC CATHETERIZATION  03/30/2018   CAROTID ARTERY ANGIOPLASTY Left    CAROTID ENDARTERECTOMY  03-06-2010   Left CEA   CATARACT EXTRACTION W/ INTRAOCULAR LENS  IMPLANT Right    CHOLECYSTECTOMY     COLONOSCOPY  04/17/2014   Moderate sigmoid diverticulosis. Otherwise, normal colonoscopy   CORONARY ARTERY BYPASS GRAFT N/A 04/04/2018   Procedure: CORONARY ARTERY BYPASS GRAFTING (CABG)TIMES 5, USING LEFT  INTERNAL MAMMARY ARTERY AND ENDOSCOPICALLY HARVESTED RIGHT AND LEFT SAPHENOUS VEIN;  Surgeon: Gaye Pollack, MD;  Location: [place];  Service: Open Heart Surgery;  Laterality: N/A;   ENDOVEIN HARVEST OF GREATER SAPHENOUS VEIN Right 04/04/2018   Procedure: ENDOVEIN HARVEST OF GREATER SAPHENOUS VEIN;  Surgeon: Gaye Pollack, MD;  Location: [place];  Service: Open Heart Surgery;  Laterality: Right;   LEFT HEART CATH AND CORONARY ANGIOGRAPHY N/A 03/30/2018   Procedure: LEFT HEART CATH AND CORONARY ANGIOGRAPHY;  Surgeon: Jettie Booze, MD;  Location: [place] [facility];  Service: Cardiovascular;  Laterality: N/A;   MELANOMA EXCISION     TEE WITHOUT CARDIOVERSION N/A 04/04/2018   Procedure: TRANSESOPHAGEAL ECHOCARDIOGRAM (TEE);  Surgeon: Gaye Pollack, MD;  Location: [place];  Service: Open Heart Surgery;  Laterality: N/A;    Current Medications: Current Meds  Medication Sig   apixaban (ELIQUIS) 5 MG TABS tablet Take 1 tablet (5 mg total) by mouth 2 (two) times daily.   atorvastatin (LIPITOR) 10 MG tablet Take 1 tablet (10 mg total) by mouth daily.   metFORMIN (GLUCOPHAGE-XR) 750 MG 24 hr tablet Take 750 mg by mouth daily.   metoprolol tartrate (LOPRESSOR) 25 MG tablet Take 25 mg by mouth 2 (two) times daily.   nitroGLYCERIN (NITROSTAT) 0.4 MG SL tablet Place 1 tablet under the tongue as needed for chest pain.     Allergies:   Codeine   Social History   Socioeconomic History   Marital status: Married    Spouse name: Not on file   Number of children: Not on file   Years of education: Not on file   Highest education level: Not on file  Occupational History   Occupation: Retired  Tobacco Use   Smoking status: Never   Smokeless tobacco: Never   Vaping Use   Vaping Use: Never used  Substance and Sexual Activity   Alcohol use: No   Drug use: No   Sexual activity: Not on file  Other Topics Concern   Not on file  Social History Narrative   Not on file   Social Determinants of Health   Financial Resource Strain: Low Risk  (03/30/2018)   Overall Financial Resource Strain (CARDIA)    Difficulty of Paying Living Expenses: Not hard at all  Food Insecurity: No Food Insecurity (03/30/2018)   Hunger Vital Sign    Worried About Running Out of Food in the Last Year: Never true    [place] in the Last Year: Never true  Transportation Needs: No Transportation Needs (03/30/2018)   PRAPARE - Hydrologist (Medical): No    Lack of Transportation (Non-Medical): No  Physical Activity: Inactive (03/30/2018)   Exercise Vital Sign    Days of Exercise per Week: 0  days    Minutes of Exercise per Session: 0 min  Stress: No Stress Concern Present (03/30/2018)   [place]    Feeling of Stress : Only a little  Social Connections: Moderately Integrated (03/30/2018)   Social Connection and Isolation Panel [NHANES]    Frequency of Communication with Friends and Family: Three times a week    Frequency of Social Gatherings with Friends and Family: Three times a week    Attends Religious Services: More than 4 times per year    Active Member of Clubs or Organizations: No    Attends Archivist Meetings: Never    Marital Status: Married     Family History: The patient's family history includes Cancer in his mother; Stroke in his father.  ROS:   Please see the history of present illness.    All other systems reviewed and are negative.  EKGs/Labs/Other Studies Reviewed:    The following studies were reviewed today: EKG reveals sinus rhythm and nonspecific ST-T changes   Recent Labs: No results found for requested labs within last 365 days.   Recent Lipid Panel    Component Value Date/Time   CHOL 139 02/05/2020 1549   TRIG 104 02/05/2020 1549   HDL 59 02/05/2020 1549   CHOLHDL 2.4 02/05/2020 1549   CHOLHDL 2.6 03/31/2018 0846   VLDL 19 03/31/2018 0846   LDLCALC 61 02/05/2020 1549    Physical Exam:    VS:  BP 138/70   Pulse 90   Ht 5' 10.6" (1.793 m)   Wt 144 lb 9.6 oz (65.6 kg)   SpO2 96%   BMI 20.40 kg/m     Wt Readings from Last 3 Encounters:  08/26/22 144 lb 9.6 oz (65.6 kg)  01/05/22 145 lb 9.6 oz (66 kg)  07/08/21 149 lb (67.6 kg)     GEN: Patient is in no acute distress HEENT: Normal NECK: No JVD; No carotid bruits LYMPHATICS: No lymphadenopathy CARDIAC: Hear sounds regular, 2/6 systolic murmur at the apex. RESPIRATORY:  Clear to auscultation without rales, wheezing or rhonchi  ABDOMEN: Soft, non-tender, non-distended MUSCULOSKELETAL:  No edema; No deformity  SKIN: Warm and dry NEUROLOGIC:  Alert and oriented x 3 PSYCHIATRIC:  Normal affect   Signed, Jenean Lindau, MD  08/26/2022 9:41 AM    [place]

## 2022-09-03 DIAGNOSIS — Z951 Presence of aortocoronary bypass graft: Secondary | ICD-10-CM | POA: Diagnosis not present

## 2022-09-03 DIAGNOSIS — I2510 Atherosclerotic heart disease of native coronary artery without angina pectoris: Secondary | ICD-10-CM | POA: Diagnosis not present

## 2022-09-03 DIAGNOSIS — I6522 Occlusion and stenosis of left carotid artery: Secondary | ICD-10-CM | POA: Diagnosis not present

## 2022-09-03 DIAGNOSIS — I10 Essential (primary) hypertension: Secondary | ICD-10-CM | POA: Diagnosis not present

## 2022-09-03 DIAGNOSIS — Z131 Encounter for screening for diabetes mellitus: Secondary | ICD-10-CM | POA: Diagnosis not present

## 2022-09-03 DIAGNOSIS — Z1321 Encounter for screening for nutritional disorder: Secondary | ICD-10-CM | POA: Diagnosis not present

## 2022-09-03 DIAGNOSIS — Z8673 Personal history of transient ischemic attack (TIA), and cerebral infarction without residual deficits: Secondary | ICD-10-CM | POA: Diagnosis not present

## 2022-09-04 LAB — CBC
Hematocrit: 40.2 % (ref 37.5–51.0)
Hemoglobin: 13.8 g/dL (ref 13.0–17.7)
MCH: 31.1 pg (ref 26.6–33.0)
MCHC: 34.3 g/dL (ref 31.5–35.7)
MCV: 91 fL (ref 79–97)
Platelets: 259 10*3/uL (ref 150–450)
RBC: 4.44 x10E6/uL (ref 4.14–5.80)
RDW: 12.3 % (ref 11.6–15.4)
WBC: 5.6 10*3/uL (ref 3.4–10.8)

## 2022-09-04 LAB — COMPREHENSIVE METABOLIC PANEL
ALT: 12 IU/L (ref 0–44)
AST: 19 IU/L (ref 0–40)
Albumin/Globulin Ratio: 2 (ref 1.2–2.2)
Albumin: 4.4 g/dL (ref 3.7–4.7)
Alkaline Phosphatase: 80 IU/L (ref 44–121)
BUN/Creatinine Ratio: 11 (ref 10–24)
BUN: 12 mg/dL (ref 8–27)
Bilirubin Total: 0.7 mg/dL (ref 0.0–1.2)
CO2: 27 mmol/L (ref 20–29)
Calcium: 9.4 mg/dL (ref 8.6–10.2)
Chloride: 101 mmol/L (ref 96–106)
Creatinine, Ser: 1.13 mg/dL (ref 0.76–1.27)
Globulin, Total: 2.2 g/dL (ref 1.5–4.5)
Glucose: 129 mg/dL — ABNORMAL HIGH (ref 70–99)
Potassium: 4.7 mmol/L (ref 3.5–5.2)
Sodium: 140 mmol/L (ref 134–144)
Total Protein: 6.6 g/dL (ref 6.0–8.5)
eGFR: 63 mL/min/{1.73_m2} (ref >59)

## 2022-09-04 LAB — VITAMIN D 25 HYDROXY (VIT D DEFICIENCY, FRACTURES): Vit D, 25-Hydroxy: 28.5 ng/mL — ABNORMAL LOW (ref 30.0–100.0)

## 2022-09-04 LAB — LIPID PANEL
Chol/HDL Ratio: 2.7 ratio (ref 0.0–5.0)
Cholesterol, Total: 161 mg/dL (ref 100–199)
HDL: 60 mg/dL (ref >39)
LDL Chol Calc (NIH): 77 mg/dL (ref 0–99)
Triglycerides: 141 mg/dL (ref 0–149)
VLDL Cholesterol Cal: 24 mg/dL (ref 5–40)

## 2022-09-04 LAB — HEMOGLOBIN A1C
Est. average glucose Bld gHb Est-mCnc: 148 mg/dL
Hgb A1c MFr Bld: 6.8 % — ABNORMAL HIGH (ref 4.8–5.6)

## 2022-09-04 LAB — TSH: TSH: 3.36 u[IU]/mL (ref 0.450–4.500)

## 2022-09-08 ENCOUNTER — Other Ambulatory Visit: Payer: Self-pay | Admitting: Cardiology

## 2022-09-08 NOTE — Telephone Encounter (Signed)
Prescription refill request for Eliquis received. Indication: A Flutter Last office visit: 08/26/22  R Revankar MD Scr: 1.13 on 09/03/22 Age: 89 Weight: 65.6kg  Based on above findings Eliquis '5mg'$  twice daily is the appropriate dose.  Refill approved.

## 2022-10-19 ENCOUNTER — Telehealth: Payer: Self-pay

## 2022-10-19 NOTE — Patient Outreach (Signed)
  Care Coordination   Initial Visit Note   10/19/2022 Name: CHAISE GOLDSBORO MRN: [id number] DOB: [birth year]  TAESHAWN BRANDENBERGER is a 89 y.o. year old male who sees Mateo Flow, MD for primary care. I spoke with  [name] by phone today.  What matters to the patients health and wellness today?  Placed call to patient today to review and offer [facility] care coordination program. Patient reports that he is doing well and denies any  needs today.     SDOH assessments and interventions completed:  No     Care Coordination Interventions:  No, not indicated   Follow up plan: No further intervention required.   Encounter Outcome:  No Answer   Tomasa Rand, RN, BSN, CEN [place] Coordinator [phone]

## 2022-10-28 DIAGNOSIS — K5900 Constipation, unspecified: Secondary | ICD-10-CM | POA: Diagnosis not present

## 2022-10-28 DIAGNOSIS — M79605 Pain in left leg: Secondary | ICD-10-CM | POA: Diagnosis not present

## 2022-10-28 DIAGNOSIS — Z6820 Body mass index (BMI) 20.0-20.9, adult: Secondary | ICD-10-CM | POA: Diagnosis not present

## 2022-10-28 DIAGNOSIS — R634 Abnormal weight loss: Secondary | ICD-10-CM | POA: Diagnosis not present

## 2022-10-28 DIAGNOSIS — M79604 Pain in right leg: Secondary | ICD-10-CM | POA: Diagnosis not present

## 2022-11-15 DIAGNOSIS — Z743 Need for continuous supervision: Secondary | ICD-10-CM | POA: Diagnosis not present

## 2022-11-15 DIAGNOSIS — R079 Chest pain, unspecified: Secondary | ICD-10-CM | POA: Diagnosis not present

## 2022-11-15 DIAGNOSIS — R0789 Other chest pain: Secondary | ICD-10-CM | POA: Diagnosis not present

## 2022-11-16 DIAGNOSIS — Z7984 Long term (current) use of oral hypoglycemic drugs: Secondary | ICD-10-CM | POA: Diagnosis not present

## 2022-11-16 DIAGNOSIS — Z951 Presence of aortocoronary bypass graft: Secondary | ICD-10-CM | POA: Diagnosis not present

## 2022-11-16 DIAGNOSIS — E119 Type 2 diabetes mellitus without complications: Secondary | ICD-10-CM | POA: Diagnosis not present

## 2022-11-16 DIAGNOSIS — I25118 Atherosclerotic heart disease of native coronary artery with other forms of angina pectoris: Secondary | ICD-10-CM | POA: Diagnosis not present

## 2022-11-16 DIAGNOSIS — K449 Diaphragmatic hernia without obstruction or gangrene: Secondary | ICD-10-CM | POA: Diagnosis not present

## 2022-11-16 DIAGNOSIS — I10 Essential (primary) hypertension: Secondary | ICD-10-CM | POA: Diagnosis not present

## 2022-11-16 DIAGNOSIS — Z7901 Long term (current) use of anticoagulants: Secondary | ICD-10-CM | POA: Diagnosis not present

## 2022-11-16 DIAGNOSIS — Z79899 Other long term (current) drug therapy: Secondary | ICD-10-CM | POA: Diagnosis not present

## 2022-11-16 DIAGNOSIS — N281 Cyst of kidney, acquired: Secondary | ICD-10-CM | POA: Diagnosis not present

## 2022-11-16 DIAGNOSIS — I700 Atherosclerosis of aorta: Secondary | ICD-10-CM | POA: Diagnosis not present

## 2022-11-16 DIAGNOSIS — Z20822 Contact with and (suspected) exposure to covid-19: Secondary | ICD-10-CM | POA: Diagnosis not present

## 2022-11-16 DIAGNOSIS — R072 Precordial pain: Secondary | ICD-10-CM | POA: Diagnosis not present

## 2022-11-16 DIAGNOSIS — I2510 Atherosclerotic heart disease of native coronary artery without angina pectoris: Secondary | ICD-10-CM | POA: Diagnosis not present

## 2022-11-16 DIAGNOSIS — J439 Emphysema, unspecified: Secondary | ICD-10-CM | POA: Diagnosis not present

## 2022-11-16 DIAGNOSIS — Z7902 Long term (current) use of antithrombotics/antiplatelets: Secondary | ICD-10-CM | POA: Diagnosis not present

## 2022-11-16 DIAGNOSIS — E785 Hyperlipidemia, unspecified: Secondary | ICD-10-CM | POA: Diagnosis not present

## 2022-11-16 DIAGNOSIS — R079 Chest pain, unspecified: Secondary | ICD-10-CM | POA: Diagnosis not present

## 2022-11-16 DIAGNOSIS — R9431 Abnormal electrocardiogram [ECG] [EKG]: Secondary | ICD-10-CM | POA: Diagnosis not present

## 2022-11-23 ENCOUNTER — Encounter: Payer: Self-pay | Admitting: Cardiology

## 2022-11-23 ENCOUNTER — Ambulatory Visit: Payer: Medicare Other | Attending: Cardiology | Admitting: Cardiology

## 2022-11-23 VITALS — BP 114/68 | HR 67 | Ht 70.0 in | Wt 139.0 lb

## 2022-11-23 DIAGNOSIS — I6522 Occlusion and stenosis of left carotid artery: Secondary | ICD-10-CM

## 2022-11-23 DIAGNOSIS — Z23 Encounter for immunization: Secondary | ICD-10-CM | POA: Diagnosis not present

## 2022-11-23 DIAGNOSIS — Z951 Presence of aortocoronary bypass graft: Secondary | ICD-10-CM

## 2022-11-23 DIAGNOSIS — E785 Hyperlipidemia, unspecified: Secondary | ICD-10-CM | POA: Diagnosis not present

## 2022-11-23 MED ORDER — NITROGLYCERIN 0.4 MG SL SUBL: 0.4000 mg | SUBLINGUAL_TABLET | SUBLINGUAL | 11 refills | Status: AC | PRN

## 2022-11-23 NOTE — Progress Notes (Signed)
Cardiology Office Note:    Date:  11/23/2022   ID:  Joseph Mcmillan, DOB May 16, 1934, MRN [id number]  PCP:  Mateo Flow, MD  Cardiologist:  Jenne Campus, MD    Referring MD: Mateo Flow, MD   Chief Complaint  Patient presents with   Chest Pain    History of Present Illness:    Joseph Mcmillan is a 89 y.o. male with past medical history significant for coronary artery disease.  In 2018 he got carotic endarterectomy done and then in September 2022 he ended up having coronary artery bypass graft done with LIMA and SVG graft x 5.  Additional problem include essential hypertension, dyslipidemia.  Recently he ended up going to the hospital because he went to lay down in the bed at night still having severe chest tightness.  Went to hospital rule out for myocardial infarction a stress test was negative echocardiogram was normal he was discharged home with prescription for Imdur as well as nitroglycerin as needed.  Also dose of Lipitor has been increased to 40 mg daily.  Comes today to months for follow-up.  Overall doing very well.  Denies have any chest pain tightness squeezing pressure burning chest no palpitations no dizziness.  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  Past Medical History:  Diagnosis Date   Aortic calcification    Seen on CT scan   Blepharitis of upper and lower eyelids of both eyes 11/29/2017   CAD (coronary artery disease)    Coronary artery calcification noted on CT scan   Carotid artery stenosis, symptomatic, left - s/p CEA 07/26/2017   Cataract, right eye 1994   Extracted with lens implant   Deprivation amblyopia 08/24/2011   Dermatochalasis 08/24/2011   Essential hypertension 07/26/2017   Fatigue due to excessive exertion 07/26/2017   History of kidney stones    History of left-sided carotid endarterectomy 07/26/2017   History of TIAs 2011   Status post left carotid endarterectomy   Hyperlipidemia    Hyperlipidemia with target LDL less than 70  07/26/2017   Hypertension    Keratoconjunctivitis sicca of both eyes not specified as Sjogren's 11/29/2017   Left-sided carotid artery disease 02/2010   Left carotid endarterectomy   Malnutrition of moderate degree 04/10/2018   Mature cataract 08/24/2011   Occlusion and stenosis of carotid artery without mention of cerebral infarction 10/30/2012   Paroxysmal atrial flutter 05/19/2018   Pernicious anemia    Posterior capsular opacification of right eye, not obscuring vision 08/24/2011   Posterior vitreous detachment 08/24/2011   Presbyopia of both eyes 03/01/2017   Pseudophakia 08/24/2011   S/P CABG x 5 04/04/2018   Weakness of both lower extremities 07/26/2017    Past Surgical History:  Procedure Laterality Date   CARDIAC CATHETERIZATION  03/30/2018   CAROTID ARTERY ANGIOPLASTY Left    CAROTID ENDARTERECTOMY  03-06-2010   Left CEA   CATARACT EXTRACTION W/ INTRAOCULAR LENS IMPLANT Right    CHOLECYSTECTOMY     COLONOSCOPY  04/17/2014   Moderate sigmoid diverticulosis. Otherwise, normal colonoscopy   CORONARY ARTERY BYPASS GRAFT N/A 04/04/2018   Procedure: CORONARY ARTERY BYPASS GRAFTING (CABG)TIMES 5, USING LEFT  INTERNAL MAMMARY ARTERY AND ENDOSCOPICALLY HARVESTED RIGHT AND LEFT SAPHENOUS VEIN;  Surgeon: Gaye Pollack, MD;  Location: [place];  Service: Open Heart Surgery;  Laterality: N/A;   ENDOVEIN HARVEST OF GREATER SAPHENOUS VEIN Right 04/04/2018   Procedure: ENDOVEIN HARVEST OF GREATER SAPHENOUS VEIN;  Surgeon: Gaye Pollack,  MD;  [facility];  Service: Open Heart Surgery;  Laterality: Right;   LEFT HEART CATH AND CORONARY ANGIOGRAPHY N/A 03/30/2018   Procedure: LEFT HEART CATH AND CORONARY ANGIOGRAPHY;  Surgeon: Jettie Booze, MD;  Location: [facility];  Service: Cardiovascular;  Laterality: N/A;   MELANOMA EXCISION     TEE WITHOUT CARDIOVERSION N/A 04/04/2018   Procedure: TRANSESOPHAGEAL ECHOCARDIOGRAM (TEE);  Surgeon: Gaye Pollack, MD;  Location: [facility];   Service: Open Heart Surgery;  Laterality: N/A;    Current Medications: Current Meds  Medication Sig   atorvastatin (LIPITOR) 10 MG tablet Take 1 tablet (10 mg total) by mouth daily.   ELIQUIS 5 MG TABS tablet Take 1 tablet (5 mg total) by mouth 2 (two) times daily.   isosorbide dinitrate (ISORDIL) 30 MG tablet Take 30 mg by mouth daily at 6 (six) AM.   metFORMIN (GLUCOPHAGE-XR) 750 MG 24 hr tablet Take 750 mg by mouth daily.   metoprolol tartrate (LOPRESSOR) 25 MG tablet Take 25 mg by mouth 2 (two) times daily.   nitroGLYCERIN (NITROSTAT) 0.4 MG SL tablet Place 1 tablet under the tongue as needed for chest pain.     Allergies:   Codeine   Social History   Socioeconomic History   Marital status: Married    Spouse name: Not on file   Number of children: Not on file   Years of education: Not on file   Highest education level: Not on file  Occupational History   Occupation: Retired  Tobacco Use   Smoking status: Never   Smokeless tobacco: Never  Vaping Use   Vaping Use: Never used  Substance and Sexual Activity   Alcohol use: No   Drug use: No   Sexual activity: Not on file  Other Topics Concern   Not on file  Social History Narrative   Not on file   Social Determinants of Health   Financial Resource Strain: Low Risk  (03/30/2018)   Overall Financial Resource Strain (CARDIA)    Difficulty of Paying Living Expenses: Not hard at all  Food Insecurity: No Food Insecurity (03/30/2018)   Hunger Vital Sign    Worried About Running Out of Food in the Last Year: Never true    [place] in the Last Year: Never true  Transportation Needs: No Transportation Needs (03/30/2018)   PRAPARE - Hydrologist (Medical): No    Lack of Transportation (Non-Medical): No  Physical Activity: Inactive (03/30/2018)   Exercise Vital Sign    Days of Exercise per Week: 0 days    Minutes of Exercise per Session: 0 min  Stress: No Stress Concern Present (03/30/2018)    [place]    Feeling of Stress : Only a little  Social Connections: Moderately Integrated (03/30/2018)   Social Connection and Isolation Panel [NHANES]    Frequency of Communication with Friends and Family: Three times a week    Frequency of Social Gatherings with Friends and Family: Three times a week    Attends Religious Services: More than 4 times per year    Active Member of Clubs or Organizations: No    Attends Archivist Meetings: Never    Marital Status: Married     Family History: The patient's family history includes Cancer in his mother; Stroke in his father. ROS:   Please see the history of present illness.    All 14 point  review of systems negative except as described per history of present illness  EKGs/Labs/Other Studies Reviewed:      Recent Labs: 09/03/2022: ALT 12; BUN 12; Creatinine, Ser 1.13; Hemoglobin 13.8; Platelets 259; Potassium 4.7; Sodium 140; TSH 3.360  Recent Lipid Panel    Component Value Date/Time   CHOL 161 09/03/2022 0856   TRIG 141 09/03/2022 0856   HDL 60 09/03/2022 0856   CHOLHDL 2.7 09/03/2022 0856   CHOLHDL 2.6 03/31/2018 0846   VLDL 19 03/31/2018 0846   LDLCALC 77 09/03/2022 0856    Physical Exam:    VS:  BP 114/68 (BP Location: Left Arm, Patient Position: Sitting)   Pulse 67   Ht 5\' 10"  (1.778 m)   Wt 139 lb (63 kg)   SpO2 98%   BMI 19.94 kg/m     Wt Readings from Last 3 Encounters:  11/23/22 139 lb (63 kg)  08/26/22 144 lb 9.6 oz (65.6 kg)  01/05/22 145 lb 9.6 oz (66 kg)     GEN:  Well nourished, well developed in no acute distress HEENT: Normal NECK: No JVD; No carotid bruits LYMPHATICS: No lymphadenopathy CARDIAC: RRR, no murmurs, no rubs, no gallops RESPIRATORY:  Clear to auscultation without rales, wheezing or rhonchi  ABDOMEN: Soft, non-tender, non-distended MUSCULOSKELETAL:  No edema; No deformity  SKIN: Warm and dry LOWER EXTREMITIES: no  swelling NEUROLOGIC:  Alert and oriented x 3 PSYCHIATRIC:  Normal affect   ASSESSMENT:    1. S/P CABG x 5   2. Hyperlipidemia with target LDL less than 70   3. Carotid artery stenosis, symptomatic, left - s/p CEA    PLAN:    In order of problems listed above:  Status post coronary artery bypass graft.  I did review record from the hospital.  No evidence of ischemia on the stress test rule out for myocardial infarction with cardiac enzymes and troponins. Dyslipidemia recently his fasting lipid profile showed elevated LDL I did review test from January LDL was 77 dose of Lipitor was increased to 40, will check fasting profile within the next 6 weeks. Carotic artery stenosis.  Will schedule him to have carotic ultrasound. We did talk about healthy lifestyle need to exercise on the regular basis which she is already doing.   Medication Adjustments/Labs and Tests Ordered: Current medicines are reviewed at length with the patient today.  Concerns regarding medicines are outlined above.  No orders of the defined types were placed in this encounter.  Medication changes: No orders of the defined types were placed in this encounter.   Signed, Park Liter, MD, [facility] 11/23/2022 1:34 PM    [facility]

## 2022-11-23 NOTE — Patient Instructions (Signed)
Medication Instructions:   Nitroglycerin Use nitroglycerin 1 tablet placed under the tongue at the first sign of chest pain or an angina attack. 1 tablet may be used every 5 minutes as needed, for up to 15 minutes. Do not take more than 3 tablets in 15 minutes. If pain persist call 911 or go to the nearest ED.     Lab Work: Your physician recommends that you return for lab work in: 6 weeks You need to have labs done when you are fasting.  You can come Monday through Friday 8:30 am to 12:00 pm and 1:15 to 4:30. You do not need to make an appointment as the order has already been placed. The labs you are going to have done are Lipids, AST, ALT .    Testing/Procedures: Your physician has requested that you have a carotid duplex. This test is an ultrasound of the carotid arteries in your neck. It looks at blood flow through these arteries that supply the brain with blood. Allow one hour for this exam. There are no restrictions or special instructions.    Follow-Up: At [facility], you and your health needs are our priority.  As part of our continuing mission to provide you with exceptional heart care, we have created designated Provider Care Teams.  These Care Teams include your primary Cardiologist (physician) and Advanced Practice Providers (APPs -  Physician Assistants and Nurse Practitioners) who all work together to provide you with the care you need, when you need it.  We recommend signing up for the patient portal called "MyChart".  Sign up information is provided on this After Visit Summary.  MyChart is used to connect with patients for Virtual Visits (Telemedicine).  Patients are able to view lab/test results, encounter notes, upcoming appointments, etc.  Non-urgent messages can be sent to your provider as well.   To learn more about what you can do with MyChart, go to [email or web address].    Your next appointment:   5 month(s)  The format for your next appointment:   In  Person  Provider:   Jenne Campus, MD    Other Instructions NA

## 2022-11-23 NOTE — Addendum Note (Signed)
Addended by: Jacobo Forest D on: 11/23/2022 01:48 PM   Modules accepted: Orders

## 2022-12-07 DIAGNOSIS — Z85820 Personal history of malignant melanoma of skin: Secondary | ICD-10-CM | POA: Diagnosis not present

## 2022-12-07 DIAGNOSIS — L570 Actinic keratosis: Secondary | ICD-10-CM | POA: Diagnosis not present

## 2022-12-07 DIAGNOSIS — L578 Other skin changes due to chronic exposure to nonionizing radiation: Secondary | ICD-10-CM | POA: Diagnosis not present

## 2022-12-07 DIAGNOSIS — L821 Other seborrheic keratosis: Secondary | ICD-10-CM | POA: Diagnosis not present

## 2022-12-29 DIAGNOSIS — H02834 Dermatochalasis of left upper eyelid: Secondary | ICD-10-CM | POA: Diagnosis not present

## 2022-12-29 DIAGNOSIS — Z7984 Long term (current) use of oral hypoglycemic drugs: Secondary | ICD-10-CM | POA: Diagnosis not present

## 2022-12-29 DIAGNOSIS — H5201 Hypermetropia, right eye: Secondary | ICD-10-CM | POA: Diagnosis not present

## 2022-12-29 DIAGNOSIS — H53022 Refractive amblyopia, left eye: Secondary | ICD-10-CM | POA: Diagnosis not present

## 2022-12-29 DIAGNOSIS — H0100A Unspecified blepharitis right eye, upper and lower eyelids: Secondary | ICD-10-CM | POA: Diagnosis not present

## 2022-12-29 DIAGNOSIS — H43821 Vitreomacular adhesion, right eye: Secondary | ICD-10-CM | POA: Diagnosis not present

## 2022-12-29 DIAGNOSIS — H52201 Unspecified astigmatism, right eye: Secondary | ICD-10-CM | POA: Diagnosis not present

## 2022-12-29 DIAGNOSIS — H524 Presbyopia: Secondary | ICD-10-CM | POA: Diagnosis not present

## 2022-12-29 DIAGNOSIS — H02831 Dermatochalasis of right upper eyelid: Secondary | ICD-10-CM | POA: Diagnosis not present

## 2022-12-29 DIAGNOSIS — H268 Other specified cataract: Secondary | ICD-10-CM | POA: Diagnosis not present

## 2022-12-29 DIAGNOSIS — H43811 Vitreous degeneration, right eye: Secondary | ICD-10-CM | POA: Diagnosis not present

## 2022-12-29 DIAGNOSIS — H26491 Other secondary cataract, right eye: Secondary | ICD-10-CM | POA: Diagnosis not present

## 2022-12-29 DIAGNOSIS — H16223 Keratoconjunctivitis sicca, not specified as Sjogren's, bilateral: Secondary | ICD-10-CM | POA: Diagnosis not present

## 2022-12-29 DIAGNOSIS — E119 Type 2 diabetes mellitus without complications: Secondary | ICD-10-CM | POA: Diagnosis not present

## 2023-01-04 ENCOUNTER — Ambulatory Visit: Payer: Medicare Other | Attending: Cardiology

## 2023-01-04 DIAGNOSIS — I6522 Occlusion and stenosis of left carotid artery: Secondary | ICD-10-CM | POA: Diagnosis not present

## 2023-03-15 ENCOUNTER — Other Ambulatory Visit: Payer: Self-pay | Admitting: Cardiology

## 2023-03-16 ENCOUNTER — Encounter: Payer: Self-pay | Admitting: Gastroenterology

## 2023-03-21 DIAGNOSIS — R1111 Vomiting without nausea: Secondary | ICD-10-CM | POA: Diagnosis not present

## 2023-03-21 DIAGNOSIS — Z743 Need for continuous supervision: Secondary | ICD-10-CM | POA: Diagnosis not present

## 2023-03-21 DIAGNOSIS — L299 Pruritus, unspecified: Secondary | ICD-10-CM | POA: Diagnosis not present

## 2023-03-21 DIAGNOSIS — R531 Weakness: Secondary | ICD-10-CM | POA: Diagnosis not present

## 2023-03-21 DIAGNOSIS — Z5321 Procedure and treatment not carried out due to patient leaving prior to being seen by health care provider: Secondary | ICD-10-CM | POA: Diagnosis not present

## 2023-03-22 DIAGNOSIS — I25119 Atherosclerotic heart disease of native coronary artery with unspecified angina pectoris: Secondary | ICD-10-CM | POA: Diagnosis not present

## 2023-03-22 DIAGNOSIS — Z9181 History of falling: Secondary | ICD-10-CM | POA: Diagnosis not present

## 2023-03-22 DIAGNOSIS — Z0000 Encounter for general adult medical examination without abnormal findings: Secondary | ICD-10-CM | POA: Diagnosis not present

## 2023-03-29 ENCOUNTER — Other Ambulatory Visit (INDEPENDENT_AMBULATORY_CARE_PROVIDER_SITE_OTHER): Payer: Medicare Other

## 2023-03-29 ENCOUNTER — Encounter: Payer: Self-pay | Admitting: Gastroenterology

## 2023-03-29 ENCOUNTER — Ambulatory Visit: Payer: Medicare Other | Admitting: Gastroenterology

## 2023-03-29 VITALS — BP 122/70 | HR 76 | Ht 70.0 in | Wt 131.0 lb

## 2023-03-29 DIAGNOSIS — R634 Abnormal weight loss: Secondary | ICD-10-CM

## 2023-03-29 DIAGNOSIS — K449 Diaphragmatic hernia without obstruction or gangrene: Secondary | ICD-10-CM

## 2023-03-29 DIAGNOSIS — Z8673 Personal history of transient ischemic attack (TIA), and cerebral infarction without residual deficits: Secondary | ICD-10-CM

## 2023-03-29 DIAGNOSIS — R109 Unspecified abdominal pain: Secondary | ICD-10-CM

## 2023-03-29 DIAGNOSIS — K219 Gastro-esophageal reflux disease without esophagitis: Secondary | ICD-10-CM

## 2023-03-29 DIAGNOSIS — K5909 Other constipation: Secondary | ICD-10-CM

## 2023-03-29 LAB — CBC WITH DIFFERENTIAL/PLATELET
Basophils Absolute: 0 10*3/uL (ref 0.0–0.1)
Basophils Relative: 0.3 % (ref 0.0–3.0)
Eosinophils Absolute: 0 10*3/uL (ref 0.0–0.7)
Eosinophils Relative: 0.3 % (ref 0.0–5.0)
HCT: 39.5 % (ref 39.0–52.0)
Hemoglobin: 13 g/dL (ref 13.0–17.0)
Lymphocytes Relative: 7.1 % — ABNORMAL LOW (ref 12.0–46.0)
Lymphs Abs: 1 10*3/uL (ref 0.7–4.0)
MCHC: 32.9 g/dL (ref 30.0–36.0)
MCV: 93.2 fl (ref 78.0–100.0)
Monocytes Absolute: 0.9 10*3/uL (ref 0.1–1.0)
Monocytes Relative: 6.8 % (ref 3.0–12.0)
Neutro Abs: 11.7 10*3/uL — ABNORMAL HIGH (ref 1.4–7.7)
Neutrophils Relative %: 85.5 % — ABNORMAL HIGH (ref 43.0–77.0)
Platelets: 231 10*3/uL (ref 150.0–400.0)
RBC: 4.24 Mil/uL (ref 4.22–5.81)
RDW: 13.5 % (ref 11.5–15.5)
WBC: 13.7 10*3/uL — ABNORMAL HIGH (ref 4.0–10.5)

## 2023-03-29 LAB — COMPREHENSIVE METABOLIC PANEL
ALT: 15 U/L (ref 0–53)
AST: 17 U/L (ref 0–37)
Albumin: 4.1 g/dL (ref 3.5–5.2)
Alkaline Phosphatase: 95 U/L (ref 39–117)
BUN: 18 mg/dL (ref 6–23)
CO2: 23 mEq/L (ref 19–32)
Calcium: 9.4 mg/dL (ref 8.4–10.5)
Chloride: 98 mEq/L (ref 96–112)
Creatinine, Ser: 0.92 mg/dL (ref 0.40–1.50)
GFR: 75.12 mL/min (ref >60.00)
Glucose, Bld: 139 mg/dL — ABNORMAL HIGH (ref 70–99)
Potassium: 4.4 mEq/L (ref 3.5–5.1)
Sodium: 134 mEq/L — ABNORMAL LOW (ref 135–145)
Total Bilirubin: 0.6 mg/dL (ref 0.2–1.2)
Total Protein: 6.8 g/dL (ref 6.0–8.3)

## 2023-03-29 LAB — TSH: TSH: 1.77 u[IU]/mL (ref 0.35–5.50)

## 2023-03-29 MED ORDER — OMEPRAZOLE 20 MG PO CPDR: 20.0000 mg | DELAYED_RELEASE_CAPSULE | Freq: Every day | ORAL | 3 refills | Status: DC

## 2023-03-29 NOTE — Patient Instructions (Addendum)
_______________________________________________________  If your blood pressure at your visit was 140/90 or greater, please contact your primary care physician to follow up on this.  _______________________________________________________  If you are age 87 or older, your body mass index should be between 23-30. Your Body mass index is 18.8 kg/m. If this is out of the aforementioned range listed, please consider follow up with your Primary Care Provider.  If you are age 63 or younger, your body mass index should be between 19-25. Your Body mass index is 18.8 kg/m. If this is out of the aformentioned range listed, please consider follow up with your Primary Care Provider.   ________________________________________________________  The [facility] providers would like to encourage you to use [facility] to communicate with providers for non-urgent requests or questions.  Due to long hold times on the telephone, sending your provider a message by [facility] may be a faster and more efficient way to get a response.  Please allow 48 business hours for a response.  Please remember that this is for non-urgent requests.  _______________________________________________________  Your provider has requested that you go to the basement level for lab work before leaving today. Press "B" on the elevator. The lab is located at the first door on the left as you exit the elevator.  Please call Dr. Donne Hazel nurse in 2 weeks at [phone] with an update on how you are doing  Increase water intake. Do Smooth Move Tea at night. Please do 2 stool softeners a day   You have been scheduled for an appointment with Dr. Chales Abrahams on 06-21-2023 at 830am . Please arrive 10 minutes early for your appointment.   You have been scheduled for a CT scan of the abdomen and pelvis at [facility] ([address] [place], [place], [place] [address]).   You are scheduled on   04-06-2023  at  1pm . You should arrive 1045am for your  appointment time for registration. Please follow the written instructions below on the day of your exam:  WARNING: IF YOU ARE ALLERGIC TO IODINE/X-RAY DYE, PLEASE NOTIFY RADIOLOGY IMMEDIATELY AT [phone]! YOU WILL BE GIVEN A 13 HOUR PREMEDICATION PREP.  1) Do not eat or drink anything after 9am   (4 hours prior to your test) 2) You have been given 2 bottles of oral contrast to drink. The solution may taste better if refrigerated, but do NOT add ice or any other liquid to this solution. Shake well before drinking.    Drink 1 bottle of contrast @ 11am (2 hours prior to your exam)  Drink 1 bottle of contrast @ 12pm (1 hour prior to your exam)  You may take any medications as prescribed with a small amount of water, if necessary. If you take any of the following medications: METFORMIN, GLUCOPHAGE, GLUCOVANCE, AVANDAMET, RIOMET, FORTAMET, ACTOPLUS MET, JANUMET, GLUMETZA or METAGLIP, you MAY be asked to HOLD this medication 48 hours AFTER the exam.  The purpose of you drinking the oral contrast is to aid in the visualization of your intestinal tract. The contrast solution may cause some diarrhea. Depending on your individual set of symptoms, you may also receive an intravenous injection of x-ray contrast/dye. Plan on being at [facility] for 30 minutes or longer, depending on the type of exam you are having performed.  This test typically takes 30-45 minutes to complete.  If you have any questions regarding your exam or if you need to reschedule, you may call the CT department at [phone] between the hours  of 8:00 am and 5:00 pm, Monday-Friday.  ________________________________________________________________________  Thank you,  Dr. Lynann Bologna

## 2023-03-29 NOTE — Progress Notes (Signed)
Chief Complaint: Constipation  Referring Provider: Dr Luna Kitchens       ASSESSMENT AND PLAN;  Assessment:   Chronic constipation, neg colon 03/2014 except for mod sig diverticulosis. Unexplained Wt loss Abdo pain GERD with small HH H/O TIAs, A flutter vs A fib on eliquis  Plan: CBC, CMP, TSH, celiac screein CT abdo/pelvis with contrast Omeprazole 20mg  po every day 1/2 hr before breakfast #90 Smooth move tea QHS 2 stool sofners/day Call in 2 weeks. If still with problems, miralax, consider EGD (off miralax) FU in 8-12 weeks.  Earlier, if still with problems     HPI:   Joseph Mcmillan is a very pleasant white male with history of long-standing constipation With CAD s/p CABG, CVA/TIA, HTN, HLD, DM2, OSA, H/O melanoma  With lower abdominal pain, abdominal bloating Change in bowel habits in form of increasing constipation despite stool softeners.  However, when he takes MiraLAX or Linzess, he starts having diarrhea.  Other concerning symptom is unexplained weight loss as below.  This is despite good appetite  Has occasional nausea with heartburn Rare vomiting No fever chills or night sweats No jaundice dark urine or pale stools  No melena or hematochezia.   Does not want any invasive endoscopic intervention.    Wt Readings from Last 3 Encounters:  03/29/23 131 lb (59.4 kg)  11/23/22 139 lb (63 kg)  08/26/22 144 lb 9.6 oz (65.6 kg)    Previous GI workup: CTA 10/2022 IMPRESSION: 1. Aortic atherosclerosis with no evidence of aneurysm or dissection. 2. Mild thickening of the walls of the distal esophagus with small hiatal hernia. 3. Atherosclerotic calcification of the renal arteries with moderate-to-severe stenosis of the proximal right renal artery. 4. Multi-vessel coronary artery calcifications. 5. Moderate amount of retained stool in the colon suggesting constipation.   May 2023 US IMPRESSION: Grossly stable small hypoechoic likely cystic lesion near the head of  the pancreas. Recommend continued follow-up with ultrasound in 2 years.   Negative colonoscopy except for sigmoid diverticulosis 2015  Past Medical History:  Diagnosis Date   Aortic calcification (HCC)    Seen on CT scan   Blepharitis of upper and lower eyelids of both eyes 11/29/2017   CAD (coronary artery disease)    Coronary artery calcification noted on CT scan   Carotid artery stenosis, symptomatic, left - s/p CEA 07/26/2017   Cataract, right eye 1994   Extracted with lens implant   Deprivation amblyopia 08/24/2011   Dermatochalasis 08/24/2011   Essential hypertension 07/26/2017   Fatigue due to excessive exertion 07/26/2017   History of kidney stones    History of left-sided carotid endarterectomy 07/26/2017   History of TIAs 2011   Status post left carotid endarterectomy   Hyperlipidemia    Hyperlipidemia with target LDL less than 70 07/26/2017   Hypertension    Keratoconjunctivitis sicca of both eyes not specified as Sjogren's 11/29/2017   Left-sided carotid artery disease (HCC) 02/2010   Left carotid endarterectomy   Malnutrition of moderate degree 04/10/2018   Mature cataract 08/24/2011   Occlusion and stenosis of carotid artery without mention of cerebral infarction 10/30/2012   Paroxysmal atrial flutter (HCC) 05/19/2018   Pernicious anemia    Posterior capsular opacification of right eye, not obscuring vision 08/24/2011   Posterior vitreous detachment 08/24/2011   Presbyopia of both eyes 03/01/2017   Pseudophakia 08/24/2011   S/P CABG x 5 04/04/2018   Weakness of both lower extremities 07/26/2017     Past Surgical History:  Procedure  Laterality Date   CARDIAC CATHETERIZATION  03/30/2018   CAROTID ARTERY ANGIOPLASTY Left    CAROTID ENDARTERECTOMY  03-06-2010   Left CEA   CATARACT EXTRACTION W/ INTRAOCULAR LENS IMPLANT Right    CHOLECYSTECTOMY     COLONOSCOPY  04/17/2014   Moderate sigmoid diverticulosis. Otherwise, normal colonoscopy   CORONARY ARTERY  BYPASS GRAFT N/A 04/04/2018   Procedure: CORONARY ARTERY BYPASS GRAFTING (CABG)TIMES 5, USING LEFT  INTERNAL MAMMARY ARTERY AND ENDOSCOPICALLY HARVESTED RIGHT AND LEFT SAPHENOUS VEIN;  Surgeon: Alleen Borne, MD;  Location: [facility];  Service: Open Heart Surgery;  Laterality: N/A;   ENDOVEIN HARVEST OF GREATER SAPHENOUS VEIN Right 04/04/2018   Procedure: ENDOVEIN HARVEST OF GREATER SAPHENOUS VEIN;  Surgeon: Alleen Borne, MD;  Location[facility];  Service: Open Heart Surgery;  Laterality: Right;   LEFT HEART CATH AND CORONARY ANGIOGRAPHY N/A 03/30/2018   Procedure: LEFT HEART CATH AND CORONARY ANGIOGRAPHY;  Surgeon: Corky Crafts, MD;  Location: [facility] IN[facility];  Service: Cardiovascular;  Laterality: N/A;   MELANOMA EXCISION     TEE WITHOUT CARDIOVERSION N/A 04/04/2018   Procedure: TRANSESOPHAGEAL ECHOCARDIOGRAM (TEE);  Surgeon: Alleen Borne, MD;  Location: [facility] OR;  Service: Open Heart Surgery;  Laterality: N/A;   Family History  Problem Relation Age of Onset   Cancer Mother    Stroke Father    Colon cancer Neg Hx    Esophageal cancer Neg Hx    Stomach cancer Neg Hx    Social History   Tobacco Use   Smoking status: Never   Smokeless tobacco: Never  Vaping Use   Vaping status: Never Used  Substance Use Topics   Alcohol use: No   Drug use: No   Current Outpatient Medications  Medication Sig Dispense Refill   atorvastatin (LIPITOR) 10 MG tablet Take 1 tablet (10 mg total) by mouth daily. 90 tablet 1   ELIQUIS 5 MG TABS tablet Take 1 tablet (5 mg total) by mouth 2 (two) times daily. 60 tablet 5   metFORMIN (GLUCOPHAGE-XR) 750 MG 24 hr tablet Take 750 mg by mouth daily.     isosorbide dinitrate (ISORDIL) 30 MG tablet Take 30 mg by mouth daily at 6 (six) AM. (Patient not taking: Reported on 03/29/2023)     metoprolol tartrate (LOPRESSOR) 25 MG tablet Take 25 mg by mouth 2 (two) times daily. (Patient not taking: Reported on 03/29/2023)     nitroGLYCERIN (NITROSTAT) 0.4 MG SL tablet  Place 1 tablet (0.4 mg total) under the tongue as needed for chest pain. (Patient not taking: Reported on 03/29/2023) 25 tablet 11   No current facility-administered medications for this visit.   Allergies  Allergen Reactions   Codeine Nausea And Vomiting    Review of Systems:  neg     Physical Exam:    BP 122/70   Pulse 76   Ht 5\' 10"  (1.778 m)   Wt 131 lb (59.4 kg)   BMI 18.80 kg/m  Constitutional:  Well-developed, in no acute distress. Psychiatric: Normal mood and affect. Behavior is normal. HEENT: Pupils normal.  Conjunctivae are normal. No scleral icterus. Cardiovascular: Normal rate, regular rhythm. No edema Pulmonary/chest: Effort normal and breath sounds normal. No wheezing, rales or rhonchi. Abdominal: Soft, nondistended. Nontender. Bowel sounds active throughout. There are no masses palpable. No hepatomegaly.  Neurological: Alert and oriented to person place and time. Skin: Skin is warm and dry. No rashes noted.    Latest Ref Rng & Units 09/03/2022  8:56 AM 10/03/2018    4:31 PM 07/10/2018    9:48 AM  CMP  Glucose 70 - 99 mg/dL 161  096    BUN 8 - 27 mg/dL 12  22    Creatinine 0.45 - 1.27 mg/dL 4.09  8.11    Sodium 914 - 144 mmol/L 140  138    Potassium 3.5 - 5.2 mmol/L 4.7  4.1    Chloride 96 - 106 mmol/L 101  98    CO2 20 - 29 mmol/L 27  23    Calcium 8.6 - 10.2 mg/dL 9.4  9.3    Total Protein 6.0 - 8.5 g/dL 6.6   6.2   Total Bilirubin 0.0 - 1.2 mg/dL 0.7   0.4   Alkaline Phos 44 - 121 IU/L 80   104   AST 0 - 40 IU/L 19   17   ALT 0 - 44 IU/L 12   9      Lynann Bologna, MD   Lise Auer, MD

## 2023-04-06 ENCOUNTER — Ambulatory Visit (HOSPITAL_COMMUNITY)
Admission: RE | Admit: 2023-04-06 | Discharge: 2023-04-06 | Disposition: A | Discharge status: Home / Self Care | Payer: Medicare Other | Source: Ambulatory Visit | Attending: Gastroenterology | Admitting: Gastroenterology

## 2023-04-06 ENCOUNTER — Encounter (HOSPITAL_COMMUNITY): Payer: Self-pay

## 2023-04-06 DIAGNOSIS — R634 Abnormal weight loss: Secondary | ICD-10-CM | POA: Insufficient documentation

## 2023-04-06 DIAGNOSIS — K449 Diaphragmatic hernia without obstruction or gangrene: Secondary | ICD-10-CM | POA: Insufficient documentation

## 2023-04-06 DIAGNOSIS — K5730 Diverticulosis of large intestine without perforation or abscess without bleeding: Secondary | ICD-10-CM | POA: Diagnosis not present

## 2023-04-06 DIAGNOSIS — K219 Gastro-esophageal reflux disease without esophagitis: Secondary | ICD-10-CM | POA: Diagnosis not present

## 2023-04-06 DIAGNOSIS — Z8673 Personal history of transient ischemic attack (TIA), and cerebral infarction without residual deficits: Secondary | ICD-10-CM | POA: Insufficient documentation

## 2023-04-06 DIAGNOSIS — R109 Unspecified abdominal pain: Secondary | ICD-10-CM | POA: Diagnosis not present

## 2023-04-06 DIAGNOSIS — K5909 Other constipation: Secondary | ICD-10-CM | POA: Insufficient documentation

## 2023-04-06 DIAGNOSIS — N281 Cyst of kidney, acquired: Secondary | ICD-10-CM | POA: Diagnosis not present

## 2023-04-06 MED ORDER — IOHEXOL 300 MG/ML  SOLN
100.0000 mL | Freq: Once | INTRAMUSCULAR | Status: AC | PRN
  Administered 2023-04-06: 100 mL via INTRAVENOUS

## 2023-04-06 MED ORDER — IOHEXOL 9 MG/ML PO SOLN
ORAL | Status: AC
  Filled 2023-04-06: qty 1000

## 2023-04-06 MED ORDER — IOHEXOL 9 MG/ML PO SOLN
1000.0000 mL | Freq: Once | ORAL | Status: AC
  Administered 2023-04-06: 1000 mL via ORAL

## 2023-04-06 MED ORDER — SODIUM CHLORIDE (PF) 0.9 % IJ SOLN
INTRAMUSCULAR | Status: AC
  Filled 2023-04-06: qty 50

## 2023-05-03 DIAGNOSIS — L570 Actinic keratosis: Secondary | ICD-10-CM | POA: Diagnosis not present

## 2023-05-03 DIAGNOSIS — L821 Other seborrheic keratosis: Secondary | ICD-10-CM | POA: Diagnosis not present

## 2023-05-03 DIAGNOSIS — L578 Other skin changes due to chronic exposure to nonionizing radiation: Secondary | ICD-10-CM | POA: Diagnosis not present

## 2023-05-03 DIAGNOSIS — Z85820 Personal history of malignant melanoma of skin: Secondary | ICD-10-CM | POA: Diagnosis not present

## 2023-05-05 ENCOUNTER — Other Ambulatory Visit: Payer: Self-pay

## 2023-05-05 MED ORDER — APIXABAN 2.5 MG PO TABS: 5.0000 mg | ORAL_TABLET | Freq: Two times a day (BID) | ORAL | 0 refills | Status: DC

## 2023-06-14 DIAGNOSIS — Z23 Encounter for immunization: Secondary | ICD-10-CM | POA: Diagnosis not present

## 2023-06-15 ENCOUNTER — Other Ambulatory Visit: Payer: Self-pay | Admitting: Cardiology

## 2023-06-16 ENCOUNTER — Ambulatory Visit: Payer: Medicare Other | Admitting: Gastroenterology

## 2023-06-21 ENCOUNTER — Ambulatory Visit: Payer: Medicare Other | Admitting: Gastroenterology

## 2023-06-21 ENCOUNTER — Encounter: Payer: Self-pay | Admitting: Gastroenterology

## 2023-06-21 VITALS — BP 140/78 | HR 66 | Ht 71.0 in | Wt 130.6 lb

## 2023-06-21 DIAGNOSIS — R634 Abnormal weight loss: Secondary | ICD-10-CM

## 2023-06-21 DIAGNOSIS — Z8673 Personal history of transient ischemic attack (TIA), and cerebral infarction without residual deficits: Secondary | ICD-10-CM | POA: Diagnosis not present

## 2023-06-21 DIAGNOSIS — K219 Gastro-esophageal reflux disease without esophagitis: Secondary | ICD-10-CM

## 2023-06-21 DIAGNOSIS — R109 Unspecified abdominal pain: Secondary | ICD-10-CM | POA: Diagnosis not present

## 2023-06-21 DIAGNOSIS — K449 Diaphragmatic hernia without obstruction or gangrene: Secondary | ICD-10-CM

## 2023-06-21 DIAGNOSIS — K5909 Other constipation: Secondary | ICD-10-CM | POA: Diagnosis not present

## 2023-06-21 MED ORDER — OMEPRAZOLE 20 MG PO CPDR: 20.0000 mg | DELAYED_RELEASE_CAPSULE | Freq: Every day | ORAL | 3 refills | Status: DC

## 2023-06-21 NOTE — Patient Instructions (Addendum)
We have sent the following medications to your pharmacy for you to pick up at your convenience: Omeprazole   Start taking Dulcolax 2 times a day on Monday, Wednesday, Friday  Start taking Miralax daily   Follow up in 6 month's  If your blood pressure at your visit was 140/90 or greater, please contact your primary care physician to follow up on this.  _______________________________________________________  If you are age 87 or older, your body mass index should be between 23-30. Your Body mass index is 18.22 kg/m. If this is out of the aforementioned range listed, please consider follow up with your Primary Care Provider.  If you are age 71 or younger, your body mass index should be between 19-25. Your Body mass index is 18.22 kg/m. If this is out of the aformentioned range listed, please consider follow up with your Primary Care Provider.   ________________________________________________________  The [facility] providers would like to encourage you to use [facility] to communicate with providers for non-urgent requests or questions.  Due to long hold times on the telephone, sending your provider a message by [facility] may be a faster and more efficient way to get a response.  Please allow 48 business hours for a response.  Please remember that this is for non-urgent requests.  _______________________________________________________  Thank you for entrusting me with your care and choosing [facility].  Dr Chales Abrahams

## 2023-06-21 NOTE — Progress Notes (Signed)
Chief Complaint: Constipation  Referring Provider: Dr Luna Kitchens       ASSESSMENT AND PLAN;  Assessment:   Chronic constipation, neg colon 03/2014 except for mod sig diverticulosis. Unexplained Wt loss (resolved). Neg CT AP 03/2023. Nl CBC, CMP, TSH, celiac Abdo pain likely d/t 1 GERD with small HH H/O TIAs, A flutter vs A fib on eliquis  Plan:  Omeprazole 20mg  po every day 1/2 hr before breakfast #90, 3RF Dulcolax 2/day MWF Miralax 17g po BID, then after good BM, take it QD FU in 6 months. Earlier if any problems.     HPI:   Joseph Mcmillan is a very pleasant white male with history of long-standing constipation With CAD s/p CABG, CVA/TIA, HTN, HLD, DM2, OSA, H/O melanoma  Still with constipation but better No further weight loss  Had CT Abdo/pelvis with contrast 03/2023 which was negative except for moderate colonic stool burden.  He has been using 2 Dulcolax on as needed basis Did not get smooth move as he could not figure out When he took Linzess in the past he started having diarrhea-that would be another choice as needed  No abdominal pain.  No upper GI problems as long as he takes omeprazole. Rare vomiting No fever chills or night sweats No jaundice dark urine or pale stools  No melena or hematochezia.   Does not want any invasive endoscopic intervention.   Wt Readings from Last 3 Encounters:  06/21/23 130 lb 9.6 oz (59.2 kg)  03/29/23 131 lb (59.4 kg)  11/23/22 139 lb (63 kg)    Previous GI workup: CTAP with contrast 04/13/2023 Sigmoid diverticulosis, without evidence of diverticulitis. Mild to moderate colonic stool burden, suggesting mild constipation. Otherwise unremarkable CT abdomen/pelvis.    CTA 10/2022 IMPRESSION: 1. Aortic atherosclerosis with no evidence of aneurysm or dissection. 2. Mild thickening of the walls of the distal esophagus with small hiatal hernia. 3. Atherosclerotic calcification of the renal arteries with moderate-to-severe  stenosis of the proximal right renal artery. 4. Multi-vessel coronary artery calcifications. 5. Moderate amount of retained stool in the colon suggesting constipation.   May 2023 US IMPRESSION: Grossly stable small hypoechoic likely cystic lesion near the head of the pancreas. Recommend continued follow-up with ultrasound in 2 years.   Negative colonoscopy except for sigmoid diverticulosis 2015  Past Medical History:  Diagnosis Date   Aortic calcification (HCC)    Seen on CT scan   Blepharitis of upper and lower eyelids of both eyes 11/29/2017   CAD (coronary artery disease)    Coronary artery calcification noted on CT scan   Carotid artery stenosis, symptomatic, left - s/p CEA 07/26/2017   Cataract, right eye 1994   Extracted with lens implant   Deprivation amblyopia 08/24/2011   Dermatochalasis 08/24/2011   Essential hypertension 07/26/2017   Fatigue due to excessive exertion 07/26/2017   History of kidney stones    History of left-sided carotid endarterectomy 07/26/2017   History of TIAs 2011   Status post left carotid endarterectomy   Hyperlipidemia    Hyperlipidemia with target LDL less than 70 07/26/2017   Hypertension    Keratoconjunctivitis sicca of both eyes not specified as Sjogren's 11/29/2017   Left-sided carotid artery disease (HCC) 02/2010   Left carotid endarterectomy   Malnutrition of moderate degree 04/10/2018   Mature cataract 08/24/2011   Occlusion and stenosis of carotid artery without mention of cerebral infarction 10/30/2012   Paroxysmal atrial flutter (HCC) 05/19/2018   Pernicious anemia    Posterior  capsular opacification of right eye, not obscuring vision 08/24/2011   Posterior vitreous detachment 08/24/2011   Presbyopia of both eyes 03/01/2017   Pseudophakia 08/24/2011   S/P CABG x 5 04/04/2018   Weakness of both lower extremities 07/26/2017     Past Surgical History:  Procedure Laterality Date   CARDIAC CATHETERIZATION  03/30/2018    CAROTID ARTERY ANGIOPLASTY Left    CAROTID ENDARTERECTOMY  03-06-2010   Left CEA   CATARACT EXTRACTION W/ INTRAOCULAR LENS IMPLANT Right    CHOLECYSTECTOMY     COLONOSCOPY  04/17/2014   Moderate sigmoid diverticulosis. Otherwise, normal colonoscopy   CORONARY ARTERY BYPASS GRAFT N/A 04/04/2018   Procedure: CORONARY ARTERY BYPASS GRAFTING (CABG)TIMES 5, USING LEFT  INTERNAL MAMMARY ARTERY AND ENDOSCOPICALLY HARVESTED RIGHT AND LEFT SAPHENOUS VEIN;  Surgeon: Alleen Borne, MD;  Location: [facility] OR;  Service: Open Heart Surgery;  Laterality: N/A;   ENDOVEIN HARVEST OF GREATER SAPHENOUS VEIN Right 04/04/2018   Procedure: ENDOVEIN HARVEST OF GREATER SAPHENOUS VEIN;  Surgeon: Alleen Borne, MD;  Location: [facility] OR;  Service: Open Heart Surgery;  Laterality: Right;   LEFT HEART CATH AND CORONARY ANGIOGRAPHY N/A 03/30/2018   Procedure: LEFT HEART CATH AND CORONARY ANGIOGRAPHY;  Surgeon: Corky Crafts, MD;  Location: [facility] INVASIVE CV LAB;  Service: Cardiovascular;  Laterality: N/A;   MELANOMA EXCISION     TEE WITHOUT CARDIOVERSION N/A 04/04/2018   Procedure: TRANSESOPHAGEAL ECHOCARDIOGRAM (TEE);  Surgeon: Alleen Borne, MD;  Location: [facility] OR;  Service: Open Heart Surgery;  Laterality: N/A;   Family History  Problem Relation Age of Onset   Cancer Mother    Stroke Father    Colon cancer Neg Hx    Esophageal cancer Neg Hx    Stomach cancer Neg Hx    Social History   Tobacco Use   Smoking status: Never   Smokeless tobacco: Never  Vaping Use   Vaping status: Never Used  Substance Use Topics   Alcohol use: No   Drug use: No   Current Outpatient Medications  Medication Sig Dispense Refill   apixaban (ELIQUIS) 2.5 MG TABS tablet Take 2 tablets (5 mg total) by mouth 2 (two) times daily. 28 tablet 0   atorvastatin (LIPITOR) 10 MG tablet Take 1 tablet (10 mg total) by mouth daily. 90 tablet 1   ELIQUIS 5 MG TABS tablet Take 1 tablet (5 mg total) by mouth 2 (two) times daily. 60 tablet 5    metFORMIN (GLUCOPHAGE-XR) 750 MG 24 hr tablet Take 750 mg by mouth daily.     omeprazole (PRILOSEC) 20 MG capsule Take 1 capsule (20 mg total) by mouth daily. Take 30 minutes before breakfast 90 capsule 3   isosorbide dinitrate (ISORDIL) 30 MG tablet Take 30 mg by mouth daily at 6 (six) AM. (Patient not taking: Reported on 03/29/2023)     metoprolol tartrate (LOPRESSOR) 25 MG tablet Take 25 mg by mouth 2 (two) times daily. (Patient not taking: Reported on 03/29/2023)     nitroGLYCERIN (NITROSTAT) 0.4 MG SL tablet Place 1 tablet (0.4 mg total) under the tongue as needed for chest pain. (Patient not taking: Reported on 03/29/2023) 25 tablet 11   No current facility-administered medications for this visit.   Allergies  Allergen Reactions   Codeine Nausea And Vomiting    Review of Systems:  neg     Physical Exam:    BP (!) 140/78   Pulse 66   Ht 5\' 11"  (1.803 m)   Wt 130  lb 9.6 oz (59.2 kg)   SpO2 95%   BMI 18.22 kg/m  Constitutional:  Well-developed, in no acute distress. Psychiatric: Normal mood and affect. Behavior is normal. HEENT: Pupils normal.  Conjunctivae are normal. No scleral icterus. Cardiovascular: Normal rate, regular rhythm. No edema Pulmonary/chest: Effort normal and breath sounds normal. No wheezing, rales or rhonchi. Abdominal: Soft, nondistended. Nontender. Bowel sounds active throughout. There are no masses palpable. No hepatomegaly.  Neurological: Alert and oriented to person place and time. Skin: Skin is warm and dry. No rashes noted.    Latest Ref Rng & Units 03/29/2023   10:16 AM 09/03/2022    8:56 AM 10/03/2018    4:31 PM  CMP  Glucose 70 - 99 mg/dL 528  413  244   BUN 6 - 23 mg/dL 18  12  22    Creatinine 0.40 - 1.50 mg/dL 0.10  2.72  5.36   Sodium 135 - 145 mEq/L 134  140  138   Potassium 3.5 - 5.1 mEq/L 4.4  4.7  4.1   Chloride 96 - 112 mEq/L 98  101  98   CO2 19 - 32 mEq/L 23  27  23    Calcium 8.4 - 10.5 mg/dL 9.4  9.4  9.3   Total Protein 6.0 - 8.3 g/dL  6.8  6.6    Total Bilirubin 0.2 - 1.2 mg/dL 0.6  0.7    Alkaline Phos 39 - 117 U/L 95  80    AST 0 - 37 U/L 17  19    ALT 0 - 53 U/L 15  12       Lynann Bologna, MD   Lise Auer, MD

## 2023-07-12 DIAGNOSIS — F5102 Adjustment insomnia: Secondary | ICD-10-CM | POA: Diagnosis not present

## 2023-07-12 DIAGNOSIS — R634 Abnormal weight loss: Secondary | ICD-10-CM | POA: Diagnosis not present

## 2023-07-12 DIAGNOSIS — Z681 Body mass index (BMI) 19 or less, adult: Secondary | ICD-10-CM | POA: Diagnosis not present

## 2023-08-18 DIAGNOSIS — L570 Actinic keratosis: Secondary | ICD-10-CM | POA: Diagnosis not present

## 2023-08-18 DIAGNOSIS — L578 Other skin changes due to chronic exposure to nonionizing radiation: Secondary | ICD-10-CM | POA: Diagnosis not present

## 2023-08-18 DIAGNOSIS — L720 Epidermal cyst: Secondary | ICD-10-CM | POA: Diagnosis not present

## 2023-08-31 ENCOUNTER — Other Ambulatory Visit: Payer: Self-pay

## 2023-08-31 MED ORDER — APIXABAN 5 MG PO TABS: 5.0000 mg | ORAL_TABLET | Freq: Two times a day (BID) | ORAL | Status: DC

## 2023-09-15 ENCOUNTER — Telehealth: Payer: Self-pay | Admitting: Cardiology

## 2023-09-15 ENCOUNTER — Other Ambulatory Visit: Payer: Self-pay

## 2023-09-15 MED ORDER — APIXABAN 5 MG PO TABS: 5.0000 mg | ORAL_TABLET | Freq: Two times a day (BID) | ORAL | Status: DC

## 2023-09-15 NOTE — Telephone Encounter (Signed)
Patient calling the office for samples of medication:   1.  What medication and dosage are you requesting samples for? apixaban (ELIQUIS) 5 MG TABS tablet   2.  Are you currently out of this medication? Yes   Need samples to hold over until refill in February

## 2023-09-15 NOTE — Telephone Encounter (Signed)
Called patient and informed him that I had set aside some samples Eliquis for him to pick at his convience. Patient verbalized understanding and stated that he would come by the office on Friday and pick - up the samples. Patient had no further questions at this time.

## 2023-10-20 DIAGNOSIS — R300 Dysuria: Secondary | ICD-10-CM | POA: Diagnosis not present

## 2023-11-01 DIAGNOSIS — L570 Actinic keratosis: Secondary | ICD-10-CM | POA: Diagnosis not present

## 2023-11-01 DIAGNOSIS — L578 Other skin changes due to chronic exposure to nonionizing radiation: Secondary | ICD-10-CM | POA: Diagnosis not present

## 2023-11-01 DIAGNOSIS — L821 Other seborrheic keratosis: Secondary | ICD-10-CM | POA: Diagnosis not present

## 2023-11-01 DIAGNOSIS — C44319 Basal cell carcinoma of skin of other parts of face: Secondary | ICD-10-CM | POA: Diagnosis not present

## 2023-11-01 DIAGNOSIS — B079 Viral wart, unspecified: Secondary | ICD-10-CM | POA: Diagnosis not present

## 2023-11-09 ENCOUNTER — Other Ambulatory Visit: Payer: Self-pay | Admitting: *Deleted

## 2023-11-09 DIAGNOSIS — I4892 Unspecified atrial flutter: Secondary | ICD-10-CM

## 2023-11-09 MED ORDER — APIXABAN 2.5 MG PO TABS
2.5000 mg | ORAL_TABLET | Freq: Two times a day (BID) | ORAL | 0 refills | Status: DC
Start: 2023-11-09 — End: 2024-03-20

## 2023-11-09 NOTE — Telephone Encounter (Signed)
 Per Laural Golden, PharmD, dose should be decreased to 2.5mg  BID.   Called pt to make him aware of dose change, no answer. Left message on voicemail.  Refill sent.

## 2023-11-09 NOTE — Telephone Encounter (Signed)
 Prescription refill request for Eliquis received. Indication: Afib/Aflutter  Last office visit: 11/23/22 Bing Matter)  Scr: 0.92 (03/29/23)  Age: 89 Weight: 59.2kg  Per dosing criteria, current dose not appropriate. Will forward to PharmD team to review.

## 2023-11-10 DIAGNOSIS — L84 Corns and callosities: Secondary | ICD-10-CM | POA: Diagnosis not present

## 2023-11-10 DIAGNOSIS — Z6820 Body mass index (BMI) 20.0-20.9, adult: Secondary | ICD-10-CM | POA: Diagnosis not present

## 2023-11-10 DIAGNOSIS — R413 Other amnesia: Secondary | ICD-10-CM | POA: Diagnosis not present

## 2023-11-10 DIAGNOSIS — M722 Plantar fascial fibromatosis: Secondary | ICD-10-CM | POA: Diagnosis not present

## 2023-11-15 DIAGNOSIS — C44319 Basal cell carcinoma of skin of other parts of face: Secondary | ICD-10-CM | POA: Diagnosis not present

## 2023-11-17 ENCOUNTER — Encounter: Payer: Self-pay | Admitting: Cardiology

## 2023-11-17 ENCOUNTER — Ambulatory Visit: Attending: Cardiology | Admitting: Cardiology

## 2023-11-17 VITALS — BP 138/62 | HR 80 | Ht 70.0 in | Wt 135.4 lb

## 2023-11-17 DIAGNOSIS — I10 Essential (primary) hypertension: Secondary | ICD-10-CM | POA: Diagnosis not present

## 2023-11-17 DIAGNOSIS — I4892 Unspecified atrial flutter: Secondary | ICD-10-CM | POA: Diagnosis not present

## 2023-11-17 DIAGNOSIS — R0609 Other forms of dyspnea: Secondary | ICD-10-CM

## 2023-11-17 DIAGNOSIS — Z951 Presence of aortocoronary bypass graft: Secondary | ICD-10-CM | POA: Diagnosis not present

## 2023-11-17 DIAGNOSIS — I6522 Occlusion and stenosis of left carotid artery: Secondary | ICD-10-CM | POA: Diagnosis not present

## 2023-11-17 DIAGNOSIS — E785 Hyperlipidemia, unspecified: Secondary | ICD-10-CM | POA: Diagnosis not present

## 2023-11-17 NOTE — Addendum Note (Signed)
 Addended by: Baldo Ash D on: 11/17/2023 03:26 PM   Modules accepted: Orders

## 2023-11-17 NOTE — Patient Instructions (Addendum)
 Medication Instructions:  Your physician recommends that you continue on your current medications as directed. Please refer to the Current Medication list given to you today.  *If you need a refill on your cardiac medications before your next appointment, please call your pharmacy*   Lab Work: Your physician recommends that you return for lab work in: when fasting You need to have labs done when you are fasting.  You can come Monday through Friday 8:30 am to 12:00 pm and 1:15 to 4:30. You do not need to make an appointment as the order has already been placed. The labs you are going to have done are Lipids.    Testing/Procedures: Your physician has requested that you have an echocardiogram. Echocardiography is a painless test that uses sound waves to create images of your heart. It provides your doctor with information about the size and shape of your heart and how well your heart's chambers and valves are working. This procedure takes approximately one hour. There are no restrictions for this procedure. Please do NOT wear cologne, perfume, aftershave, or lotions (deodorant is allowed). Please arrive 15 minutes prior to your appointment time.  Please note: We ask at that you not bring children with you during ultrasound (echo/ vascular) testing. Due to room size and safety concerns, children are not allowed in the ultrasound rooms during exams. Our front office staff cannot provide observation of children in our lobby area while testing is being conducted. An adult accompanying a patient to their appointment will only be allowed in the ultrasound room at the discretion of the ultrasound technician under special circumstances. We apologize for any inconvenience.    Follow-Up: At [facility], you and your health needs are our priority.  As part of our continuing mission to provide you with exceptional heart care, we have created designated Provider Care Teams.  These Care Teams include your  primary Cardiologist (physician) and Advanced Practice Providers (APPs -  Physician Assistants and Nurse Practitioners) who all work together to provide you with the care you need, when you need it.  We recommend signing up for the patient portal called "MyChart".  Sign up information is provided on this After Visit Summary.  MyChart is used to connect with patients for Virtual Visits (Telemedicine).  Patients are able to view lab/test results, encounter notes, upcoming appointments, etc.  Non-urgent messages can be sent to your provider as well.   To learn more about what you can do with MyChart, go to [email or web address].    Your next appointment:   6 month(s)  The format for your next appointment:   In Person  Provider:   Gypsy Balsam, MD    Other Instructions NA

## 2023-11-17 NOTE — Progress Notes (Signed)
 Cardiology Office Note:    Date:  11/17/2023   ID:  ZYLER HYSON, DOB 1933/12/28, MRN [id number]  PCP:  Lise Auer, MD  Cardiologist:  Gypsy Balsam, MD    Referring MD: Lise Auer, MD   Chief Complaint  Patient presents with   Follow-up    History of Present Illness:    Joseph Mcmillan is a 89 y.o. male  with past medical history significant for coronary artery disease. In 2018 he got carotic endarterectomy done and then in September 2022 he ended up having coronary artery bypass graft done with LIMA and SVG graft x 5. Additional problem include essential hypertension, dyslipidemia. Recently he ended up going to the hospital because he went to lay down in the bed at night still having severe chest tightness. Went to hospital rule out for myocardial infarction a stress test was negative echocardiogram was normal he was discharged home with prescription for Imdur as well as nitroglycerin as needed. Also dose of Lipitor has been increased to 40 mg daily.  Comes today to my office for follow-up cardiac wise doing well.  He did have some surgery done by dermatologist of his forehead there is some suture wounds on his forehead and some swollen eye.  But no evidence of active infection.  No chest pain tightness squeezing pressure burning chest.  He described to have some skipped beats sometimes  Past Medical History:  Diagnosis Date   Aortic calcification (HCC)    Seen on CT scan   Blepharitis of upper and lower eyelids of both eyes 11/29/2017   CAD (coronary artery disease)    Coronary artery calcification noted on CT scan   Carotid artery stenosis, symptomatic, left - s/p CEA 07/26/2017   Cataract, right eye 1994   Extracted with lens implant   Deprivation amblyopia 08/24/2011   Dermatochalasis 08/24/2011   Essential hypertension 07/26/2017   Fatigue due to excessive exertion 07/26/2017   History of kidney stones    History of left-sided carotid endarterectomy 07/26/2017    History of TIAs 2011   Status post left carotid endarterectomy   Hyperlipidemia    Hyperlipidemia with target LDL less than 70 07/26/2017   Hypertension    Keratoconjunctivitis sicca of both eyes not specified as Sjogren's 11/29/2017   Left-sided carotid artery disease (HCC) 02/2010   Left carotid endarterectomy   Malnutrition of moderate degree 04/10/2018   Mature cataract 08/24/2011   Occlusion and stenosis of carotid artery without mention of cerebral infarction 10/30/2012   Paroxysmal atrial flutter (HCC) 05/19/2018   Pernicious anemia    Posterior capsular opacification of right eye, not obscuring vision 08/24/2011   Posterior vitreous detachment 08/24/2011   Presbyopia of both eyes 03/01/2017   Pseudophakia 08/24/2011   S/P CABG x 5 04/04/2018   Weakness of both lower extremities 07/26/2017    Past Surgical History:  Procedure Laterality Date   CARDIAC CATHETERIZATION  03/30/2018   CAROTID ARTERY ANGIOPLASTY Left    CAROTID ENDARTERECTOMY  03-06-2010   Left CEA   CATARACT EXTRACTION W/ INTRAOCULAR LENS IMPLANT Right    CHOLECYSTECTOMY     COLONOSCOPY  04/17/2014   Moderate sigmoid diverticulosis. Otherwise, normal colonoscopy   CORONARY ARTERY BYPASS GRAFT N/A 04/04/2018   Procedure: CORONARY ARTERY BYPASS GRAFTING (CABG)TIMES 5, USING LEFT  INTERNAL MAMMARY ARTERY AND ENDOSCOPICALLY HARVESTED RIGHT AND LEFT SAPHENOUS VEIN;  Surgeon: Alleen Borne, MD;  Location: [facility] OR;  Service: Open Heart Surgery;  Laterality: N/A;   ENDOVEIN  HARVEST OF GREATER SAPHENOUS VEIN Right 04/04/2018   Procedure: ENDOVEIN HARVEST OF GREATER SAPHENOUS VEIN;  Surgeon: Alleen Borne, MD;  Location: [facility] OR;  Service: Open Heart Surgery;  Laterality: Right;   LEFT HEART CATH AND CORONARY ANGIOGRAPHY N/A 03/30/2018   Procedure: LEFT HEART CATH AND CORONARY ANGIOGRAPHY;  Surgeon: Corky Crafts, MD;  Location: [facility] INVASIVE CV LAB;  Service: Cardiovascular;  Laterality: N/A;   MELANOMA EXCISION      TEE WITHOUT CARDIOVERSION N/A 04/04/2018   Procedure: TRANSESOPHAGEAL ECHOCARDIOGRAM (TEE);  Surgeon: Alleen Borne, MD;  Location: [facility] OR;  Service: Open Heart Surgery;  Laterality: N/A;    Current Medications: Current Meds  Medication Sig   apixaban (ELIQUIS) 2.5 MG TABS tablet Take 1 tablet (2.5 mg total) by mouth 2 (two) times daily.   atorvastatin (LIPITOR) 10 MG tablet Take 1 tablet (10 mg total) by mouth daily.   isosorbide dinitrate (ISORDIL) 30 MG tablet Take 30 mg by mouth daily at 6 (six) AM.   metFORMIN (GLUCOPHAGE-XR) 750 MG 24 hr tablet Take 750 mg by mouth daily.   metoprolol tartrate (LOPRESSOR) 25 MG tablet Take 25 mg by mouth 2 (two) times daily.   nitroGLYCERIN (NITROSTAT) 0.4 MG SL tablet Place 1 tablet (0.4 mg total) under the tongue as needed for chest pain.   omeprazole (PRILOSEC) 20 MG capsule Take 1 capsule (20 mg total) by mouth daily. Take 30 minutes before breakfast   tamsulosin (FLOMAX) 0.4 MG CAPS capsule Take 0.4 mg by mouth at bedtime.     Allergies:   Codeine   Social History   Socioeconomic History   Marital status: Married    Spouse name: Not on file   Number of children: 2   Years of education: Not on file   Highest education level: Not on file  Occupational History   Occupation: Retired   Occupation: retired  Tobacco Use   Smoking status: Never   Smokeless tobacco: Never  Vaping Use   Vaping status: Never Used  Substance and Sexual Activity   Alcohol use: No   Drug use: No   Sexual activity: Not on file  Other Topics Concern   Not on file  Social History Narrative   Not on file   Social Drivers of Health   Financial Resource Strain: Low Risk  (03/30/2018)   Overall Financial Resource Strain (CARDIA)    Difficulty of Paying Living Expenses: Not hard at all  Food Insecurity: No Food Insecurity (03/30/2018)   Hunger Vital Sign    Worried About Running Out of Food in the Last Year: Never true    Ran Out of Food in the Last Year: Never  true  Transportation Needs: No Transportation Needs (03/30/2018)   PRAPARE - Administrator, Civil Service (Medical): No    Lack of Transportation (Non-Medical): No  Physical Activity: Inactive (03/30/2018)   Exercise Vital Sign    Days of Exercise per Week: 0 days    Minutes of Exercise per Session: 0 min  Stress: No Stress Concern Present (03/30/2018)   [employer] of Occupational Health - Occupational Stress Questionnaire    Feeling of Stress : Only a little  Social Connections: Moderately Integrated (03/30/2018)   Social Connection and Isolation Panel [NHANES]    Frequency of Communication with Friends and Family: Three times a week    Frequency of Social Gatherings with Friends and Family: Three times a week    Attends Religious Services: More than 4  times per year    Active Member of Clubs or Organizations: No    Attends Banker Meetings: Never    Marital Status: Married     Family History: The patient's family history includes Cancer in his mother; Stroke in his father. There is no history of Colon cancer, Esophageal cancer, or Stomach cancer. ROS:   Please see the history of present illness.    All 14 point review of systems negative except as described per history of present illness  EKGs/Labs/Other Studies Reviewed:         Recent Labs: 03/29/2023: ALT 15; BUN 18; Creatinine, Ser 0.92; Hemoglobin 13.0; Platelets 231.0; Potassium 4.4; Sodium 134; TSH 1.77  Recent Lipid Panel    Component Value Date/Time   CHOL 161 09/03/2022 0856   TRIG 141 09/03/2022 0856   HDL 60 09/03/2022 0856   CHOLHDL 2.7 09/03/2022 0856   CHOLHDL 2.6 03/31/2018 0846   VLDL 19 03/31/2018 0846   LDLCALC 77 09/03/2022 0856    Physical Exam:    VS:  BP 138/62 (BP Location: Left Arm, Patient Position: Sitting)   Pulse 80   Ht 5\' 10"  (1.778 m)   Wt 135 lb 6.4 oz (61.4 kg)   SpO2 97%   BMI 19.43 kg/m     Wt Readings from Last 3 Encounters:  11/17/23 135 lb 6.4 oz  (61.4 kg)  06/21/23 130 lb 9.6 oz (59.2 kg)  03/29/23 131 lb (59.4 kg)     GEN:  Well nourished, well developed in no acute distress HEENT: Normal NECK: No JVD; No carotid bruits LYMPHATICS: No lymphadenopathy CARDIAC: RRR, no murmurs, no rubs, no gallops RESPIRATORY:  Clear to auscultation without rales, wheezing or rhonchi  ABDOMEN: Soft, non-tender, non-distended MUSCULOSKELETAL:  No edema; No deformity  SKIN: Warm and dry LOWER EXTREMITIES: no swelling NEUROLOGIC:  Alert and oriented x 3 PSYCHIATRIC:  Normal affect   ASSESSMENT:    1. Essential hypertension   2. S/P CABG x 5   3. Hyperlipidemia with target LDL less than 70   4. Paroxysmal atrial flutter (HCC)   5. Carotid artery stenosis, symptomatic, left - s/p CEA    PLAN:    In order of problems listed above:  Coronary disease stable from that point review on guideline directed medical therapy which I will continue. Essential hypertension: Blood pressure well-controlled. Dyslipidemia I did review K PN which show me data from last January LDL 77 HDL 66 he is taking cholesterol medication Lipitor 10 which I will continue will recheck his fasting lipid profile. Paroxysmal atrial fibrillation/flutter.  Anticoagulated denies having sustained arrhythmia. PVCs will schedule him to have echocardiogram to assess left ventricle ejection fraction to stratify significance of this finding   Medication Adjustments/Labs and Tests Ordered: Current medicines are reviewed at length with the patient today.  Concerns regarding medicines are outlined above.  Orders Placed This Encounter  Procedures   EKG 12-Lead   Medication changes: No orders of the defined types were placed in this encounter.   Signed, Georgeanna Lea, MD, [facility] 11/17/2023 2:51 PM    [facility]

## 2023-11-17 NOTE — Addendum Note (Signed)
 Addended by: Baldo Ash D on: 11/17/2023 03:18 PM   Modules accepted: Orders

## 2023-11-29 DIAGNOSIS — E785 Hyperlipidemia, unspecified: Secondary | ICD-10-CM | POA: Diagnosis not present

## 2023-11-30 LAB — LIPID PANEL
Chol/HDL Ratio: 2.9 ratio (ref 0.0–5.0)
Cholesterol, Total: 201 mg/dL — ABNORMAL HIGH (ref 100–199)
HDL: 70 mg/dL (ref >39)
LDL Chol Calc (NIH): 110 mg/dL — ABNORMAL HIGH (ref 0–99)
Triglycerides: 122 mg/dL (ref 0–149)
VLDL Cholesterol Cal: 21 mg/dL (ref 5–40)

## 2023-12-06 ENCOUNTER — Telehealth: Payer: Self-pay

## 2023-12-06 NOTE — Telephone Encounter (Signed)
 LM to return my call.

## 2023-12-06 NOTE — Telephone Encounter (Signed)
 Pt returning call, requesting cb

## 2023-12-06 NOTE — Telephone Encounter (Signed)
-----   Message from Ralene Burger sent at 12/06/2023  8:31 AM EDT ----- Increase Lipitor to 20 mg daily, cholesterol still not well-controlled, fasting lipid profile, AST ALT 6 weeks

## 2023-12-07 DIAGNOSIS — R0602 Shortness of breath: Secondary | ICD-10-CM | POA: Diagnosis not present

## 2023-12-07 DIAGNOSIS — N320 Bladder-neck obstruction: Secondary | ICD-10-CM | POA: Diagnosis not present

## 2023-12-07 DIAGNOSIS — K5900 Constipation, unspecified: Secondary | ICD-10-CM | POA: Diagnosis not present

## 2023-12-07 DIAGNOSIS — K5641 Fecal impaction: Secondary | ICD-10-CM | POA: Diagnosis not present

## 2023-12-08 ENCOUNTER — Other Ambulatory Visit: Payer: Self-pay

## 2023-12-08 ENCOUNTER — Emergency Department (HOSPITAL_BASED_OUTPATIENT_CLINIC_OR_DEPARTMENT_OTHER): Admission: EM | Admit: 2023-12-08 | Discharge: 2023-12-08 | Discharge status: Absent without leave

## 2023-12-08 ENCOUNTER — Ambulatory Visit

## 2023-12-08 NOTE — Telephone Encounter (Signed)
 LM to return my call.

## 2023-12-09 ENCOUNTER — Telehealth: Payer: Self-pay | Admitting: Cardiology

## 2023-12-09 NOTE — Telephone Encounter (Signed)
 Follow Up:      Patient's wife is returning a call from yesterday, concerning his results.

## 2023-12-09 NOTE — Telephone Encounter (Signed)
 LVM for pt or wife to call regarding lab results

## 2023-12-12 ENCOUNTER — Telehealth: Payer: Self-pay | Admitting: Cardiology

## 2023-12-12 DIAGNOSIS — E785 Hyperlipidemia, unspecified: Secondary | ICD-10-CM

## 2023-12-12 DIAGNOSIS — I6522 Occlusion and stenosis of left carotid artery: Secondary | ICD-10-CM

## 2023-12-12 NOTE — Telephone Encounter (Signed)
 Spoke to patient about rescheduling his echo- rescheduled him for May 13th. Patient says he had received a call more than a week ago about his blood work but didn't remember what he had been told. I informed the patient that we were unable to correctly verify his meds at his last appointment so if he would bring in his medication bottles of everything he is taking he could properly document and go from there on what needed to be increased. Patient says he will come in Wednesday or Thursday this week and bring his medications for us  to verify.  Patient says he's called our call center multiple times but waits forever and hangs up.

## 2023-12-12 NOTE — Telephone Encounter (Signed)
 Called Joseph Mcmillan, the patient's wife and she states that the patient is not taking his atorvastatin . Do you still want him to start off taking atorvastatin  20 mg based on his cholesterol results or another dose?

## 2023-12-13 DIAGNOSIS — Z681 Body mass index (BMI) 19 or less, adult: Secondary | ICD-10-CM | POA: Diagnosis not present

## 2023-12-13 DIAGNOSIS — E782 Mixed hyperlipidemia: Secondary | ICD-10-CM | POA: Diagnosis not present

## 2023-12-13 DIAGNOSIS — K5900 Constipation, unspecified: Secondary | ICD-10-CM | POA: Diagnosis not present

## 2023-12-13 DIAGNOSIS — E538 Deficiency of other specified B group vitamins: Secondary | ICD-10-CM | POA: Diagnosis not present

## 2023-12-13 DIAGNOSIS — Z7901 Long term (current) use of anticoagulants: Secondary | ICD-10-CM | POA: Diagnosis not present

## 2023-12-13 DIAGNOSIS — I480 Paroxysmal atrial fibrillation: Secondary | ICD-10-CM | POA: Diagnosis not present

## 2023-12-13 DIAGNOSIS — R413 Other amnesia: Secondary | ICD-10-CM | POA: Diagnosis not present

## 2023-12-13 MED ORDER — ROSUVASTATIN CALCIUM 10 MG PO TABS: 10.0000 mg | ORAL_TABLET | Freq: Every evening | ORAL | 2 refills | Status: DC

## 2023-12-13 NOTE — Addendum Note (Signed)
 Addended by: Mackynzie Woolford M on: 12/13/2023 02:41 PM   Modules accepted: Orders

## 2023-12-13 NOTE — Telephone Encounter (Signed)
 Spoke to the patient and after verifying the bottle of meds he brought, patient did not have any atorvastatin  on hand. Spoke to Dr. Linnell Richardson advised rather starting Lipitor , Let start him on Crestor  10mg  in the evening. Patient notified and verbalized understanding. I also reminded him of requested Lipid Profile, AST and ALT in  weeks fasting per Dr. Linnell Richardson. Patient aware to fast and to set himself with a reminder because we do not call to remind him. He verbalized understanding.

## 2023-12-14 NOTE — Telephone Encounter (Signed)
 Discussed this inquiry with the patient yesterday in person.

## 2023-12-16 NOTE — Telephone Encounter (Signed)
 Left message for the patient to call back.

## 2023-12-19 ENCOUNTER — Other Ambulatory Visit: Payer: Self-pay

## 2023-12-19 DIAGNOSIS — E785 Hyperlipidemia, unspecified: Secondary | ICD-10-CM

## 2023-12-19 MED ORDER — ATORVASTATIN CALCIUM 20 MG PO TABS: 20.0000 mg | ORAL_TABLET | Freq: Every day | ORAL | 3 refills | Status: DC

## 2023-12-19 NOTE — Telephone Encounter (Signed)
 Called the patient's wife and informed her of Dr. Tonja Fray recommendation below:  "He need to start taking Lipitor  then lets give him Lipitor  20 daily, fasting lipid profile, AST LT 6 weeks"  Patient's wife verbalized understanding and had no further questions at this time.

## 2023-12-23 ENCOUNTER — Telehealth: Payer: Self-pay | Admitting: Gastroenterology

## 2023-12-23 NOTE — Telephone Encounter (Signed)
 Returned call to patient & he stated he was seen at [facility] ED about a week ago for fecal impaction. He was disimpacted & discharged home with lactulose. He is still having some rectal soreness since then with occasional small amounts of bleeding, however bleeding has improved. Bowels are moving occasionally. He prefers to only be seen with Dr. Venice Gillis. Will have Landon Pinion, RN reach out on 5/6 to confirm 7 day hold on 5/13 at 11:00 am with Dr. Venice Gillis. In the meantime, advised patient to start miralax BID & can titrate to once daily if needed, and if symptoms worsen then to reach out to PCP or ED for a sooner evaluation. Pt verbalized all understanding.

## 2023-12-23 NOTE — Telephone Encounter (Signed)
 Inbound call from patients wife stating that patient has been having issues with bowel impaction and was needing to be seen as soon as possible with Dr. Venice Gillis. I advised her that his next appointment was not until July but I could get him in with the PA next week 5/8 at 8:20. Patient was in the background stating he wanted to see Dr. Venice Gillis because he had been a patient of his since he was in [employer] and felt that he could work him in to be seen. They requested I send a message to the nurse to discuss. Please advise.

## 2023-12-27 NOTE — Telephone Encounter (Signed)
 Appointment scheduled for pt at 5/13 at 11:00 am with Dr. Venice Gillis. Pt made aware. Pt verbalized understanding with all questions answered.

## 2023-12-30 DIAGNOSIS — H5201 Hypermetropia, right eye: Secondary | ICD-10-CM | POA: Diagnosis not present

## 2023-12-30 DIAGNOSIS — H35371 Puckering of macula, right eye: Secondary | ICD-10-CM | POA: Diagnosis not present

## 2023-12-30 DIAGNOSIS — H43821 Vitreomacular adhesion, right eye: Secondary | ICD-10-CM | POA: Diagnosis not present

## 2023-12-30 DIAGNOSIS — E119 Type 2 diabetes mellitus without complications: Secondary | ICD-10-CM | POA: Diagnosis not present

## 2023-12-30 DIAGNOSIS — H02831 Dermatochalasis of right upper eyelid: Secondary | ICD-10-CM | POA: Diagnosis not present

## 2023-12-30 DIAGNOSIS — H43811 Vitreous degeneration, right eye: Secondary | ICD-10-CM | POA: Diagnosis not present

## 2023-12-30 DIAGNOSIS — H26491 Other secondary cataract, right eye: Secondary | ICD-10-CM | POA: Diagnosis not present

## 2023-12-30 DIAGNOSIS — H0100A Unspecified blepharitis right eye, upper and lower eyelids: Secondary | ICD-10-CM | POA: Diagnosis not present

## 2023-12-30 DIAGNOSIS — H524 Presbyopia: Secondary | ICD-10-CM | POA: Diagnosis not present

## 2023-12-30 DIAGNOSIS — H02834 Dermatochalasis of left upper eyelid: Secondary | ICD-10-CM | POA: Diagnosis not present

## 2023-12-30 DIAGNOSIS — H268 Other specified cataract: Secondary | ICD-10-CM | POA: Diagnosis not present

## 2023-12-30 DIAGNOSIS — H52201 Unspecified astigmatism, right eye: Secondary | ICD-10-CM | POA: Diagnosis not present

## 2023-12-30 DIAGNOSIS — H04123 Dry eye syndrome of bilateral lacrimal glands: Secondary | ICD-10-CM | POA: Diagnosis not present

## 2023-12-30 DIAGNOSIS — Z7984 Long term (current) use of oral hypoglycemic drugs: Secondary | ICD-10-CM | POA: Diagnosis not present

## 2024-01-03 ENCOUNTER — Ambulatory Visit: Admitting: Gastroenterology

## 2024-01-03 ENCOUNTER — Encounter: Payer: Self-pay | Admitting: Gastroenterology

## 2024-01-03 ENCOUNTER — Ambulatory Visit: Attending: Cardiology

## 2024-01-03 ENCOUNTER — Ambulatory Visit (INDEPENDENT_AMBULATORY_CARE_PROVIDER_SITE_OTHER)
Admission: RE | Admit: 2024-01-03 | Discharge: 2024-01-03 | Disposition: A | Discharge status: Home / Self Care | Source: Ambulatory Visit | Attending: Gastroenterology | Admitting: Gastroenterology

## 2024-01-03 VITALS — BP 144/60 | HR 80 | Ht 67.0 in | Wt 133.5 lb

## 2024-01-03 DIAGNOSIS — K449 Diaphragmatic hernia without obstruction or gangrene: Secondary | ICD-10-CM

## 2024-01-03 DIAGNOSIS — R109 Unspecified abdominal pain: Secondary | ICD-10-CM

## 2024-01-03 DIAGNOSIS — K219 Gastro-esophageal reflux disease without esophagitis: Secondary | ICD-10-CM | POA: Diagnosis not present

## 2024-01-03 DIAGNOSIS — K5909 Other constipation: Secondary | ICD-10-CM

## 2024-01-03 DIAGNOSIS — K5900 Constipation, unspecified: Secondary | ICD-10-CM | POA: Diagnosis not present

## 2024-01-03 DIAGNOSIS — Z7901 Long term (current) use of anticoagulants: Secondary | ICD-10-CM

## 2024-01-03 DIAGNOSIS — Z8673 Personal history of transient ischemic attack (TIA), and cerebral infarction without residual deficits: Secondary | ICD-10-CM | POA: Diagnosis not present

## 2024-01-03 DIAGNOSIS — R634 Abnormal weight loss: Secondary | ICD-10-CM

## 2024-01-03 DIAGNOSIS — I480 Paroxysmal atrial fibrillation: Secondary | ICD-10-CM

## 2024-01-03 MED ORDER — BISACODYL 5 MG PO TBEC: 5.0000 mg | DELAYED_RELEASE_TABLET | Freq: Every day | ORAL | Status: AC

## 2024-01-03 MED ORDER — OMEPRAZOLE 20 MG PO CPDR: 20.0000 mg | DELAYED_RELEASE_CAPSULE | Freq: Every day | ORAL | 3 refills | Status: AC

## 2024-01-03 NOTE — Patient Instructions (Addendum)
 _______________________________________________________  If your blood pressure at your visit was 140/90 or greater, please contact your primary care physician to follow up on this.  _______________________________________________________  If you are age 88 or older, your body mass index should be between 23-30. Your Body mass index is 20.91 kg/m. If this is out of the aforementioned range listed, please consider follow up with your Primary Care Provider. ________________________________________________________  The [place] [facility] providers would like to encourage you to use MYCHART to communicate with providers for non-urgent requests or questions.  Due to long hold times on the telephone, sending your provider a message by [facility] may be a faster and more efficient way to get a response.  Please allow 48 business hours for a response.  Please remember that this is for non-urgent requests.  _______________________________________________________  Your provider has requested that you have an abdominal x ray before leaving today. Please go to the basement floor to our Radiology department for the test.  We have sent the following medications to your pharmacy for you to pick up at your convenience: TAKE: omeprazole  20mg  one capsule 30 minutes prior to breakfast meal each day.  Please purchase the following medications over the counter and take as directed:  START: dulcolax 1 tablet daily at bedtime. START: Miralax 17 grams daily  Follow up in 6 months or sooner if any problems.  Thank you for entrusting me with your care and choosing [facility]  Dr Venice Gillis

## 2024-01-03 NOTE — Progress Notes (Signed)
 Chief Complaint: Constipation  Referring Provider: Dr Trina Fujita       ASSESSMENT AND PLAN;  Assessment:   Chronic constipation, neg colon 03/2014 except for mod sig diverticulosis. Recent stool impaction at [facility] s/p digital disimaction 09/2023 Unexplained Wt loss (resolved). Neg CT AP 03/2023. Nl CBC, CMP, TSH, celiac Abdo pain likely d/t 1 GERD with small HH H/O TIAs, A flutter vs A fib on eliquis   Plan: X ray KUB 2V Omeprazole  20mg  po every day 1/2 hr before breakfast #90, 3RF Dulcolax 1/day at night. Miralax 17g po every day.  FU in 6 months. Earlier if any problems.     HPI:   Joseph Mcmillan is a very pleasant white male with history of long-standing constipation With CAD s/p CABG, CVA/TIA, HTN, HLD, DM2, OSA, H/O melanoma  Discussed the use of AI scribe software for clinical note transcription with the patient, who gave verbal consent to proceed.  History of Present Illness JHOVANY TWISDALE "Joseph Mcmillan" is an 89 year old male who presents with constipation and bowel movement irregularities.  He has been experiencing constipation and bowel movement irregularities for the past three weeks. Initially, he sought care at [facility] where he underwent manual disimpaction due to severe constipation. He describes the procedure as painful, with discomfort persisting for over a week. He experienced significant bleeding prior to the hospital visit, which prompted him to seek emergency care. Post-procedure, he noticed a small amount of bleeding once.  Currently, his bowel movements are still not regular, occurring approximately every three days. He is taking Miralax daily and has recently resumed taking Dulcolax, which he had used prior to the hospital visit. He took two Dulcolax tablets last night, in addition to his daily Miralax, which resulted in a bowel movement this morning. He reports feeling much better today.  He mentions drinking plenty of water to aid his bowel movements. Despite the  irregularity, he feels generally well today and decided to keep his appointment. No daily bowel movements and occasional bleeding associated with bowel movements.    From previous notes:  Had CT Abdo/pelvis with contrast 03/2023 which was negative except for moderate colonic stool burden.  When he took Linzess  in the past he started having diarrhea-that would be another choice as needed  No abdominal pain.  No upper GI problems as long as he takes omeprazole . Rare vomiting No fever chills or night sweats No jaundice dark urine or pale stools  No melena or hematochezia.   Does not want any invasive endoscopic intervention.   Wt Readings from Last 3 Encounters:  01/03/24 133 lb 8 oz (60.6 kg)  11/17/23 135 lb 6.4 oz (61.4 kg)  06/21/23 130 lb 9.6 oz (59.2 kg)    Previous GI workup: CTAP with contrast 04/13/2023 Sigmoid diverticulosis, without evidence of diverticulitis. Mild to moderate colonic stool burden, suggesting mild constipation. Otherwise unremarkable CT abdomen/pelvis.    CTA 10/2022 IMPRESSION: 1. Aortic atherosclerosis with no evidence of aneurysm or dissection. 2. Mild thickening of the walls of the distal esophagus with small hiatal hernia. 3. Atherosclerotic calcification of the renal arteries with moderate-to-severe stenosis of the proximal right renal artery. 4. Multi-vessel coronary artery calcifications. 5. Moderate amount of retained stool in the colon suggesting constipation.   May 2023 US  IMPRESSION: Grossly stable small hypoechoic likely cystic lesion near the head of the pancreas. Recommend continued follow-up with ultrasound in 2 years.   Negative colonoscopy except for sigmoid diverticulosis 2015  Past Medical History:  Diagnosis  Date   Aortic calcification (HCC)    Seen on CT scan   Blepharitis of upper and lower eyelids of both eyes 11/29/2017   CAD (coronary artery disease)    Coronary artery calcification noted on CT scan    Carotid artery stenosis, symptomatic, left - s/p CEA 07/26/2017   Cataract, right eye 1994   Extracted with lens implant   Deprivation amblyopia 08/24/2011   Dermatochalasis 08/24/2011   Essential hypertension 07/26/2017   Fatigue due to excessive exertion 07/26/2017   History of kidney stones    History of left-sided carotid endarterectomy 07/26/2017   History of TIAs 2011   Status post left carotid endarterectomy   Hyperlipidemia    Hyperlipidemia with target LDL less than 70 07/26/2017   Hypertension    Keratoconjunctivitis sicca of both eyes not specified as Sjogren's 11/29/2017   Left-sided carotid artery disease (HCC) 02/2010   Left carotid endarterectomy   Malnutrition of moderate degree 04/10/2018   Mature cataract 08/24/2011   Occlusion and stenosis of carotid artery without mention of cerebral infarction 10/30/2012   Paroxysmal atrial flutter (HCC) 05/19/2018   Pernicious anemia    Posterior capsular opacification of right eye, not obscuring vision 08/24/2011   Posterior vitreous detachment 08/24/2011   Presbyopia of both eyes 03/01/2017   Pseudophakia 08/24/2011   S/P CABG x 5 04/04/2018   Weakness of both lower extremities 07/26/2017     Past Surgical History:  Procedure Laterality Date   CARDIAC CATHETERIZATION  03/30/2018   CAROTID ARTERY ANGIOPLASTY Left    CAROTID ENDARTERECTOMY  03-06-2010   Left CEA   CATARACT EXTRACTION W/ INTRAOCULAR LENS IMPLANT Right    CHOLECYSTECTOMY     COLONOSCOPY  04/17/2014   Moderate sigmoid diverticulosis. Otherwise, normal colonoscopy   CORONARY ARTERY BYPASS GRAFT N/A 04/04/2018   Procedure: CORONARY ARTERY BYPASS GRAFTING (CABG)TIMES 5, USING LEFT  INTERNAL MAMMARY ARTERY AND ENDOSCOPICALLY HARVESTED RIGHT AND LEFT SAPHENOUS VEIN;  Surgeon: Bartley Lightning, MD;  Location: [facility] OR;  Service: Open Heart Surgery;  Laterality: N/A;   ENDOVEIN HARVEST OF GREATER SAPHENOUS VEIN Right 04/04/2018   Procedure: ENDOVEIN HARVEST OF  GREATER SAPHENOUS VEIN;  Surgeon: Bartley Lightning, MD;  Location: [facility] OR;  Service: Open Heart Surgery;  Laterality: Right;   LEFT HEART CATH AND CORONARY ANGIOGRAPHY N/A 03/30/2018   Procedure: LEFT HEART CATH AND CORONARY ANGIOGRAPHY;  Surgeon: Lucendia Rusk, MD;  Location: [facility] INVASIVE CV LAB;  Service: Cardiovascular;  Laterality: N/A;   MELANOMA EXCISION     TEE WITHOUT CARDIOVERSION N/A 04/04/2018   Procedure: TRANSESOPHAGEAL ECHOCARDIOGRAM (TEE);  Surgeon: Bartley Lightning, MD;  Location: [facility] OR;  Service: Open Heart Surgery;  Laterality: N/A;   Family History  Problem Relation Age of Onset   Cancer Mother    Stroke Father    Colon cancer Neg Hx    Esophageal cancer Neg Hx    Stomach cancer Neg Hx    Social History   Tobacco Use   Smoking status: Never   Smokeless tobacco: Never  Vaping Use   Vaping status: Never Used  Substance Use Topics   Alcohol use: No   Drug use: No   Current Outpatient Medications  Medication Sig Dispense Refill   apixaban  (ELIQUIS ) 2.5 MG TABS tablet Take 1 tablet (2.5 mg total) by mouth 2 (two) times daily. 180 tablet 0   atorvastatin  (LIPITOR ) 20 MG tablet Take 1 tablet (20 mg total) by mouth daily. 90 tablet 3   cyanocobalamin (  VITAMIN B12) 1000 MCG tablet Take 1,000 mcg by mouth daily.     metFORMIN  (GLUCOPHAGE -XR) 750 MG 24 hr tablet Take 750 mg by mouth daily.     nitroGLYCERIN  (NITROSTAT ) 0.4 MG SL tablet Place 1 tablet (0.4 mg total) under the tongue as needed for chest pain. 25 tablet 11   omeprazole  (PRILOSEC) 20 MG capsule Take 1 capsule (20 mg total) by mouth daily. Take 30 minutes before breakfast 90 capsule 3   polyethylene glycol powder (GLYCOLAX/MIRALAX) 17 GM/SCOOP powder Take 17 g by mouth daily.     rosuvastatin  (CRESTOR ) 10 MG tablet Take 1 tablet (10 mg total) by mouth every evening. 30 tablet 2   tamsulosin  (FLOMAX ) 0.4 MG CAPS capsule Take 0.4 mg by mouth at bedtime.     No current facility-administered medications for this  visit.   Allergies  Allergen Reactions   Codeine Nausea And Vomiting    Review of Systems:  neg     Physical Exam:    BP (!) 144/60 (BP Location: Left Arm, Patient Position: Sitting, Cuff Size: Normal)   Pulse 80   Ht 5\' 7"  (1.702 m) Comment: height measured without shoes  Wt 133 lb 8 oz (60.6 kg)   BMI 20.91 kg/m  Constitutional:  Well-developed, in no acute distress. Psychiatric: Normal mood and affect. Behavior is normal. HEENT: Pupils normal.  Conjunctivae are normal. No scleral icterus. Cardiovascular: Normal rate, regular rhythm. No edema Pulmonary/chest: Effort normal and breath sounds normal. No wheezing, rales or rhonchi. Abdominal: Soft, nondistended. Nontender. Bowel sounds active throughout. There are no masses palpable. No hepatomegaly.  Rectal exam: Empty rectal vault.  Heme-negative stools. Neurological: Alert and oriented to person place and time. Skin: Skin is warm and dry. No rashes noted.    Latest Ref Rng & Units 03/29/2023   10:16 AM 09/03/2022    8:56 AM 10/03/2018    4:31 PM  CMP  Glucose 70 - 99 mg/dL 161  096  045   BUN 6 - 23 mg/dL 18  12  22    Creatinine 0.40 - 1.50 mg/dL 4.09  8.11  9.14   Sodium 135 - 145 mEq/L 134  140  138   Potassium 3.5 - 5.1 mEq/L 4.4  4.7  4.1   Chloride 96 - 112 mEq/L 98  101  98   CO2 19 - 32 mEq/L 23  27  23    Calcium  8.4 - 10.5 mg/dL 9.4  9.4  9.3   Total Protein 6.0 - 8.3 g/dL 6.8  6.6    Total Bilirubin 0.2 - 1.2 mg/dL 0.6  0.7    Alkaline Phos 39 - 117 U/L 95  80    AST 0 - 37 U/L 17  19    ALT 0 - 53 U/L 15  12       Lajuan Pila, MD   Beecher Bower, MD

## 2024-01-09 ENCOUNTER — Other Ambulatory Visit: Payer: Self-pay

## 2024-01-09 ENCOUNTER — Ambulatory Visit: Payer: Self-pay | Admitting: Gastroenterology

## 2024-01-09 DIAGNOSIS — K5909 Other constipation: Secondary | ICD-10-CM

## 2024-01-20 DIAGNOSIS — L821 Other seborrheic keratosis: Secondary | ICD-10-CM | POA: Diagnosis not present

## 2024-01-20 DIAGNOSIS — L570 Actinic keratosis: Secondary | ICD-10-CM | POA: Diagnosis not present

## 2024-02-09 ENCOUNTER — Ambulatory Visit: Attending: Cardiology

## 2024-02-09 DIAGNOSIS — R0609 Other forms of dyspnea: Secondary | ICD-10-CM

## 2024-02-09 LAB — ECHOCARDIOGRAM COMPLETE
Area-P 1/2: 4.6 cm2
S' Lateral: 2.9 cm

## 2024-02-12 ENCOUNTER — Ambulatory Visit: Payer: Self-pay | Admitting: Cardiology

## 2024-03-02 DIAGNOSIS — L910 Hypertrophic scar: Secondary | ICD-10-CM | POA: Diagnosis not present

## 2024-03-02 DIAGNOSIS — L570 Actinic keratosis: Secondary | ICD-10-CM | POA: Diagnosis not present

## 2024-03-05 ENCOUNTER — Other Ambulatory Visit: Payer: Self-pay | Admitting: Cardiology

## 2024-03-16 DIAGNOSIS — Z6820 Body mass index (BMI) 20.0-20.9, adult: Secondary | ICD-10-CM | POA: Diagnosis not present

## 2024-03-16 DIAGNOSIS — D649 Anemia, unspecified: Secondary | ICD-10-CM | POA: Diagnosis not present

## 2024-03-16 DIAGNOSIS — L444 Infantile papular acrodermatitis [Gianotti-Crosti]: Secondary | ICD-10-CM | POA: Diagnosis not present

## 2024-03-16 DIAGNOSIS — E782 Mixed hyperlipidemia: Secondary | ICD-10-CM | POA: Diagnosis not present

## 2024-03-16 DIAGNOSIS — E1159 Type 2 diabetes mellitus with other circulatory complications: Secondary | ICD-10-CM | POA: Diagnosis not present

## 2024-03-20 ENCOUNTER — Other Ambulatory Visit: Payer: Self-pay

## 2024-03-20 DIAGNOSIS — I4892 Unspecified atrial flutter: Secondary | ICD-10-CM

## 2024-03-20 MED ORDER — APIXABAN 2.5 MG PO TABS
2.5000 mg | ORAL_TABLET | Freq: Two times a day (BID) | ORAL | 3 refills | Status: AC
Start: 2024-03-20 — End: ?

## 2024-03-20 MED ORDER — APIXABAN 2.5 MG PO TABS
2.5000 mg | ORAL_TABLET | Freq: Two times a day (BID) | ORAL | 0 refills | Status: DC
Start: 2024-03-20 — End: 2024-03-20

## 2024-03-20 MED ORDER — APIXABAN 2.5 MG PO TABS: 2.5000 mg | ORAL_TABLET | Freq: Two times a day (BID) | ORAL | 0 refills | Status: AC

## 2024-03-20 NOTE — Telephone Encounter (Signed)
 Eliquis  2.5mg  refill request received. Patient is 89 years old, weight-60.6kg, Crea-0.92 on 03/29/23, Diagnosis-Afib/flutter, and last seen by Dr. Bernie on 11/17/23. Dose is appropriate based on dosing criteria. Will send in refill to requested pharmacy.    PharmD states to continue current eliquis  2.5mg  doseage at this time and send 90 day supply at this time.

## 2024-04-12 DIAGNOSIS — L578 Other skin changes due to chronic exposure to nonionizing radiation: Secondary | ICD-10-CM | POA: Diagnosis not present

## 2024-04-12 DIAGNOSIS — C44519 Basal cell carcinoma of skin of other part of trunk: Secondary | ICD-10-CM | POA: Diagnosis not present

## 2024-04-12 DIAGNOSIS — L570 Actinic keratosis: Secondary | ICD-10-CM | POA: Diagnosis not present

## 2024-04-12 DIAGNOSIS — L720 Epidermal cyst: Secondary | ICD-10-CM | POA: Diagnosis not present

## 2024-04-16 DIAGNOSIS — L820 Inflamed seborrheic keratosis: Secondary | ICD-10-CM | POA: Diagnosis not present

## 2024-04-16 DIAGNOSIS — L570 Actinic keratosis: Secondary | ICD-10-CM | POA: Diagnosis not present

## 2024-04-16 DIAGNOSIS — Z85820 Personal history of malignant melanoma of skin: Secondary | ICD-10-CM | POA: Diagnosis not present

## 2024-04-19 DIAGNOSIS — H02831 Dermatochalasis of right upper eyelid: Secondary | ICD-10-CM | POA: Diagnosis not present

## 2024-04-19 DIAGNOSIS — Z7984 Long term (current) use of oral hypoglycemic drugs: Secondary | ICD-10-CM | POA: Diagnosis not present

## 2024-04-19 DIAGNOSIS — H268 Other specified cataract: Secondary | ICD-10-CM | POA: Diagnosis not present

## 2024-04-19 DIAGNOSIS — H53022 Refractive amblyopia, left eye: Secondary | ICD-10-CM | POA: Diagnosis not present

## 2024-04-19 DIAGNOSIS — H43811 Vitreous degeneration, right eye: Secondary | ICD-10-CM | POA: Diagnosis not present

## 2024-04-19 DIAGNOSIS — H35371 Puckering of macula, right eye: Secondary | ICD-10-CM | POA: Diagnosis not present

## 2024-04-19 DIAGNOSIS — H04123 Dry eye syndrome of bilateral lacrimal glands: Secondary | ICD-10-CM | POA: Diagnosis not present

## 2024-04-19 DIAGNOSIS — H0100A Unspecified blepharitis right eye, upper and lower eyelids: Secondary | ICD-10-CM | POA: Diagnosis not present

## 2024-04-19 DIAGNOSIS — E119 Type 2 diabetes mellitus without complications: Secondary | ICD-10-CM | POA: Diagnosis not present

## 2024-04-19 DIAGNOSIS — H43821 Vitreomacular adhesion, right eye: Secondary | ICD-10-CM | POA: Diagnosis not present

## 2024-04-19 DIAGNOSIS — H26491 Other secondary cataract, right eye: Secondary | ICD-10-CM | POA: Diagnosis not present

## 2024-04-19 DIAGNOSIS — H02834 Dermatochalasis of left upper eyelid: Secondary | ICD-10-CM | POA: Diagnosis not present

## 2024-05-15 ENCOUNTER — Ambulatory Visit: Attending: Cardiology | Admitting: Cardiology

## 2024-05-15 ENCOUNTER — Encounter: Payer: Self-pay | Admitting: Cardiology

## 2024-05-15 VITALS — BP 150/68 | HR 85 | Ht 71.0 in | Wt 133.0 lb

## 2024-05-15 DIAGNOSIS — I10 Essential (primary) hypertension: Secondary | ICD-10-CM

## 2024-05-15 DIAGNOSIS — Z951 Presence of aortocoronary bypass graft: Secondary | ICD-10-CM | POA: Diagnosis not present

## 2024-05-15 DIAGNOSIS — I2510 Atherosclerotic heart disease of native coronary artery without angina pectoris: Secondary | ICD-10-CM

## 2024-05-15 DIAGNOSIS — I6522 Occlusion and stenosis of left carotid artery: Secondary | ICD-10-CM | POA: Diagnosis not present

## 2024-05-15 DIAGNOSIS — E782 Mixed hyperlipidemia: Secondary | ICD-10-CM | POA: Diagnosis not present

## 2024-05-15 MED ORDER — ATORVASTATIN CALCIUM 40 MG PO TABS: 40.0000 mg | ORAL_TABLET | Freq: Every day | ORAL | 3 refills | Status: AC

## 2024-05-15 NOTE — Progress Notes (Unsigned)
 Cardiology Office Note:    Date:  05/15/2024   ID:  Joseph Mcmillan, DOB 1934/01/01, MRN [id number]  PCP:  Fernand Tracey LABOR, MD  Cardiologist:  Lamar Fitch, MD    Referring MD: Fernand Tracey LABOR, MD   No chief complaint on file.   History of Present Illness:    Joseph Mcmillan is a 89 y.o. male  with past medical history significant for coronary artery disease. In 2018 he got carotic endarterectomy done and then in September 2022 he ended up having coronary artery bypass graft done with LIMA and SVG graft x 5. Additional problem include essential hypertension, dyslipidemia. Recently he ended up going to the hospital because he went to lay down in the bed at night still having severe chest tightness. Went to hospital rule out for myocardial infarction a stress test was negative echocardiogram was normal he was discharged home with prescription for Imdur as well as nitroglycerin  as needed. Also dose of Lipitor  has been increased to 40 mg daily.  Comes today to months for follow-up.  He is rushing today because his wife is back in the hospital at the beginning of September she got back surgery was doing quite well but now became confused and she is back in the hospital.  He is doing fine denies have any chest pain tightness squeezing pressure burning chest few weeks ago he celebrated 39th birthday.  Past Medical History:  Diagnosis Date   Aortic calcification    Seen on CT scan   Blepharitis of upper and lower eyelids of both eyes 11/29/2017   CAD (coronary artery disease)    Coronary artery calcification noted on CT scan   Carotid artery stenosis, symptomatic, left - s/p CEA 07/26/2017   Cataract, right eye 1994   Extracted with lens implant   Deprivation amblyopia 08/24/2011   Dermatochalasis 08/24/2011   Essential hypertension 07/26/2017   Fatigue due to excessive exertion 07/26/2017   History of kidney stones    History of left-sided carotid endarterectomy 07/26/2017   History of TIAs 2011    Status post left carotid endarterectomy   Hyperlipidemia    Hyperlipidemia with target LDL less than 70 07/26/2017   Hypertension    Keratoconjunctivitis sicca of both eyes not specified as Sjogren's 11/29/2017   Left-sided carotid artery disease 02/2010   Left carotid endarterectomy   Malnutrition of moderate degree 04/10/2018   Mature cataract 08/24/2011   Occlusion and stenosis of carotid artery without mention of cerebral infarction 10/30/2012   Paroxysmal atrial flutter (HCC) 05/19/2018   Pernicious anemia    Posterior capsular opacification of right eye, not obscuring vision 08/24/2011   Posterior vitreous detachment 08/24/2011   Presbyopia of both eyes 03/01/2017   Pseudophakia 08/24/2011   S/P CABG x 5 04/04/2018   Weakness of both lower extremities 07/26/2017    Past Surgical History:  Procedure Laterality Date   CARDIAC CATHETERIZATION  03/30/2018   CAROTID ARTERY ANGIOPLASTY Left    CAROTID ENDARTERECTOMY  03-06-2010   Left CEA   CATARACT EXTRACTION W/ INTRAOCULAR LENS IMPLANT Right    CHOLECYSTECTOMY     COLONOSCOPY  04/17/2014   Moderate sigmoid diverticulosis. Otherwise, normal colonoscopy   CORONARY ARTERY BYPASS GRAFT N/A 04/04/2018   Procedure: CORONARY ARTERY BYPASS GRAFTING (CABG)TIMES 5, USING LEFT  INTERNAL MAMMARY ARTERY AND ENDOSCOPICALLY HARVESTED RIGHT AND LEFT SAPHENOUS VEIN;  Surgeon: Lucas Dorise POUR, MD;  Location: [facility] OR;  Service: Open Heart Surgery;  Laterality: N/A;   ENDOVEIN HARVEST OF  GREATER SAPHENOUS VEIN Right 04/04/2018   Procedure: ENDOVEIN HARVEST OF GREATER SAPHENOUS VEIN;  Surgeon: Lucas Dorise POUR, MD;  Location: [facility] OR;  Service: Open Heart Surgery;  Laterality: Right;   LEFT HEART CATH AND CORONARY ANGIOGRAPHY N/A 03/30/2018   Procedure: LEFT HEART CATH AND CORONARY ANGIOGRAPHY;  Surgeon: Dann Candyce RAMAN, MD;  Location: [facility] INVASIVE CV LAB;  Service: Cardiovascular;  Laterality: N/A;   MELANOMA EXCISION     TEE WITHOUT CARDIOVERSION  N/A 04/04/2018   Procedure: TRANSESOPHAGEAL ECHOCARDIOGRAM (TEE);  Surgeon: Lucas Dorise POUR, MD;  Location: [facility] OR;  Service: Open Heart Surgery;  Laterality: N/A;    Current Medications: Current Meds  Medication Sig   apixaban  (ELIQUIS ) 2.5 MG TABS tablet Take 1 tablet (2.5 mg total) by mouth 2 (two) times daily.   atorvastatin  (LIPITOR ) 20 MG tablet Take 1 tablet (20 mg total) by mouth daily.   bisacodyl  (DULCOLAX) 5 MG EC tablet Take 1 tablet (5 mg total) by mouth at bedtime.   cyanocobalamin  (VITAMIN B12) 1000 MCG tablet Take 1,000 mcg by mouth daily.   metFORMIN  (GLUCOPHAGE -XR) 750 MG 24 hr tablet Take 750 mg by mouth daily.   nitroGLYCERIN  (NITROSTAT ) 0.4 MG SL tablet Place 1 tablet (0.4 mg total) under the tongue as needed for chest pain.   omeprazole  (PRILOSEC) 20 MG capsule Take 1 capsule (20 mg total) by mouth daily. Take 30 minutes before breakfast   polyethylene glycol powder (GLYCOLAX/MIRALAX) 17 GM/SCOOP powder Take 17 g by mouth daily.   tamsulosin  (FLOMAX ) 0.4 MG CAPS capsule Take 0.4 mg by mouth at bedtime.     Allergies:   Codeine   Social History   Socioeconomic History   Marital status: Married    Spouse name: Not on file   Number of children: 2   Years of education: Not on file   Highest education level: Not on file  Occupational History   Occupation: Retired   Occupation: retired  Tobacco Use   Smoking status: Never   Smokeless tobacco: Never  Vaping Use   Vaping status: Never Used  Substance and Sexual Activity   Alcohol use: No   Drug use: No   Sexual activity: Not on file  Other Topics Concern   Not on file  Social History Narrative   Not on file   Social Drivers of Health   Financial Resource Strain: Low Risk  (03/30/2018)   Overall Financial Resource Strain (CARDIA)    Difficulty of Paying Living Expenses: Not hard at all  Food Insecurity: No Food Insecurity (03/30/2018)   Hunger Vital Sign    Worried About Running Out of Food in the Last Year:  Never true    Ran Out of Food in the Last Year: Never true  Transportation Needs: No Transportation Needs (03/30/2018)   PRAPARE - Administrator, Civil Service (Medical): No    Lack of Transportation (Non-Medical): No  Physical Activity: Inactive (03/30/2018)   Exercise Vital Sign    Days of Exercise per Week: 0 days    Minutes of Exercise per Session: 0 min  Stress: No Stress Concern Present (03/30/2018)   [employer] of Occupational Health - Occupational Stress Questionnaire    Feeling of Stress : Only a little  Social Connections: Moderately Integrated (03/30/2018)   Social Connection and Isolation Panel    Frequency of Communication with Friends and Family: Three times a week    Frequency of Social Gatherings with Friends and Family: Three times a week  Attends Religious Services: More than 4 times per year    Active Member of Clubs or Organizations: No    Attends Banker Meetings: Never    Marital Status: Married     Family History: The patient's family history includes Cancer in his mother; Stroke in his father. There is no history of Colon cancer, Esophageal cancer, or Stomach cancer. ROS:   Please see the history of present illness.    All 14 point review of systems negative except as described per history of present illness  EKGs/Labs/Other Studies Reviewed:         Recent Labs: No results found for requested labs within last 365 days.  Recent Lipid Panel    Component Value Date/Time   CHOL 201 (H) 11/29/2023 0917   TRIG 122 11/29/2023 0917   HDL 70 11/29/2023 0917   CHOLHDL 2.9 11/29/2023 0917   CHOLHDL 2.6 03/31/2018 0846   VLDL 19 03/31/2018 0846   LDLCALC 110 (H) 11/29/2023 0917    Physical Exam:    VS:  BP (!) 150/68   Pulse 85   Ht 5' 11 (1.803 m)   Wt 133 lb (60.3 kg)   SpO2 97%   BMI 18.55 kg/m     Wt Readings from Last 3 Encounters:  05/15/24 133 lb (60.3 kg)  01/03/24 133 lb 8 oz (60.6 kg)  11/17/23 135 lb 6.4  oz (61.4 kg)     GEN:  Well nourished, well developed in no acute distress HEENT: Normal NECK: No JVD; No carotid bruits LYMPHATICS: No lymphadenopathy CARDIAC: RRR, no murmurs, no rubs, no gallops RESPIRATORY:  Clear to auscultation without rales, wheezing or rhonchi  ABDOMEN: Soft, non-tender, non-distended MUSCULOSKELETAL:  No edema; No deformity  SKIN: Warm and dry LOWER EXTREMITIES: no swelling NEUROLOGIC:  Alert and oriented x 3 PSYCHIATRIC:  Normal affect   ASSESSMENT:    1. Coronary artery disease involving native coronary artery of native heart without angina pectoris   2. Carotid artery stenosis, symptomatic, left - s/p CEA   3. Essential hypertension   4. Mixed hyperlipidemia   5. S/P CABG x 5    PLAN:    In order of problems listed above:  Coronary disease stable from that point to ongoing lisinopril  medical therapy which I continue. Dyslipidemia I did review K PN which show me total cholesterol 129 HDL 64 and recently done LDL 111.  I will ask him to double the dose of Lipitor . Essential hypertension blood pressure well-controlled. Carotic arterial disease, I will ask him to repeat carotic ultrasound Paroxysmal atrial fibrillation, anticoagulated which I will continue   Medication Adjustments/Labs and Tests Ordered: Current medicines are reviewed at length with the patient today.  Concerns regarding medicines are outlined above.  No orders of the defined types were placed in this encounter.  Medication changes: No orders of the defined types were placed in this encounter.   Signed, Lamar DOROTHA Fitch, MD, [facility] 05/15/2024 2:04 PM    [facility]

## 2024-05-15 NOTE — Patient Instructions (Addendum)
 Medication Instructions:  STOP Atorvastatin  20 mg   START Atorvastatin  40 mg: Take 1 tablet (40 mg) by mouth daily *If you need a refill on your cardiac medications before your next appointment, please call your pharmacy*  Lab Work:  Your physician recommends that you return for lab work in: 6 weeks You need to have labs done when you are fasting.  You can come Monday through Friday 8:30 am to 12:00 pm and 1:15 to 4:30. You do not need to make an appointment as the order has already been placed. The labs you are going to have done are AST, ALT Lipids.   Testing/Procedures:  None ordered  Follow-Up: At [facility], you and your health needs are our priority.  As part of our continuing mission to provide you with exceptional heart care, our providers are all part of one team.  This team includes your primary Cardiologist (physician) and Advanced Practice Providers or APPs (Physician Assistants and Nurse Practitioners) who all work together to provide you with the care you need, when you need it.  Your next appointment:   6 month(s)  Provider:   Lamar Fitch, MD    We recommend signing up for the patient portal called MyChart.  Sign up information is provided on this After Visit Summary.  MyChart is used to connect with patients for Virtual Visits (Telemedicine).  Patients are able to view lab/test results, encounter notes, upcoming appointments, etc.  Non-urgent messages can be sent to your provider as well.   To learn more about what you can do with MyChart, go to [email or web address].   Other Instructions

## 2024-05-17 DIAGNOSIS — H04123 Dry eye syndrome of bilateral lacrimal glands: Secondary | ICD-10-CM | POA: Diagnosis not present

## 2024-05-17 DIAGNOSIS — H52201 Unspecified astigmatism, right eye: Secondary | ICD-10-CM | POA: Diagnosis not present

## 2024-05-17 DIAGNOSIS — H43821 Vitreomacular adhesion, right eye: Secondary | ICD-10-CM | POA: Diagnosis not present

## 2024-05-17 DIAGNOSIS — H524 Presbyopia: Secondary | ICD-10-CM | POA: Diagnosis not present

## 2024-05-17 DIAGNOSIS — H35371 Puckering of macula, right eye: Secondary | ICD-10-CM | POA: Diagnosis not present

## 2024-05-17 DIAGNOSIS — H268 Other specified cataract: Secondary | ICD-10-CM | POA: Diagnosis not present

## 2024-05-17 DIAGNOSIS — H26491 Other secondary cataract, right eye: Secondary | ICD-10-CM | POA: Diagnosis not present

## 2024-05-17 DIAGNOSIS — H5201 Hypermetropia, right eye: Secondary | ICD-10-CM | POA: Diagnosis not present

## 2024-10-04 ENCOUNTER — Ambulatory Visit: Admitting: Gastroenterology
